# Patient Record
Sex: Male | Born: 1970 | Race: Black or African American | Hispanic: No | Marital: Single | Smoking: Never smoker
Health system: Southern US, Community
[De-identification: ages and names within clinical notes are randomized; demographics above are authoritative.]

## PROBLEM LIST (undated history)

## (undated) HISTORY — PX: JOINT REPLACEMENT: SHX530

---

## 2008-08-09 ENCOUNTER — Emergency Department: Payer: Self-pay | Admitting: Emergency Medicine

## 2008-08-13 ENCOUNTER — Emergency Department: Payer: Self-pay | Admitting: Emergency Medicine

## 2008-09-07 ENCOUNTER — Emergency Department: Payer: Self-pay | Admitting: Emergency Medicine

## 2015-12-03 ENCOUNTER — Emergency Department: Payer: No Typology Code available for payment source

## 2015-12-03 ENCOUNTER — Emergency Department
Admission: EM | Admit: 2015-12-03 | Discharge: 2015-12-03 | Disposition: A | Discharge status: Home / Self Care | Payer: No Typology Code available for payment source | Attending: Emergency Medicine | Admitting: Emergency Medicine

## 2015-12-03 DIAGNOSIS — S8002XA Contusion of left knee, initial encounter: Secondary | ICD-10-CM | POA: Diagnosis not present

## 2015-12-03 DIAGNOSIS — Y9289 Other specified places as the place of occurrence of the external cause: Secondary | ICD-10-CM | POA: Insufficient documentation

## 2015-12-03 DIAGNOSIS — Y9389 Activity, other specified: Secondary | ICD-10-CM | POA: Diagnosis not present

## 2015-12-03 DIAGNOSIS — S39012A Strain of muscle, fascia and tendon of lower back, initial encounter: Secondary | ICD-10-CM | POA: Diagnosis not present

## 2015-12-03 DIAGNOSIS — Y999 Unspecified external cause status: Secondary | ICD-10-CM | POA: Insufficient documentation

## 2015-12-03 DIAGNOSIS — M25562 Pain in left knee: Secondary | ICD-10-CM | POA: Diagnosis present

## 2015-12-03 MED ORDER — DIAZEPAM 2 MG PO TABS: 2.0000 mg | ORAL_TABLET | Freq: Three times a day (TID) | ORAL | Status: AC | PRN

## 2015-12-03 MED ORDER — ACETAMINOPHEN 500 MG PO TABS
1000.0000 mg | ORAL_TABLET | Freq: Once | ORAL | Status: AC
Start: 2015-12-03 — End: 2015-12-03
  Administered 2015-12-03: 1000 mg via ORAL
  Filled 2015-12-03: qty 2

## 2015-12-03 MED ORDER — IBUPROFEN 800 MG PO TABS: 800.0000 mg | ORAL_TABLET | Freq: Three times a day (TID) | ORAL | Status: DC
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
## 2015-12-03 NOTE — Discharge Instructions (Signed)
Ice or moist heat to muscle areas as needed for pain and discomfort. Begin taking ibuprofen for pain and inflammation. Diazepam 2 mg is for muscle spasms every 8 hours for 3 days. This medication could cause drowsiness and is not to be taken while driving. Follow-up with [facility] clinic if any continued problems.

## 2016-08-25 ENCOUNTER — Emergency Department
Admission: EM | Admit: 2016-08-25 | Discharge: 2016-08-25 | Disposition: A | Discharge status: Home / Self Care | Payer: No Typology Code available for payment source | Attending: Emergency Medicine | Admitting: Emergency Medicine

## 2016-08-25 ENCOUNTER — Emergency Department: Payer: No Typology Code available for payment source

## 2016-08-25 ENCOUNTER — Encounter: Payer: Self-pay | Admitting: Emergency Medicine

## 2016-08-25 DIAGNOSIS — Y9389 Activity, other specified: Secondary | ICD-10-CM | POA: Insufficient documentation

## 2016-08-25 DIAGNOSIS — Y92410 Unspecified street and highway as the place of occurrence of the external cause: Secondary | ICD-10-CM | POA: Insufficient documentation

## 2016-08-25 DIAGNOSIS — M545 Low back pain: Secondary | ICD-10-CM | POA: Insufficient documentation

## 2016-08-25 DIAGNOSIS — S3992XA Unspecified injury of lower back, initial encounter: Secondary | ICD-10-CM | POA: Diagnosis present

## 2016-08-25 DIAGNOSIS — Y999 Unspecified external cause status: Secondary | ICD-10-CM | POA: Diagnosis not present

## 2016-08-25 DIAGNOSIS — M25511 Pain in right shoulder: Secondary | ICD-10-CM | POA: Diagnosis not present

## 2016-08-25 MED ORDER — CYCLOBENZAPRINE HCL 5 MG PO TABS: 5.0000 mg | ORAL_TABLET | Freq: Three times a day (TID) | ORAL | 0 refills | Status: AC | PRN

## 2016-08-25 MED ORDER — MELOXICAM 7.5 MG PO TABS: 7.5000 mg | ORAL_TABLET | Freq: Every day | ORAL | 1 refills | Status: AC
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
## 2016-08-25 NOTE — ED Notes (Signed)
Patient transported to X-ray 

## 2017-03-08 ENCOUNTER — Emergency Department: Payer: No Typology Code available for payment source

## 2017-03-08 ENCOUNTER — Emergency Department
Admission: EM | Admit: 2017-03-08 | Discharge: 2017-03-09 | Disposition: A | Discharge status: Home / Self Care | Payer: No Typology Code available for payment source | Attending: Emergency Medicine | Admitting: Emergency Medicine

## 2017-03-08 ENCOUNTER — Encounter: Payer: Self-pay | Admitting: Emergency Medicine

## 2017-03-08 DIAGNOSIS — M79631 Pain in right forearm: Secondary | ICD-10-CM | POA: Diagnosis not present

## 2017-03-08 DIAGNOSIS — T148XXA Other injury of unspecified body region, initial encounter: Secondary | ICD-10-CM | POA: Diagnosis not present

## 2017-03-08 DIAGNOSIS — D171 Benign lipomatous neoplasm of skin and subcutaneous tissue of trunk: Secondary | ICD-10-CM | POA: Diagnosis not present

## 2017-03-08 DIAGNOSIS — S161XXA Strain of muscle, fascia and tendon at neck level, initial encounter: Secondary | ICD-10-CM | POA: Insufficient documentation

## 2017-03-08 DIAGNOSIS — M25561 Pain in right knee: Secondary | ICD-10-CM | POA: Diagnosis not present

## 2017-03-08 DIAGNOSIS — M25572 Pain in left ankle and joints of left foot: Secondary | ICD-10-CM | POA: Diagnosis not present

## 2017-03-08 DIAGNOSIS — Y939 Activity, unspecified: Secondary | ICD-10-CM | POA: Diagnosis not present

## 2017-03-08 DIAGNOSIS — Y92410 Unspecified street and highway as the place of occurrence of the external cause: Secondary | ICD-10-CM | POA: Diagnosis not present

## 2017-03-08 DIAGNOSIS — Y999 Unspecified external cause status: Secondary | ICD-10-CM | POA: Diagnosis not present

## 2017-03-08 DIAGNOSIS — T1490XA Injury, unspecified, initial encounter: Secondary | ICD-10-CM

## 2017-03-08 DIAGNOSIS — S0990XA Unspecified injury of head, initial encounter: Secondary | ICD-10-CM | POA: Diagnosis present

## 2017-03-08 DIAGNOSIS — T07XXXA Unspecified multiple injuries, initial encounter: Secondary | ICD-10-CM

## 2017-03-08 LAB — BASIC METABOLIC PANEL
Anion gap: 8 (ref 5–15)
BUN: 17 mg/dL (ref 6–20)
CHLORIDE: 104 mmol/L (ref 101–111)
CO2: 27 mmol/L (ref 22–32)
CREATININE: 0.91 mg/dL (ref 0.61–1.24)
Calcium: 8.9 mg/dL (ref 8.9–10.3)
GFR calc non Af Amer: >60 mL/min (ref >60)
Glucose, Bld: 113 mg/dL — ABNORMAL HIGH (ref 65–99)
POTASSIUM: 3.7 mmol/L (ref 3.5–5.1)
SODIUM: 139 mmol/L (ref 135–145)

## 2017-03-08 LAB — CBC WITH DIFFERENTIAL/PLATELET
BASOS PCT: 1 %
Basophils Absolute: 0.1 10*3/uL (ref 0–0.1)
EOS ABS: 0.3 10*3/uL (ref 0–0.7)
Eosinophils Relative: 3 %
HCT: 38.5 % — ABNORMAL LOW (ref 40.0–52.0)
HEMOGLOBIN: 13.6 g/dL (ref 13.0–18.0)
Lymphocytes Relative: 29 %
Lymphs Abs: 3.2 10*3/uL (ref 1.0–3.6)
MCH: 31.1 pg (ref 26.0–34.0)
MCHC: 35.4 g/dL (ref 32.0–36.0)
MCV: 88 fL (ref 80.0–100.0)
MONOS PCT: 9 %
Monocytes Absolute: 0.9 10*3/uL (ref 0.2–1.0)
NEUTROS ABS: 6.3 10*3/uL (ref 1.4–6.5)
NEUTROS PCT: 58 %
Platelets: 254 10*3/uL (ref 150–440)
RBC: 4.37 MIL/uL — ABNORMAL LOW (ref 4.40–5.90)
RDW: 13.3 % (ref 11.5–14.5)
WBC: 10.8 10*3/uL — ABNORMAL HIGH (ref 3.8–10.6)

## 2017-03-08 MED ORDER — ONDANSETRON HCL 4 MG/2ML IJ SOLN
4.0000 mg | Freq: Once | INTRAMUSCULAR | Status: AC
  Administered 2017-03-08: 4 mg via INTRAVENOUS
  Filled 2017-03-08: qty 2

## 2017-03-08 MED ORDER — MORPHINE SULFATE (PF) 4 MG/ML IV SOLN
4.0000 mg | Freq: Once | INTRAVENOUS | Status: AC
  Administered 2017-03-08: 4 mg via INTRAVENOUS
  Filled 2017-03-08: qty 1

## 2017-03-08 MED ORDER — SODIUM CHLORIDE 0.9 % IV BOLUS (SEPSIS)
1000.0000 mL | Freq: Once | INTRAVENOUS | Status: AC
Start: 2017-03-08 — End: 2017-03-09
  Administered 2017-03-08: 1000 mL via INTRAVENOUS
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
## 2017-03-09 ENCOUNTER — Encounter: Payer: Self-pay | Admitting: Radiology

## 2017-03-09 MED ORDER — OXYCODONE-ACETAMINOPHEN 5-325 MG PO TABS: 1.0000 | ORAL_TABLET | ORAL | 0 refills | Status: AC | PRN

## 2017-03-09 MED ORDER — IBUPROFEN 800 MG PO TABS: 800.0000 mg | ORAL_TABLET | Freq: Three times a day (TID) | ORAL | 0 refills | Status: AC | PRN

## 2017-03-09 MED ORDER — IOPAMIDOL (ISOVUE-300) INJECTION 61%
100.0000 mL | Freq: Once | INTRAVENOUS | Status: AC | PRN
  Administered 2017-03-09: 100 mL via INTRAVENOUS
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
## 2017-03-09 NOTE — Discharge Instructions (Signed)
1. Take medicines as needed for pain (Motrin/Percocet #15). 2. Apply ice to affected area several times daily. 3. Return to the ER for worsening symptoms, persistent vomiting, difficulty breathing or other concerns.
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
## 2021-01-05 ENCOUNTER — Ambulatory Visit
Admission: EM | Admit: 2021-01-05 | Discharge: 2021-01-05 | Disposition: A | Discharge status: Home / Self Care | Payer: Self-pay

## 2021-01-05 ENCOUNTER — Other Ambulatory Visit: Payer: Self-pay

## 2021-01-05 DIAGNOSIS — L03115 Cellulitis of right lower limb: Secondary | ICD-10-CM

## 2021-01-05 DIAGNOSIS — I739 Peripheral vascular disease, unspecified: Secondary | ICD-10-CM

## 2021-01-05 DIAGNOSIS — L97909 Non-pressure chronic ulcer of unspecified part of unspecified lower leg with unspecified severity: Secondary | ICD-10-CM

## 2021-01-05 MED ORDER — CEPHALEXIN 500 MG PO CAPS: 500.0000 mg | ORAL_CAPSULE | Freq: Four times a day (QID) | ORAL | 0 refills | Status: AC

## 2021-01-05 NOTE — ED Triage Notes (Signed)
Pt sts he have a rash on R ankle. sts the area started out dark around ankle. He was prescribed an abx and cream from [facility] for the area . sts since he has been using the cream the area has began to look worse. There is and open sore on the ankle.

## 2021-01-05 NOTE — Discharge Instructions (Signed)
Take the antibiotic as directed.  Keep your wound clean and dry.  Wash it gently twice a day with soap and water.  Apply an antibiotic cream twice a day.    Schedule an appointment at the wound care center listed below as soon as possible.    Establish a primary care provider as soon as possible.  Assistance from [place] has been requested.

## 2021-01-05 NOTE — ED Provider Notes (Signed)
[facility]    CSN: [id number] Arrival date & time: 01/05/21  1120      History   Chief Complaint Chief Complaint  Patient presents with  . Rash    HPI [name] is a 50 y.o. male.   Patient presents with eye pain wounds on his right lower leg x 2 weeks.  He states the area has had dark skin for several months.  Patient had an appointment with e-visit with [facility] on 12/30/2020 and again today; treated with cephalexin and triamcinolone cream.  He denies fever, chills, numbness, weakness, paresthesias, or other symptoms.  He denies pertinent medical history.  The history is provided by the patient.    History reviewed. No pertinent past medical history.  There are no problems to display for this patient.   Past Surgical History:  Procedure Laterality Date  . JOINT REPLACEMENT         Home Medications    Prior to Admission medications   Medication Sig Start Date End Date Taking? Authorizing Provider  cephALEXin (KEFLEX) 500 MG capsule Take 1 capsule (500 mg total) by mouth 4 (four) times daily for 5 days. 01/05/21 01/10/21 Yes Sharion Balloon, NP  diazepam (VALIUM) 2 MG tablet Take 1 tablet (2 mg total) by mouth every 8 (eight) hours as needed for muscle spasms. 12/03/15   Johnn Hai, PA-C  ibuprofen (ADVIL,MOTRIN) 800 MG tablet Take 1 tablet (800 mg total) by mouth every 8 (eight) hours as needed for moderate pain. 03/09/17   Paulette Blanch, MD  oxyCODONE-acetaminophen (ROXICET) 5-325 MG tablet Take 1 tablet by mouth every 4 (four) hours as needed for severe pain. 03/09/17   Paulette Blanch, MD    Family History No family history on file.  Social History Social History   Tobacco Use  . Smoking status: Never Smoker  . Smokeless tobacco: Never Used  Substance Use Topics  . Alcohol use: No  . Drug use: No     Allergies   Patient has no known allergies.   Review of Systems Review of Systems  Constitutional: Negative for chills and fever.   Respiratory: Negative for cough and shortness of breath.   Cardiovascular: Negative for chest pain and palpitations.  Gastrointestinal: Negative for abdominal pain and vomiting.  Musculoskeletal: Negative for arthralgias and gait problem.  Skin: Positive for color change and wound.  Neurological: Negative for weakness and numbness.  All other systems reviewed and are negative.    Physical Exam Triage Vital Signs ED Triage Vitals  Enc Vitals Group     BP 01/05/21 1129 139/78     Pulse Rate 01/05/21 1129 70     Resp 01/05/21 1129 18     Temp 01/05/21 1129 97.9 F (36.6 C)     Temp Source 01/05/21 1129 Oral     SpO2 01/05/21 1129 98 %     Weight 01/05/21 1130 238 lb (108 kg)     Height 01/05/21 1130 6\' 6"  (1.981 m)     Head Circumference --      Peak Flow --      Pain Score 01/05/21 1130 4     Pain Loc --      Pain Edu? --      Excl. in [place]? --    No data found.  Updated Vital Signs BP 139/78   Pulse 70   Temp 97.9 F (36.6 C) (Oral)   Resp 18   Ht 6\' 6"  (1.981  m)   Wt 238 lb (108 kg)   SpO2 98%   BMI 27.50 kg/m   Visual Acuity Right Eye Distance:   Left Eye Distance:   Bilateral Distance:    Right Eye Near:   Left Eye Near:    Bilateral Near:     Physical Exam Vitals and nursing note reviewed.  Constitutional:      General: He is not in acute distress.    Appearance: He is well-developed.  HENT:     Head: Normocephalic and atraumatic.     Mouth/Throat:     Mouth: Mucous membranes are moist.  Eyes:     Conjunctiva/sclera: Conjunctivae normal.  Cardiovascular:     Rate and Rhythm: Normal rate and regular rhythm.     Heart sounds: Normal heart sounds.  Pulmonary:     Effort: Pulmonary effort is normal. No respiratory distress.     Breath sounds: Normal breath sounds.  Abdominal:     Palpations: Abdomen is soft.     Tenderness: There is no abdominal tenderness.  Musculoskeletal:        General: Normal range of motion.     Cervical back: Neck  supple.  Skin:    General: Skin is warm and dry.     Capillary Refill: Capillary refill takes less than 2 seconds.     Findings: Lesion present.  Neurological:     General: No focal deficit present.     Mental Status: He is alert and oriented to person, place, and time.     Sensory: No sensory deficit.     Motor: No weakness.     Gait: Gait normal.  Psychiatric:        Mood and Affect: Mood normal.        Behavior: Behavior normal.        UC Treatments / Results  Labs (all labs ordered are listed, but only abnormal results are displayed) Labs Reviewed - No data to display  EKG   Radiology No results found.  Procedures Procedures (including critical care time)  Medications Ordered in UC Medications - No data to display  Initial Impression / Assessment and Plan / UC Course  I have reviewed the triage vital signs and the nursing notes.  Pertinent labs & imaging results that were available during my care of the patient were reviewed by me and considered in my medical decision making (see chart for details).   Cellulitis of right lower leg, peripheral vascular disease with ulceration.  Ambulatory referral to wound care center made.  Extending cephalexin for an additional 5 days.  Wound care instructions discussed.  Assistance from [facility] [place] with establishing a PCP requested.  Education provided on cellulitis and PVD.  Patient agrees to plan of care.   Final Clinical Impressions(s) / UC Diagnoses   Final diagnoses:  Cellulitis of leg, right  Peripheral vascular disease of lower extremity with ulceration [facility])     Discharge Instructions     Take the antibiotic as directed.  Keep your wound clean and dry.  Wash it gently twice a day with soap and water.  Apply an antibiotic cream twice a day.    Schedule an appointment at the wound care center listed below as soon as possible.    Establish a primary care provider as soon as possible.  Assistance from [place]  has been requested.        ED Prescriptions    Medication Sig Dispense Auth. Provider   cephALEXin [employer])  500 MG capsule Take 1 capsule (500 mg total) by mouth 4 (four) times daily for 5 days. 20 capsule Sharion Balloon, NP     PDMP not reviewed this encounter.   Sharion Balloon, NP 01/05/21 [id number]

## 2021-01-12 ENCOUNTER — Encounter (HOSPITAL_COMMUNITY): Payer: Self-pay

## 2021-01-12 ENCOUNTER — Other Ambulatory Visit: Payer: Self-pay

## 2021-01-12 ENCOUNTER — Encounter: Payer: Self-pay | Attending: Physician Assistant | Admitting: Physician Assistant

## 2021-01-12 DIAGNOSIS — I872 Venous insufficiency (chronic) (peripheral): Secondary | ICD-10-CM | POA: Insufficient documentation

## 2021-01-12 DIAGNOSIS — L97812 Non-pressure chronic ulcer of other part of right lower leg with fat layer exposed: Secondary | ICD-10-CM | POA: Insufficient documentation

## 2021-01-12 DIAGNOSIS — I10 Essential (primary) hypertension: Secondary | ICD-10-CM | POA: Insufficient documentation

## 2021-01-12 NOTE — Progress Notes (Signed)
[name], [name] ([id number]) Visit Report for 01/12/2021 Abuse/Suicide Risk Screen Details Patient Name: [name], [name]. Date of Service: 01/12/2021 8:30 AM Medical Record Number: [id number] Patient Account Number: [id number] Date of Birth/Sex: March 13, 1971 (50 y.o. M) Treating RN: Dolan Amen Primary Care Janaki Exley: PATIENT, NO Other Clinician: Referring Shada Nienaber: Barkley Boards Treating [place] Whedbee/Extender: Skipper Cliche in Treatment: 0 Abuse/Suicide Risk Screen Items Answer ABUSE RISK SCREEN: Has anyone close to you tried to hurt or harm you recentlyo No Do you feel uncomfortable with anyone in your familyo No Has anyone forced you do things that you didnot want to doo No Electronic Signature(s) Signed: 01/12/2021 5:00:05 PM By: Georges Mouse, Minus Breeding RN Entered By: Georges Mouse, Kenia on 01/12/2021 08:53:21 [place], [place] ([id number]) -------------------------------------------------------------------------------- Activities of Daily Living Details Patient Name: [name], [name] Date of Service: 01/12/2021 8:30 AM Medical Record Number: [id number] Patient Account Number: [id number] Date of Birth/Sex: 1971-06-07 (50 y.o. M) Treating RN: Dolan Amen Primary Care Seth Higginbotham: PATIENT, NO Other Clinician: Referring Sharee Sturdy: Barkley Boards Treating Crosley Stejskal/Extender: Skipper Cliche in Treatment: 0 Activities of Daily Living Items Answer Activities of Daily Living (Please select one for each item) Drive Automobile Completely Able Take Medications Completely Able Use Telephone Completely Able Care for Appearance Completely Able Use Toilet Completely Able Bath / Shower Completely Able Dress Self Completely Able Feed Self Completely Able Walk Completely Able Get In / Out Bed Completely Able Housework Completely Able Prepare Meals Completely Able Handle Money Completely Able Shop for Self Completely Able Electronic Signature(s) Signed: 01/12/2021 5:00:05 PM By: Georges Mouse, Minus Breeding RN Entered By: Georges Mouse, Minus Breeding on 01/12/2021 08:53:37 [place], [place] Carlean Jews ([id number]) -------------------------------------------------------------------------------- Education Screening Details Patient Name: Nicolas Cousin L. Date of Service: 01/12/2021 8:30 AM Medical Record Number: [id number] Patient Account Number: [id number] Date of Birth/Sex: 12-12-1970 (50 y.o. M) Treating RN: Dolan Amen Primary Care Torri Michalski: PATIENT, NO Other Clinician: Referring Dejon Lukas: Barkley Boards Treating Ragnar Waas/Extender: Skipper Cliche in Treatment: 0 Primary Learner Assessed: Patient Learning Preferences/Education Level/Primary Language Learning Preference: Explanation, Demonstration Highest Education Level: High School Preferred Language: English Cognitive Barrier Language Barrier: No Translator Needed: No Memory Deficit: No Emotional Barrier: No Cultural/Religious Beliefs Affecting Medical Care: No Physical Barrier Impaired Vision: No Impaired Hearing: No Decreased Hand dexterity: No Knowledge/Comprehension Knowledge Level: Medium Comprehension Level: Medium Ability to understand written instructions: Medium Ability to understand verbal instructions: Medium Motivation Anxiety Level: Calm Cooperation: Cooperative Education Importance: Acknowledges Need Interest in Health Problems: Asks Questions Perception: Coherent Willingness to Engage in Self-Management High Activities: Readiness to Engage in Self-Management High Activities: Electronic Signature(s) Signed: 01/12/2021 5:00:05 PM By: Georges Mouse, Minus Breeding RN Entered By: Georges Mouse, Minus Breeding on 01/12/2021 08:54:13 [name] ([id number]) -------------------------------------------------------------------------------- Fall Risk Assessment Details Patient Name: Nicolas Cousin L. Date of Service: 01/12/2021 8:30 AM Medical Record Number: [id number] Patient Account Number: [id number] Date of Birth/Sex:  July 26, 1971 (50 y.o. M) Treating RN: Dolan Amen Primary Care Rande Roylance: PATIENT, NO Other Clinician: Referring Nijel Flink: Barkley Boards Treating Keatyn Luck/Extender: Skipper Cliche in Treatment: 0 Fall Risk Assessment Items Have you had 2 or more falls in the last 12 monthso 0 No Have you had any fall that resulted in injury in the last 12 monthso 0 No FALLS RISK SCREEN History of falling - immediate or within 3 months 0 No Secondary diagnosis (Do you have 2 or more medical diagnoseso) 0 No Ambulatory aid None/bed rest/wheelchair/nurse 0 Yes Crutches/cane/walker 0 No Furniture 0 No Intravenous therapy Access/Saline/Heparin Lock 0 No Gait/Transferring Normal/ bed rest/ wheelchair  0 Yes Weak (short steps with or without shuffle, stooped but able to lift head while walking, may 0 No seek support from furniture) Impaired (short steps with shuffle, may have difficulty arising from chair, head down, impaired 0 No balance) Mental Status Oriented to own ability 0 Yes Electronic Signature(s) Signed: 01/12/2021 5:00:05 PM By: Georges Mouse, Minus Breeding RN Entered By: Georges Mouse, Minus Breeding on 01/12/2021 08:54:24 [name], [name] ([id number]) -------------------------------------------------------------------------------- Foot Assessment Details Patient Name: Nicolas Cousin L. Date of Service: 01/12/2021 8:30 AM Medical Record Number: [id number] Patient Account Number: [id number] Date of Birth/Sex: 02-02-1971 (50 y.o. M) Treating RN: Dolan Amen Primary Care [name]: PATIENT, NO Other Clinician: Referring [name]: Barkley Boards Treating Laniah Grimm/Extender: Skipper Cliche in Treatment: 0 Foot Assessment Items Site Locations + = Sensation present, - = Sensation absent, C = Callus, U = Ulcer R = Redness, W = Warmth, M = Maceration, PU = Pre-ulcerative lesion F = Fissure, S = Swelling, D = Dryness Assessment Right: Left: Other Deformity: No No Prior Foot Ulcer: No No Prior Amputation:  No No Charcot Joint: No No Ambulatory Status: Ambulatory Without Help Gait: Steady Electronic Signature(s) Signed: 01/12/2021 5:00:05 PM By: Georges Mouse, Minus Breeding RN Entered By: Georges Mouse, Minus Breeding on 01/12/2021 08:54:44 Thayer, [place] ([id number]) -------------------------------------------------------------------------------- Nutrition Risk Screening Details Patient Name: Nicolas Cousin L. Date of Service: 01/12/2021 8:30 AM Medical Record Number: [id number] Patient Account Number: [id number] Date of Birth/Sex: [birth year] (50 y.o. M) Treating RN: Dolan Amen Primary Care Abrielle Finck: PATIENT, NO Other Clinician: Referring Quade Ramirez: Barkley Boards Treating Minoru Chap/Extender: Skipper Cliche in Treatment: 0 Height (in): 78 Weight (lbs): 249 Body Mass Index (BMI): 28.8 Nutrition Risk Screening Items Score Screening NUTRITION RISK SCREEN: I have an illness or condition that made me change the kind and/or amount of food I eat 0 No I eat fewer than two meals per day 0 No I eat few fruits and vegetables, or milk products 0 No I have three or more drinks of beer, liquor or wine almost every day 0 No I have tooth or mouth problems that make it hard for me to eat 0 No I don't always have enough money to buy the food I need 0 No I eat alone most of the time 0 No I take three or more different prescribed or over-the-counter drugs a day 0 No Without wanting to, I have lost or gained 10 pounds in the last six months 0 No I am not always physically able to shop, cook and/or feed myself 0 No Nutrition Protocols Good Risk Protocol 0 No interventions needed Moderate Risk Protocol High Risk Proctocol Risk Level: Good Risk Score: 0 Electronic Signature(s) Signed: 01/12/2021 5:00:05 PM By: Georges Mouse, Minus Breeding RN Entered By: Georges Mouse, Minus Breeding on 01/12/2021 08:54:33

## 2021-01-15 NOTE — Progress Notes (Signed)
[name], Rc L. ([id number]) Visit Report for 01/12/2021 Chief Complaint Document Details Patient Name: [name], [name] Date of Service: 01/12/2021 8:30 AM Medical Record Number: [id number] Patient Account Number: [id number] Date of Birth/Sex: [birth year] (50 y.o. M) Treating RN: Yevonne PaxEpps, Carrie Primary Care Provider: PATIENT, NO Other Clinician: Referring Provider: Wendee Beaversate, Kelly Treating Provider/Extender: Rowan BlaseStone, Chrishon Martino Weeks in Treatment: 0 Information Obtained from: Patient Chief Complaint Right LE Ulcer Electronic Signature(s) Signed: 01/12/2021 9:28:38 AM By: Lenda KelpStone III, Rhylee Pucillo PA-C Entered By: Lenda KelpStone III, Kennieth Plotts on 01/12/2021 08:65:[id number]:28:38 [name], Edker L. ([id number]) -------------------------------------------------------------------------------- HPI Details Patient Name: [name], Hitesh L. Date of Service: 01/12/2021 8:30 AM Medical Record Number: [id number] Patient Account Number: [id number] Date of Birth/Sex: [birth year] (50 y.o. M) Treating RN: Yevonne PaxEpps, Carrie Primary Care Provider: PATIENT, NO Other Clinician: Referring Provider: Wendee Beaversate, Kelly Treating Provider/Extender: Rowan BlaseStone, Danta Baumgardner Weeks in Treatment: 0 History of Present Illness HPI Description: 01/12/2021 upon evaluation today patient appears to be doing somewhat poorly in regard to a wound on his right medial lower leg. Currently he tells me that this has been going on since around the beginning of April. He has done a telehealth visit with a physician who initially gave him a triamcinolone cream along with Keflex for 5 days. Subsequently he ended up being seen at walk-in urgent care where they also given Keflex for 7 days that seem to be doing better for him. Overall he tells me that things have improved from the standpoint of redness. Fortunately there does not appear to be any signs of systemic infection to be honest right now even locally I do not see any evidence of infection right now. He does have 1 main wound with some scattering areas that  look like they may have been small ulcerations but again for the most part these have filled in. This does look almost to be more of a vasculitis type scenario based on what I am seeing. Patient does have chronic venous insufficiency but is not wearing any compression even though it sounds like he has been told before he should. Electronic Signature(s) Signed: 01/12/2021 5:13:30 PM By: Lenda KelpStone III, Khamari Yousuf PA-C Entered By: Lenda KelpStone III, Yuchen Fedor on 01/12/2021 17:13:30 [name], Masayuki L. ([id number]) -------------------------------------------------------------------------------- Physical Exam Details Patient Name: [name], [name] Date of Service: 01/12/2021 8:30 AM Medical Record Number: [id number] Patient Account Number: [id number] Date of Birth/Sex: [birth year] (50 y.o. M) Treating RN: Yevonne PaxEpps, Carrie Primary Care Provider: PATIENT, NO Other Clinician: Referring Provider: Wendee Beaversate, Kelly Treating Provider/Extender: Rowan BlaseStone, Geral Tuch Weeks in Treatment: 0 Constitutional sitting or standing blood pressure is within target range for patient.. pulse regular and within target range for patient.Marland Kitchen. respirations regular, non- labored and within target range for patient.Marland Kitchen. temperature within target range for patient.. Well-nourished and well-hydrated in no acute distress. Eyes conjunctiva clear no eyelid edema noted. pupils equal round and reactive to light and accommodation. Ears, Nose, Mouth, and Throat no gross abnormality of ear auricles or external auditory canals. normal hearing noted during conversation. mucus membranes moist. Respiratory normal breathing without difficulty. Cardiovascular 2+ dorsalis pedis/posterior tibialis pulses. 1+ pitting edema of the bilateral lower extremities. Musculoskeletal normal gait and posture. no significant deformity or arthritic changes, no loss or range of motion, no clubbing. Psychiatric this patient is able to make decisions and demonstrates good insight into disease process.  Alert and Oriented x 3. pleasant and cooperative. Notes Upon inspection patient's wound actually showed some signs of being potentially a vasculitis type scenario. Fortunately there does not appear to be any evidence of infection right now  I think the Keflex has done its job as far as that is concerned. Fortunately I think that he is making good progress in general but I believe he will do better with appropriate compression. In fact I think compression therapy would likely be the ideal thing to get this movement in the right direction. Electronic Signature(s) Signed: 01/12/2021 5:14:08 PM By: Worthy Keeler PA-C Entered By: Worthy Keeler on 01/12/2021 17:14:08 [name] ([id number]) -------------------------------------------------------------------------------- Physician Orders Details Patient Name: Nicolas, [name] Date of Service: 01/12/2021 8:30 AM Medical Record Number: [id number] Patient Account Number: [id number] Date of Birth/Sex: [birth year] (50 y.o. M) Treating RN: Carlene Coria Primary Care Provider: PATIENT, NO Other Clinician: Referring Provider: Barkley Boards Treating Provider/Extender: Skipper Cliche in Treatment: 0 Verbal / Phone Orders: No Diagnosis Coding ICD-10 Coding Code Description I87.2 Venous insufficiency (chronic) (peripheral) L97.812 Non-pressure chronic ulcer of other part of right lower leg with fat layer exposed Follow-up Appointments o Return Appointment in 1 week. Bathing/ Shower/ Hygiene o May shower with wound dressing protected with water repellent cover or cast protector. Edema Control - Lymphedema / Segmental Compressive Device / Other o Optional: One layer of unna paste to top of compression wrap (to act as an anchor). o Elevate, Exercise Daily and Avoid Standing for Long Periods of Time. o Elevate legs to the level of the heart and pump ankles as often as possible o Elevate leg(s) parallel to the floor when sitting. Wound  Treatment Wound #1 - Lower Leg Wound Laterality: Right, Medial Cleanser: Soap and Water 1 x Per Week/30 Days Discharge Instructions: Gently cleanse wound with antibacterial soap, rinse and pat dry prior to dressing wounds Topical: Triamcinolone Acetonide Cream, 0.1%, 15 (g) tube 1 x Per Week/30 Days Discharge Instructions: Apply as directed by provider. apply to wound bed Primary Dressing: Hydrofera Blue Ready Transfer Foam, 2.5x2.5 (in/in) 1 x Per Week/30 Days Discharge Instructions: Apply Hydrofera Blue Ready to wound bed as directed Secondary Dressing: ABD Pad 5x9 (in/in) 1 x Per Week/30 Days Discharge Instructions: Cover with ABD pad Compression Wrap: Profore Lite LF 3 Multilayer Compression Bandaging System 1 x Per Week/30 Days Discharge Instructions: Apply 3 multi-layer wrap as prescribed. Electronic Signature(s) Signed: 01/12/2021 4:32:16 PM By: Worthy Keeler PA-C Signed: 01/15/2021 2:18:56 PM By: Carlene Coria RN Entered By: Carlene Coria on 01/12/2021 09:42:57 Gorsline, Osmar LMarland Kitchen ([id number]) -------------------------------------------------------------------------------- Problem List Details Patient Name: OZAN, MACLAY L. Date of Service: 01/12/2021 8:30 AM Medical Record Number: [id number] Patient Account Number: [id number] Date of Birth/Sex: 1970/12/03 (50 y.o. M) Treating RN: Carlene Coria Primary Care Provider: PATIENT, NO Other Clinician: Referring Provider: Barkley Boards Treating Provider/Extender: Skipper Cliche in Treatment: 0 Active Problems ICD-10 Encounter Code Description Active Date MDM Diagnosis I87.2 Venous insufficiency (chronic) (peripheral) 01/12/2021 No Yes L97.812 Non-pressure chronic ulcer of other part of right lower leg with fat layer 01/12/2021 No Yes exposed Inactive Problems Resolved Problems Electronic Signature(s) Signed: 01/12/2021 9:28:17 AM By: Worthy Keeler PA-C Entered By: Worthy Keeler on 01/12/2021 09:28:17 Kassis, Coltan L.  ([id number]) -------------------------------------------------------------------------------- Progress Note Details Patient Name: [name] Date of Service: 01/12/2021 8:30 AM Medical Record Number: [id number] Patient Account Number: [id number] Date of Birth/Sex: Mar 24, 1971 (50 y.o. M) Treating RN: Carlene Coria Primary Care Provider: PATIENT, NO Other Clinician: Referring Provider: Barkley Boards Treating Provider/Extender: Skipper Cliche in Treatment: 0 Subjective Chief Complaint Information obtained from Patient Right LE Ulcer History of Present Illness (HPI) 01/12/2021 upon evaluation today patient appears to be  doing somewhat poorly in regard to a wound on his right medial lower leg. Currently he tells me that this has been going on since around the beginning of April. He has done a telehealth visit with a physician who initially gave him a triamcinolone cream along with Keflex for 5 days. Subsequently he ended up being seen at walk-in urgent care where they also given Keflex for 7 days that seem to be doing better for him. Overall he tells me that things have improved from the standpoint of redness. Fortunately there does not appear to be any signs of systemic infection to be honest right now even locally I do not see any evidence of infection right now. He does have 1 main wound with some scattering areas that look like they may have been small ulcerations but again for the most part these have filled in. This does look almost to be more of a vasculitis type scenario based on what I am seeing. Patient does have chronic venous insufficiency but is not wearing any compression even though it sounds like he has been told before he should. Patient History Allergies No Known Drug Allergies Social History Never smoker, Marital Status - Married, Alcohol Use - Never, Drug Use - No History, Caffeine Use - Never. Medical History Eyes Denies history of Cataracts, Glaucoma, Optic  Neuritis Ear/Nose/Mouth/Throat Denies history of Chronic sinus problems/congestion, Middle ear problems Hematologic/Lymphatic Denies history of Anemia, Hemophilia, Human Immunodeficiency Virus, Lymphedema, Sickle Cell Disease Respiratory Denies history of Aspiration, Asthma, Chronic Obstructive Pulmonary Disease (COPD), Pneumothorax, Sleep Apnea, Tuberculosis Cardiovascular Denies history of Angina, Arrhythmia, Congestive Heart Failure, Coronary Artery Disease, Deep Vein Thrombosis, Hypertension, Hypotension, Myocardial Infarction, Peripheral Arterial Disease, Peripheral Venous Disease, Phlebitis, Vasculitis Gastrointestinal Denies history of Cirrhosis , Colitis, Crohn s, Hepatitis A, Hepatitis B, Hepatitis C Endocrine Denies history of Type I Diabetes, Type II Diabetes Genitourinary Denies history of End Stage Renal Disease Immunological Denies history of Lupus Erythematosus, Raynaud s, Scleroderma Integumentary (Skin) Denies history of History of Burn, History of pressure wounds Musculoskeletal Denies history of Gout, Rheumatoid Arthritis, Osteoarthritis, Osteomyelitis Neurologic Denies history of Dementia, Neuropathy, Quadriplegia, Paraplegia, Seizure Disorder Oncologic Denies history of Received Chemotherapy, Received Radiation Psychiatric Denies history of Anorexia/bulimia, Confinement Anxiety Review of Systems (ROS) Eyes Denies complaints or symptoms of Dry Eyes, Vision Changes, Glasses / Contacts. Ear/Nose/Mouth/Throat Denies complaints or symptoms of Difficult clearing ears, Sinusitis. Hematologic/Lymphatic Denies complaints or symptoms of Bleeding / Clotting Disorders, Human Immunodeficiency Virus. Respiratory Denies complaints or symptoms of Chronic or frequent coughs, Shortness of Breath. Cardiovascular Complains or has symptoms of LE edema. LIVINGSTONE, Conlin L. ([id number]) Denies complaints or symptoms of Chest pain. Gastrointestinal Denies complaints or symptoms of  Frequent diarrhea, Nausea, Vomiting. Endocrine Denies complaints or symptoms of Hepatitis, Thyroid disease, Polydypsia (Excessive Thirst). Genitourinary Denies complaints or symptoms of Kidney failure/ Dialysis, Incontinence/dribbling. Immunological Denies complaints or symptoms of Hives, Itching. Integumentary (Skin) Complains or has symptoms of Wounds, Swelling. Denies complaints or symptoms of Bleeding or bruising tendency, Breakdown. Musculoskeletal Denies complaints or symptoms of Muscle Pain, Muscle Weakness. Neurologic Denies complaints or symptoms of Numbness/parasthesias, Focal/Weakness. Psychiatric Denies complaints or symptoms of Anxiety, Claustrophobia. Objective Constitutional sitting or standing blood pressure is within target range for patient.. pulse regular and within target range for patient.Marland Kitchen respirations regular, non- labored and within target range for patient.Marland Kitchen temperature within target range for patient.. Well-nourished and well-hydrated in no acute distress. Vitals Time Taken: 8:48 AM, Height: 78 in, Source: Stated, Weight: 249 lbs, Source: Measured,  BMI: 28.8, Temperature: 98.3 F, Pulse: 66 bpm, Respiratory Rate: 18 breaths/min, Blood Pressure: 130/77 mmHg. Eyes conjunctiva clear no eyelid edema noted. pupils equal round and reactive to light and accommodation. Ears, Nose, Mouth, and Throat no gross abnormality of ear auricles or external auditory canals. normal hearing noted during conversation. mucus membranes moist. Respiratory normal breathing without difficulty. Cardiovascular 2+ dorsalis pedis/posterior tibialis pulses. 1+ pitting edema of the bilateral lower extremities. Musculoskeletal normal gait and posture. no significant deformity or arthritic changes, no loss or range of motion, no clubbing. Psychiatric this patient is able to make decisions and demonstrates good insight into disease process. Alert and Oriented x 3. pleasant and  cooperative. General Notes: Upon inspection patient's wound actually showed some signs of being potentially a vasculitis type scenario. Fortunately there does not appear to be any evidence of infection right now I think the Keflex has done its job as far as that is concerned. Fortunately I think that he is making good progress in general but I believe he will do better with appropriate compression. In fact I think compression therapy would likely be the ideal thing to get this movement in the right direction. Integumentary (Hair, Skin) Wound #1 status is Open. Original cause of wound was Gradually Appeared. The date acquired was: 11/24/2020. The wound is located on the Right,Medial Lower Leg. The wound measures 0.9cm length x 1.2cm width x 0.2cm depth; 0.848cm^2 area and 0.17cm^3 volume. There is Fat Layer (Subcutaneous Tissue) exposed. There is no tunneling or undermining noted. There is a medium amount of serosanguineous drainage noted. The wound margin is flat and intact. There is no granulation within the wound bed. There is a large (67-100%) amount of necrotic tissue within the wound bed including Adherent Slough. Assessment Active Problems ICD-10 Venous insufficiency (chronic) (peripheral) Schmutz, Brookes L. ([id number]) Non-pressure chronic ulcer of other part of right lower leg with fat layer exposed Procedures Wound #1 Pre-procedure diagnosis of Wound #1 is a Venous Leg Ulcer located on the Right,Medial Lower Leg . There was a Three Layer Compression Therapy Procedure by Carlene Coria, RN. Post procedure Diagnosis Wound #1: Same as Pre-Procedure Plan Follow-up Appointments: Return Appointment in 1 week. Bathing/ Shower/ Hygiene: May shower with wound dressing protected with water repellent cover or cast protector. Edema Control - Lymphedema / Segmental Compressive Device / Other: Optional: One layer of unna paste to top of compression wrap (to act as an anchor). Elevate, Exercise  Daily and Avoid Standing for Long Periods of Time. Elevate legs to the level of the heart and pump ankles as often as possible Elevate leg(s) parallel to the floor when sitting. WOUND #1: - Lower Leg Wound Laterality: Right, Medial Cleanser: Soap and Water 1 x Per Week/30 Days Discharge Instructions: Gently cleanse wound with antibacterial soap, rinse and pat dry prior to dressing wounds Topical: Triamcinolone Acetonide Cream, 0.1%, 15 (g) tube 1 x Per Week/30 Days Discharge Instructions: Apply as directed by provider. apply to wound bed Primary Dressing: Hydrofera Blue Ready Transfer Foam, 2.5x2.5 (in/in) 1 x Per Week/30 Days Discharge Instructions: Apply Hydrofera Blue Ready to wound bed as directed Secondary Dressing: ABD Pad 5x9 (in/in) 1 x Per Week/30 Days Discharge Instructions: Cover with ABD pad Compression Wrap: Profore Lite LF 3 Multilayer Compression Bandaging System 1 x Per Week/30 Days Discharge Instructions: Apply 3 multi-layer wrap as prescribed. 1. Would recommend at this time that we actually go ahead and initiate treatment with topical triamcinolone to the wound bed. I think this will  help with some of the inflammatory component that I am seeing here on physical exam. 2. We will use Hydrofera Blue to cover. 3. We will use an ABD pad over top of this. 4. I would recommend a 3 layer compression wrap to help with edema control I think this should be sufficient to keep him under control as far as the swelling is concerned to allow this to most effective healing if possible. 5. I do not see any evidence of infection right now would not think he requires any additional antibiotics although we will keep an eye on things in that regard. We will see patient back for reevaluation in 1 week here in the clinic. If anything worsens or changes patient will contact our office for additional recommendations. Electronic Signature(s) Signed: 01/12/2021 5:15:20 PM By: Worthy Keeler  PA-C Entered By: Worthy Keeler on 01/12/2021 17:15:19 Cletus Gash, Johnpatrick Carlean Jews ([id number]) -------------------------------------------------------------------------------- ROS/PFSH Details Patient Name: LEJUAN, SHELBURN L. Date of Service: 01/12/2021 8:30 AM Medical Record Number: [id number] Patient Account Number: [id number] Date of Birth/Sex: 05/17/1971 (51 y.o. M) Treating RN: Dolan Amen Primary Care Provider: PATIENT, NO Other Clinician: Referring Provider: Barkley Boards Treating Provider/Extender: Skipper Cliche in Treatment: 0 Eyes Complaints and Symptoms: Negative for: Dry Eyes; Vision Changes; Glasses / Contacts Medical History: Negative for: Cataracts; Glaucoma; Optic Neuritis Ear/Nose/Mouth/Throat Complaints and Symptoms: Negative for: Difficult clearing ears; Sinusitis Medical History: Negative for: Chronic sinus problems/congestion; Middle ear problems Hematologic/Lymphatic Complaints and Symptoms: Negative for: Bleeding / Clotting Disorders; Human Immunodeficiency Virus Medical History: Negative for: Anemia; Hemophilia; Human Immunodeficiency Virus; Lymphedema; Sickle Cell Disease Respiratory Complaints and Symptoms: Negative for: Chronic or frequent coughs; Shortness of Breath Medical History: Negative for: Aspiration; Asthma; Chronic Obstructive Pulmonary Disease (COPD); Pneumothorax; Sleep Apnea; Tuberculosis Cardiovascular Complaints and Symptoms: Positive for: LE edema Negative for: Chest pain Medical History: Negative for: Angina; Arrhythmia; Congestive Heart Failure; Coronary Artery Disease; Deep Vein Thrombosis; Hypertension; Hypotension; Myocardial Infarction; Peripheral Arterial Disease; Peripheral Venous Disease; Phlebitis; Vasculitis Gastrointestinal Complaints and Symptoms: Negative for: Frequent diarrhea; Nausea; Vomiting Medical History: Negative for: Cirrhosis ; Colitis; Crohnos; Hepatitis A; Hepatitis B; Hepatitis C Endocrine Complaints and  Symptoms: Negative for: Hepatitis; Thyroid disease; Polydypsia (Excessive Thirst) Medical History: Negative for: Type I Diabetes; Type II Diabetes Genitourinary Complaints and Symptoms: Negative for: Kidney failure/ Dialysis; Incontinence/dribbling [place], [place] ([id number]) Medical History: Negative for: End Stage Renal Disease Immunological Complaints and Symptoms: Negative for: Hives; Itching Medical History: Negative for: Lupus Erythematosus; Raynaudos; Scleroderma Integumentary (Skin) Complaints and Symptoms: Positive for: Wounds; Swelling Negative for: Bleeding or bruising tendency; Breakdown Medical History: Negative for: History of Burn; History of pressure wounds Musculoskeletal Complaints and Symptoms: Negative for: Muscle Pain; Muscle Weakness Medical History: Negative for: Gout; Rheumatoid Arthritis; Osteoarthritis; Osteomyelitis Neurologic Complaints and Symptoms: Negative for: Numbness/parasthesias; Focal/Weakness Medical History: Negative for: Dementia; Neuropathy; Quadriplegia; Paraplegia; Seizure Disorder Psychiatric Complaints and Symptoms: Negative for: Anxiety; Claustrophobia Medical History: Negative for: Anorexia/bulimia; Confinement Anxiety Oncologic Medical History: Negative for: Received Chemotherapy; Received Radiation Immunizations Pneumococcal Vaccine: Received Pneumococcal Vaccination: No Implantable Devices No devices added Family and Social History Never smoker; Marital Status - Married; Alcohol Use: Never; Drug Use: No History; Caffeine Use: Never Electronic Signature(s) Signed: 01/12/2021 4:32:16 PM By: Worthy Keeler PA-C Signed: 01/12/2021 5:00:05 PM By: Georges Mouse, Minus Breeding RN Entered By: Georges Mouse, Minus Breeding on 01/12/2021 08:53:17 [place], Amirr Carlean Jews ([id number]) -------------------------------------------------------------------------------- SuperBill Details Patient Name: [name] Date of Service: 01/12/2021 Medical  Record Number: [id number] Patient Account Number: [id number]  Date of Birth/Sex: 1970/09/12 (50 y.o. M) Treating RN: Carlene Coria Primary Care Provider: PATIENT, NO Other Clinician: Referring Provider: Barkley Boards Treating Provider/Extender: Skipper Cliche in Treatment: 0 Diagnosis Coding ICD-10 Codes Code Description I87.2 Venous insufficiency (chronic) (peripheral) L97.812 Non-pressure chronic ulcer of other part of right lower leg with fat layer exposed Facility Procedures CPT4 Code: [id number] Description: [place] VISIT-LEV 3 EST PT Modifier: Quantity: 1 Physician Procedures CPT4 Code: [id number] Description: [facility] PHYS LEVEL 3 o NEW PT Modifier: Quantity: 1 CPT4 Code: Description: ICD-10 Diagnosis Description I87.2 Venous insufficiency (chronic) (peripheral) L97.812 Non-pressure chronic ulcer of other part of right lower leg with fat Modifier: layer exposed Quantity: Electronic Signature(s) Signed: 01/12/2021 5:15:32 PM By: Worthy Keeler PA-C Previous Signature: 01/12/2021 4:32:16 PM Version By: Worthy Keeler PA-C Entered By: Worthy Keeler on 01/12/2021 17:15:32

## 2021-01-15 NOTE — Progress Notes (Signed)
ANMOL, FLECK ([id number]) Visit Report for 01/12/2021 Allergy List Details Patient Name: [name], [name]. Date of Service: 01/12/2021 8:30 AM Medical Record Number: [id number] Patient Account Number: [id number] Date of Birth/Sex: 05-09-1971 (50 y.o. M) Treating RN: Dolan Amen Primary Care Trisha Morandi: PATIENT, NO Other Clinician: Referring Reveca Desmarais: Barkley Boards Treating Sheilla Maris/Extender: Skipper Cliche in Treatment: 0 Allergies Active Allergies No Known Drug Allergies Allergy Notes Electronic Signature(s) Signed: 01/12/2021 5:00:05 PM By: Georges Mouse, Minus Breeding RN Entered By: Georges Mouse, Minus Breeding on 01/12/2021 08:51:47 [name] ([id number]) -------------------------------------------------------------------------------- Arrival Information Details Patient Name: [name], [name] Date of Service: 01/12/2021 8:30 AM Medical Record Number: [id number] Patient Account Number: [id number] Date of Birth/Sex: Jul 24, 1971 (50 y.o. M) Treating RN: Dolan Amen Primary Care Staton Markey: PATIENT, NO Other Clinician: Referring Cristen Murcia: Barkley Boards Treating Stefanie Hodgens/Extender: Skipper Cliche in Treatment: 0 Visit Information Patient Arrived: Ambulatory Arrival Time: 08:41 Accompanied By: self Transfer Assistance: None Patient Identification Verified: Yes Secondary Verification Process Completed: Yes Patient Has Alerts: Yes Patient Alerts: NOT DIABETIC Electronic Signature(s) Signed: 01/12/2021 5:00:05 PM By: Georges Mouse, Minus Breeding RN Entered By: Georges Mouse, Minus Breeding on 01/12/2021 10:37:47 [name] ([id number]) -------------------------------------------------------------------------------- Clinic Level of Care Assessment Details Patient Name: [name], [name] Date of Service: 01/12/2021 8:30 AM Medical Record Number: [id number] Patient Account Number: [id number] Date of Birth/Sex: March 31, 1971 (50 y.o. M) Treating RN: Carlene Coria Primary Care Nayelie Gionfriddo: PATIENT, NO  Other Clinician: Referring Bettyjo Lundblad: Barkley Boards Treating Xaviera Flaten/Extender: Skipper Cliche in Treatment: 0 Clinic Level of Care Assessment Items TOOL 2 Quantity Score X - Use when only an EandM is performed on the INITIAL visit 1 0 ASSESSMENTS - Nursing Assessment / Reassessment X - General Physical Exam (combine w/ comprehensive assessment (listed just below) when performed on new 1 20 pt. evals) X- 1 25 Comprehensive Assessment (HX, ROS, Risk Assessments, Wounds Hx, etc.) ASSESSMENTS - Wound and Skin Assessment / Reassessment X - Simple Wound Assessment / Reassessment - one wound 1 5 []  - 0 Complex Wound Assessment / Reassessment - multiple wounds []  - 0 Dermatologic / Skin Assessment (not related to wound area) ASSESSMENTS - Ostomy and/or Continence Assessment and Care []  - Incontinence Assessment and Management 0 []  - 0 Ostomy Care Assessment and Management (repouching, etc.) PROCESS - Coordination of Care X - Simple Patient / Family Education for ongoing care 1 15 []  - 0 Complex (extensive) Patient / Family Education for ongoing care []  - 0 Staff obtains Programmer, systems, Records, Test Results / Process Orders []  - 0 Staff telephones HHA, Nursing Homes / Clarify orders / etc []  - 0 Routine Transfer to another Facility (non-emergent condition) []  - 0 Routine Hospital Admission (non-emergent condition) []  - 0 New Admissions / Biomedical engineer / Ordering NPWT, Apligraf, etc. []  - 0 Emergency Hospital Admission (emergent condition) X- 1 10 Simple Discharge Coordination []  - 0 Complex (extensive) Discharge Coordination PROCESS - Special Needs []  - Pediatric / Minor Patient Management 0 []  - 0 Isolation Patient Management []  - 0 Hearing / Language / Visual special needs []  - 0 Assessment of Community assistance (transportation, D/C planning, etc.) []  - 0 Additional assistance / Altered mentation []  - 0 Support Surface(s) Assessment (bed, cushion, seat,  etc.) INTERVENTIONS - Wound Cleansing / Measurement X - Wound Imaging (photographs - any number of wounds) 1 5 []  - 0 Wound Tracing (instead of photographs) X- 1 5 Simple Wound Measurement - one wound []  - 0 Complex Wound Measurement - multiple wounds [name], [name] ([id number]) X-  1 5 Simple Wound Cleansing - one wound []  - 0 Complex Wound Cleansing - multiple wounds INTERVENTIONS - Wound Dressings X - Small Wound Dressing one or multiple wounds 1 10 []  - 0 Medium Wound Dressing one or multiple wounds []  - 0 Large Wound Dressing one or multiple wounds []  - 0 Application of Medications - injection INTERVENTIONS - Miscellaneous []  - External ear exam 0 []  - 0 Specimen Collection (cultures, biopsies, blood, body fluids, etc.) []  - 0 Specimen(s) / Culture(s) sent or taken to Lab for analysis []  - 0 Patient Transfer (multiple staff / Civil Service fast streamer / Similar devices) []  - 0 Simple Staple / Suture removal (25 or less) []  - 0 Complex Staple / Suture removal (26 or more) []  - 0 Hypo / Hyperglycemic Management (close monitor of Blood Glucose) X- 1 15 Ankle / Brachial Index (ABI) - do not check if billed separately Has the patient been seen at the hospital within the last three years: Yes Total Score: 115 Level Of Care: New/Established - Level 3 Electronic Signature(s) Signed: 01/15/2021 2:18:56 PM By: Carlene Coria RN Entered By: Carlene Coria on 01/12/2021 09:45:19 [name], [name] ([id number]) -------------------------------------------------------------------------------- Compression Therapy Details Patient Name: Nicolas Cousin L. Date of Service: 01/12/2021 8:30 AM Medical Record Number: [id number] Patient Account Number: [id number] Date of Birth/Sex: [birth year] (50 y.o. M) Treating RN: Carlene Coria Primary Care Adiana Smelcer: PATIENT, NO Other Clinician: Referring Roxanna Mcever: Barkley Boards Treating Meika Earll/Extender: Skipper Cliche in Treatment: 0 Compression Therapy Performed  for Wound Assessment: Wound #1 Right,Medial Lower Leg Performed By: Clinician Carlene Coria, RN Compression Type: Three Layer Post Procedure Diagnosis Same as Pre-procedure Electronic Signature(s) Signed: 01/15/2021 2:18:56 PM By: Carlene Coria RN Entered By: Carlene Coria on 01/12/2021 09:44:07 [name], [name] ([id number]) -------------------------------------------------------------------------------- Encounter Discharge Information Details Patient Name: Nicolas Cousin L. Date of Service: 01/12/2021 8:30 AM Medical Record Number: [id number] Patient Account Number: [id number] Date of Birth/Sex: Nov 12, 1970 (50 y.o. M) Treating RN: Donnamarie Poag Primary Care Konstance Happel: PATIENT, NO Other Clinician: Referring Josslyn Ciolek: Barkley Boards Treating Alfio Loescher/Extender: Skipper Cliche in Treatment: 0 Encounter Discharge Information Items Discharge Condition: Stable Ambulatory Status: Ambulatory Discharge Destination: Home Transportation: Other Accompanied By: self Schedule Follow-up Appointment: Yes Clinical Summary of Care: Electronic Signature(s) Signed: 01/12/2021 3:58:37 PM By: Donnamarie Poag Entered By: Donnamarie Poag on 01/12/2021 10:03:53 [name], [name] ([id number]) -------------------------------------------------------------------------------- Lower Extremity Assessment Details Patient Name: Nicolas Cousin L. Date of Service: 01/12/2021 8:30 AM Medical Record Number: [id number] Patient Account Number: [id number] Date of Birth/Sex: Jun 07, 1971 (50 y.o. M) Treating RN: Dolan Amen Primary Care Dianely Krehbiel: PATIENT, NO Other Clinician: Referring Ronnell Makarewicz: Barkley Boards Treating Zyion Leidner/Extender: Skipper Cliche in Treatment: 0 Edema Assessment Assessed: [Left: Yes] [Right: Yes] Edema: [Left: No] [Right: Yes] Calf Left: Right: Point of Measurement: 41 cm From Medial Instep 39 cm 37 cm Ankle Left: Right: Point of Measurement: 10 cm From Medial Instep 25 cm 27 cm Knee To Floor Left:  Right: From Medial Instep 57 cm 57 cm Vascular Assessment Pulses: Dorsalis Pedis Palpable: [Left:Yes] [Right:Yes] Doppler Audible: [Left:Yes] [Right:Yes] Posterior Tibial Palpable: [Left:Yes] [Right:Yes] Doppler Audible: [Left:Yes] [Right:Yes] Blood Pressure: Brachial: [Right:102] Dorsalis Pedis: 140 Ankle: Posterior Tibial: 162 Ankle Brachial Index: [Right:1.59] Electronic Signature(s) Signed: 01/12/2021 5:00:05 PM By: Georges Mouse, Minus Breeding RN Entered By: Georges Mouse, Minus Breeding on 01/12/2021 09:19:34 [name], [name] ([id number]) -------------------------------------------------------------------------------- Multi Wound Chart Details Patient Name: Nicolas Cousin L. Date of Service: 01/12/2021 8:30 AM Medical Record Number: [id number] Patient Account Number: [id number] Date of Birth/Sex: [birth year] (49 y.o.  M) Treating RN: Carlene Coria Primary Care Shaleta Ruacho: PATIENT, NO Other Clinician: Referring Katena Petitjean: Barkley Boards Treating Saide Lanuza/Extender: Skipper Cliche in Treatment: 0 Vital Signs Height(in): 78 Pulse(bpm): 66 Weight(lbs): 249 Blood Pressure(mmHg): 130/77 Body Mass Index(BMI): 29 Temperature(F): 98.3 Respiratory Rate(breaths/min): 18 Photos: [N/A:N/A] Wound Location: Right, Medial Lower Leg N/A N/A Wounding Event: Gradually Appeared N/A N/A Primary Etiology: Venous Leg Ulcer N/A N/A Date Acquired: 11/24/2020 N/A N/A Weeks of Treatment: 0 N/A N/A Wound Status: Open N/A N/A Measurements L x W x D (cm) 0.9x1.2x0.2 N/A N/A Area (cm) : 0.848 N/A N/A Volume (cm) : 0.17 N/A N/A Classification: Full Thickness Without Exposed N/A N/A Support Structures Exudate Amount: Medium N/A N/A Exudate Type: Serosanguineous N/A N/A Exudate Color: red, brown N/A N/A Wound Margin: Flat and Intact N/A N/A Granulation Amount: None Present (0%) N/A N/A Necrotic Amount: Large (67-100%) N/A N/A Exposed Structures: Fat Layer (Subcutaneous Tissue): N/A N/A Yes Fascia:  No Tendon: No Muscle: No Joint: No Bone: No Epithelialization: None N/A N/A Treatment Notes Electronic Signature(s) Signed: 01/15/2021 2:18:56 PM By: Carlene Coria RN Entered By: Carlene Coria on 01/12/2021 09:39:50 [name], [name] ([id number]) -------------------------------------------------------------------------------- [place] Details Patient Name: Nicolas Cousin L. Date of Service: 01/12/2021 8:30 AM Medical Record Number: [id number] Patient Account Number: [id number] Date of Birth/Sex: Feb 24, 1971 (50 y.o. M) Treating RN: Carlene Coria Primary Care Brit Wernette: PATIENT, NO Other Clinician: Referring Drea Jurewicz: Barkley Boards Treating Berlie Hatchel/Extender: Skipper Cliche in Treatment: 0 Active Inactive Wound/Skin Impairment Nursing Diagnoses: Knowledge deficit related to ulceration/compromised skin integrity Goals: Patient/caregiver will verbalize understanding of skin care regimen Date Initiated: 01/12/2021 Target Resolution Date: 02/12/2021 Goal Status: Active Ulcer/skin breakdown will have a volume reduction of 30% by week 4 Date Initiated: 01/12/2021 Target Resolution Date: 02/12/2021 Goal Status: Active Ulcer/skin breakdown will have a volume reduction of 50% by week 8 Date Initiated: 01/12/2021 Target Resolution Date: 03/14/2021 Goal Status: Active Ulcer/skin breakdown will have a volume reduction of 80% by week 12 Date Initiated: 01/12/2021 Target Resolution Date: 04/14/2021 Goal Status: Active Ulcer/skin breakdown will heal within 14 weeks Date Initiated: 01/12/2021 Target Resolution Date: 05/15/2021 Goal Status: Active Interventions: Assess patient/caregiver ability to obtain necessary supplies Assess patient/caregiver ability to perform ulcer/skin care regimen upon admission and as needed Assess ulceration(s) every visit Notes: Electronic Signature(s) Signed: 01/15/2021 2:18:56 PM By: Carlene Coria RN Entered By: Carlene Coria on 01/12/2021  09:39:35 [name], [name] ([id number]) -------------------------------------------------------------------------------- Pain Assessment Details Patient Name: Nicolas Cousin L. Date of Service: 01/12/2021 8:30 AM Medical Record Number: [id number] Patient Account Number: [id number] Date of Birth/Sex: 1971-07-23 (50 y.o. M) Treating RN: Dolan Amen Primary Care Elvy Mclarty: PATIENT, NO Other Clinician: Referring Brion Sossamon: Barkley Boards Treating Venson Ferencz/Extender: Skipper Cliche in Treatment: 0 Active Problems Location of Pain Severity and Description of Pain Patient Has Paino No Site Locations Rate the pain. Current Pain Level: 0 Pain Management and Medication Current Pain Management: Electronic Signature(s) Signed: 01/12/2021 5:00:05 PM By: Georges Mouse, Minus Breeding RN Entered By: Georges Mouse, Kenia on 01/12/2021 08:48:17 [name] ([id number]) -------------------------------------------------------------------------------- Patient/Caregiver Education Details Patient Name: Nicolas Cousin L. Date of Service: 01/12/2021 8:30 AM Medical Record Number: [id number] Patient Account Number: [id number] Date of Birth/Gender: Sep 29, 1970 (50 y.o. M) Treating RN: Carlene Coria Primary Care Physician: PATIENT, NO Other Clinician: Referring Physician: Barkley Boards Treating Physician/Extender: Skipper Cliche in Treatment: 0 Education Assessment Education Provided To: Patient Education Topics Provided Wound/Skin Impairment: Methods: Explain/Verbal Responses: State content correctly Electronic Signature(s) Signed: 01/15/2021 2:18:56 PM By: Carlene Coria RN  Entered By: Carlene Coria on 01/12/2021 09:45:35 [name], [name] Kitchen ([id number]) -------------------------------------------------------------------------------- Wound Assessment Details Patient Name: [name], [name] Date of Service: 01/12/2021 8:30 AM Medical Record Number: [id number] Patient Account Number: [id number] Date of Birth/Sex:  09-21-1970 (50 y.o. M) Treating RN: Dolan Amen Primary Care Nasario Czerniak: PATIENT, NO Other Clinician: Referring Jahnya Trindade: Barkley Boards Treating Hridaan Bouse/Extender: Skipper Cliche in Treatment: 0 Wound Status Wound Number: 1 Primary Etiology: Venous Leg Ulcer Wound Location: Right, Medial Lower Leg Wound Status: Open Wounding Event: Gradually Appeared Date Acquired: 11/24/2020 Weeks Of Treatment: 0 Clustered Wound: No Photos Wound Measurements Length: (cm) 0.9 Width: (cm) 1.2 Depth: (cm) 0.2 Area: (cm) 0.848 Volume: (cm) 0.17 % Reduction in Area: % Reduction in Volume: Epithelialization: None Tunneling: No Undermining: No Wound Description Classification: Full Thickness Without Exposed Support Structu Wound Margin: Flat and Intact Exudate Amount: Medium Exudate Type: Serosanguineous Exudate Color: red, brown res Foul Odor After Cleansing: No Slough/Fibrino Yes Wound Bed Granulation Amount: None Present (0%) Exposed Structure Necrotic Amount: Large (67-100%) Fascia Exposed: No Necrotic Quality: Adherent Slough Fat Layer (Subcutaneous Tissue) Exposed: Yes Tendon Exposed: No Muscle Exposed: No Joint Exposed: No Bone Exposed: No Treatment Notes Wound #1 (Lower Leg) Wound Laterality: Right, Medial Cleanser Soap and Water Discharge Instruction: Gently cleanse wound with antibacterial soap, rinse and pat dry prior to dressing wounds Peri-Wound Care [place], Duffy L. ([id number]) Topical Triamcinolone Acetonide Cream, 0.1%, 15 (g) tube Discharge Instruction: Apply as directed by Skylie Hiott. apply to wound bed Primary Dressing Hydrofera Blue Ready Transfer Foam, 2.5x2.5 (in/in) Discharge Instruction: Apply Hydrofera Blue Ready to wound bed as directed Secondary Dressing ABD Pad 5x9 (in/in) Discharge Instruction: Cover with ABD pad Secured With Compression Wrap Profore Lite LF 3 Multilayer Compression [place] Discharge Instruction: Apply 3 multi-layer wrap  as prescribed. Compression Stockings Add-Ons Electronic Signature(s) Signed: 01/12/2021 5:00:05 PM By: Georges Mouse, Minus Breeding RN Entered By: Georges Mouse, Minus Breeding on 01/12/2021 09:00:21 [name] ([id number]) -------------------------------------------------------------------------------- Vitals Details Patient Name: Nicolas Cousin L. Date of Service: 01/12/2021 8:30 AM Medical Record Number: [id number] Patient Account Number: [id number] Date of Birth/Sex: 02/24/1971 (50 y.o. M) Treating RN: Dolan Amen Primary Care Tabatha Razzano: PATIENT, NO Other Clinician: Referring Sutton Plake: Barkley Boards Treating Yunique Dearcos/Extender: Skipper Cliche in Treatment: 0 Vital Signs Time Taken: 08:48 Temperature (F): 98.3 Height (in): 78 Pulse (bpm): 66 Source: Stated Respiratory Rate (breaths/min): 18 Weight (lbs): 249 Blood Pressure (mmHg): 130/77 Source: Measured Reference Range: 80 - 120 mg / dl Body Mass Index (BMI): 28.8 Electronic Signature(s) Signed: 01/12/2021 5:00:05 PM By: Georges Mouse, Minus Breeding RN Entered By: Georges Mouse, Minus Breeding on 01/12/2021 08:49:27

## 2021-01-19 ENCOUNTER — Encounter: Payer: Self-pay | Admitting: Internal Medicine

## 2021-01-19 ENCOUNTER — Other Ambulatory Visit: Payer: Self-pay

## 2021-01-23 NOTE — Progress Notes (Signed)
NOLLIE, TERLIZZI ([id number]) Visit Report for 01/19/2021 Debridement Details Patient Name: [name], [name]. Date of Service: 01/19/2021 8:30 AM Medical Record Number: [id number] Patient Account Number: [id number] Date of Birth/Sex: 11-Nov-1970 (50 y.o. M) Treating RN: Carlene Coria Primary Care Provider: PATIENT, NO Other Clinician: Referring Provider: Barkley Boards Treating Provider/Extender: Tito Dine in Treatment: 1 Debridement Performed for Wound #1 Right,Medial Lower Leg Assessment: Performed By: Physician Ricard [name], MD Debridement Type: Debridement Severity of Tissue Pre Debridement: Fat layer exposed Level of Consciousness (Pre- Awake and Alert procedure): Pre-procedure Verification/Time Out Yes - 09:06 Taken: Start Time: 09:06 Pain Control: Lidocaine 4% Topical Solution Total Area Debrided (L x W): 0.8 (cm) x 1 (cm) = 0.8 (cm) Tissue and other material Viable, Non-Viable, Slough, Subcutaneous, Skin: Dermis , Skin: Epidermis, Slough debrided: Level: Skin/Subcutaneous Tissue Debridement Description: Excisional Instrument: Curette Bleeding: Minimum Hemostasis Achieved: Pressure End Time: 09:12 Procedural Pain: 0 Post Procedural Pain: 0 Response to Treatment: Procedure was tolerated well Level of Consciousness (Post- Awake and Alert procedure): Post Debridement Measurements of Total Wound Length: (cm) 0.8 Width: (cm) 1 Depth: (cm) 0.2 Volume: (cm) 0.126 Character of Wound/Ulcer Post Debridement: Improved Severity of Tissue Post Debridement: Fat layer exposed Post Procedure Diagnosis Same as Pre-procedure Electronic Signature(s) Signed: 01/19/2021 4:35:25 PM By: Linton Ham MD Signed: 01/23/2021 3:50:09 PM By: Carlene Coria RN Entered By: Linton Ham on 01/19/2021 09:14:26 P[place]leau, [name] ([id number]) -------------------------------------------------------------------------------- HPI Details Patient Name: Nicolas Cousin L. Date of  Service: 01/19/2021 8:30 AM Medical Record Number: [id number] Patient Account Number: [id number] Date of Birth/Sex: Jun 15, 1971 (50 y.o. M) Treating RN: Carlene Coria Primary Care Provider: PATIENT, NO Other Clinician: Referring Provider: Barkley Boards Treating Provider/Extender: Tito Dine in Treatment: 1 History of Present Illness HPI Description: 01/12/2021 upon evaluation today patient appears to be doing somewhat poorly in regard to a wound on his right medial lower leg. Currently he tells me that this has been going on since around the beginning of April. He has done a telehealth visit with a physician who initially gave him a triamcinolone cream along with Keflex for 5 days. Subsequently he ended up being seen at walk-in urgent care where they also given Keflex for 7 days that seem to be doing better for him. Overall he tells me that things have improved from the standpoint of redness. Fortunately there does not appear to be any signs of systemic infection to be honest right now even locally I do not see any evidence of infection right now. He does have 1 main wound with some scattering areas that look like they may have been small ulcerations but again for the most part these have filled in. This does look almost to be more of a vasculitis type scenario based on what I am seeing. Patient does have chronic venous insufficiency but is not wearing any compression even though it sounds like he has been told before he should. 5/27; patient with small wounds on his right medial ankle. He has chronic venous insufficiency and stasis dermatitis a large open wound and several smaller areas all of which looks better than on presentation last week according to her nursing staff Electronic Signature(s) Signed: 01/19/2021 4:35:25 PM By: Linton Ham MD Entered By: Linton Ham on 01/19/2021 09:15:58 [name]  ([id number]) -------------------------------------------------------------------------------- Physical Exam Details Patient Name: [name], [name] Date of Service: 01/19/2021 8:30 AM Medical Record Number: [id number] Patient Account Number: [id number] Date of Birth/Sex: 31-Aug-1970 (50 y.o. M) Treating RN: Epps,  [facility] Primary Care Provider: PATIENT, NO Other Clinician: Referring Provider: Barkley Boards Treating Provider/Extender: Tito Dine in Treatment: 1 Constitutional Patient is hypertensive.. Pulse regular and within target range for patient.Marland Kitchen Respirations regular, non-labored and within target range.. Temperature is normal and within the target range for the patient.Marland Kitchen appears in no distress. Cardiovascular Pedal pulses are palpable. Notes Wound exam; the patient's wounds are on the right medial lower leg. There is one that is still subcentimeter but has some depth and some size. Completely nonviable surface. There were several small scattered areas that I think are on their way to closing. Surrounding these the skin is darker than normal I suspect this is stasis dermatitis. Electronic Signature(s) Signed: 01/19/2021 4:35:25 PM By: Linton Ham MD Entered By: Linton Ham on 01/19/2021 09:35:00 [name] ([id number]) -------------------------------------------------------------------------------- Physician Orders Details Patient Name: [name], [name] Date of Service: 01/19/2021 8:30 AM Medical Record Number: [id number] Patient Account Number: [id number] Date of Birth/Sex: 10/15/1970 (50 y.o. M) Treating RN: Carlene Coria Primary Care Provider: PATIENT, NO Other Clinician: Referring Provider: Barkley Boards Treating Provider/Extender: Tito Dine in Treatment: 1 Verbal / Phone Orders: No Diagnosis Coding Follow-up Appointments o Return Appointment in 1 week. Bathing/ Shower/ Hygiene o May shower with wound dressing protected with water repellent  cover or cast protector. Edema Control - Lymphedema / Segmental Compressive Device / Other o Optional: One layer of unna paste to top of compression wrap (to act as an anchor). o Elevate, Exercise Daily and Avoid Standing for Long Periods of Time. o Elevate legs to the level of the heart and pump ankles as often as possible o Elevate leg(s) parallel to the floor when sitting. Wound Treatment Wound #1 - Lower Leg Wound Laterality: Right, Medial Cleanser: Soap and Water 1 x Per Week/30 Days Discharge Instructions: Gently cleanse wound with antibacterial soap, rinse and pat dry prior to dressing wounds Topical: Triamcinolone Acetonide Cream, 0.1%, 15 (g) tube 1 x Per Week/30 Days Discharge Instructions: Apply as directed by provider. apply to wound bed Primary Dressing: Hydrofera Blue Ready Transfer Foam, 2.5x2.5 (in/in) 1 x Per Week/30 Days Discharge Instructions: Apply Hydrofera Blue Ready to wound bed as directed Secondary Dressing: ABD Pad 5x9 (in/in) 1 x Per Week/30 Days Discharge Instructions: Cover with ABD pad Compression Wrap: Profore Lite LF 3 Multilayer Compression Bandaging System 1 x Per Week/30 Days Discharge Instructions: Apply 3 multi-layer wrap as prescribed. Electronic Signature(s) Signed: 01/19/2021 4:35:25 PM By: Linton Ham MD Signed: 01/23/2021 3:50:09 PM By: Carlene Coria RN Entered By: Carlene Coria on 01/19/2021 09:13:45 [name], [name] ([id number]) -------------------------------------------------------------------------------- Problem List Details Patient Name: [name], [name] Date of Service: 01/19/2021 8:30 AM Medical Record Number: [id number] Patient Account Number: [id number] Date of Birth/Sex: [birth year] (50 y.o. M) Treating RN: Carlene Coria Primary Care Provider: PATIENT, NO Other Clinician: Referring Provider: Barkley Boards Treating Provider/Extender: Tito Dine in Treatment: 1 Active Problems ICD-10 Encounter Code Description  Active Date MDM Diagnosis I87.2 Venous insufficiency (chronic) (peripheral) 01/12/2021 No Yes L97.812 Non-pressure chronic ulcer of other part of right lower leg with fat layer 01/12/2021 No Yes exposed Inactive Problems Resolved Problems Electronic Signature(s) Signed: 01/19/2021 4:35:25 PM By: Linton Ham MD Entered By: Linton Ham on 01/19/2021 09:14:05 [name], [name] ([id number]) -------------------------------------------------------------------------------- Progress Note Details Patient Name: Nicolas Cousin L. Date of Service: 01/19/2021 8:30 AM Medical Record Number: [id number] Patient Account Number: [id number] Date of Birth/Sex: 1971/06/15 (50 y.o. M) Treating RN: Carlene Coria Primary Care Provider: PATIENT, NO  Other Clinician: Referring Provider: Barkley Boards Treating Provider/Extender: Tito Dine in Treatment: 1 Subjective History of Present Illness (HPI) 01/12/2021 upon evaluation today patient appears to be doing somewhat poorly in regard to a wound on his right medial lower leg. Currently he tells me that this has been going on since around the beginning of April. He has done a telehealth visit with a physician who initially gave him a triamcinolone cream along with Keflex for 5 days. Subsequently he ended up being seen at walk-in urgent care where they also given Keflex for 7 days that seem to be doing better for him. Overall he tells me that things have improved from the standpoint of redness. Fortunately there does not appear to be any signs of systemic infection to be honest right now even locally I do not see any evidence of infection right now. He does have 1 main wound with some scattering areas that look like they may have been small ulcerations but again for the most part these have filled in. This does look almost to be more of a vasculitis type scenario based on what I am seeing. Patient does have chronic venous insufficiency but is not wearing any  compression even though it sounds like he has been told before he should. 5/27; patient with small wounds on his right medial ankle. He has chronic venous insufficiency and stasis dermatitis a large open wound and several smaller areas all of which looks better than on presentation last week according to her nursing staff Objective Constitutional Patient is hypertensive.. Pulse regular and within target range for patient.Marland Kitchen Respirations regular, non-labored and within target range.. Temperature is normal and within the target range for the patient.Marland Kitchen appears in no distress. Vitals Time Taken: 8:46 AM, Height: 78 in, Weight: 249 lbs, BMI: 28.8, Temperature: 98.3 F, Pulse: 64 bpm, Respiratory Rate: 18 breaths/min, Blood Pressure: 154/75 mmHg. Cardiovascular Pedal pulses are palpable. General Notes: Wound exam; the patient's wounds are on the right medial lower leg. There is one that is still subcentimeter but has some depth and some size. Completely nonviable surface. There were several small scattered areas that I think are on their way to closing. Surrounding these the skin is darker than normal I suspect this is stasis dermatitis. Integumentary (Hair, Skin) Wound #1 status is Open. Original cause of wound was Gradually Appeared. The date acquired was: 11/24/2020. The wound has been in treatment 1 weeks. The wound is located on the Right,Medial Lower Leg. The wound measures 0.8cm length x 1cm width x 0.2cm depth; 0.628cm^2 area and 0.126cm^3 volume. There is Fat Layer (Subcutaneous Tissue) exposed. There is no tunneling or undermining noted. There is a medium amount of serosanguineous drainage noted. The wound margin is flat and intact. There is no granulation within the wound bed. There is a large (67-100%) amount of necrotic tissue within the wound bed including Adherent Slough. Assessment Active Problems ICD-10 Venous insufficiency (chronic) (peripheral) Non-pressure chronic ulcer of other  part of right lower leg with fat layer exposed [name], Alexander. ([id number]) Procedures Wound #1 Pre-procedure diagnosis of Wound #1 is a Venous Leg Ulcer located on the Right,Medial Lower Leg .Severity of Tissue Pre Debridement is: Fat layer exposed. There was a Excisional Skin/Subcutaneous Tissue Debridement with a total area of 0.8 sq cm performed by Ricard [name], MD. With the following instrument(s): Curette to remove Viable and Non-Viable tissue/material. Material removed includes Subcutaneous Tissue, Slough, Skin: Dermis, and Skin: Epidermis after achieving pain control using Lidocaine  4% Topical Solution. No specimens were taken. A time out was conducted at 09:06, prior to the start of the procedure. A Minimum amount of bleeding was controlled with Pressure. The procedure was tolerated well with a pain level of 0 throughout and a pain level of 0 following the procedure. Post Debridement Measurements: 0.8cm length x 1cm width x 0.2cm depth; 0.126cm^3 volume. Character of Wound/Ulcer Post Debridement is improved. Severity of Tissue Post Debridement is: Fat layer exposed. Post procedure Diagnosis Wound #1: Same as Pre-Procedure Plan Follow-up Appointments: Return Appointment in 1 week. Bathing/ Shower/ Hygiene: May shower with wound dressing protected with water repellent cover or cast protector. Edema Control - Lymphedema / Segmental Compressive Device / Other: Optional: One layer of unna paste to top of compression wrap (to act as an anchor). Elevate, Exercise Daily and Avoid Standing for Long Periods of Time. Elevate legs to the level of the heart and pump ankles as often as possible Elevate leg(s) parallel to the floor when sitting. WOUND #1: - Lower Leg Wound Laterality: Right, Medial Cleanser: Soap and Water 1 x Per Week/30 Days Discharge Instructions: Gently cleanse wound with antibacterial soap, rinse and pat dry prior to dressing wounds Topical: Triamcinolone Acetonide  Cream, 0.1%, 15 (g) tube 1 x Per Week/30 Days Discharge Instructions: Apply as directed by provider. apply to wound bed Primary Dressing: Hydrofera Blue Ready Transfer Foam, 2.5x2.5 (in/in) 1 x Per Week/30 Days Discharge Instructions: Apply Hydrofera Blue Ready to wound bed as directed Secondary Dressing: ABD Pad 5x9 (in/in) 1 x Per Week/30 Days Discharge Instructions: Cover with ABD pad Compression Wrap: Profore Lite LF 3 Multilayer Compression Bandaging System 1 x Per Week/30 Days Discharge Instructions: Apply 3 multi-layer wrap as prescribed. 1. We did not change the primary dressing which is Hydrofera Blue ABD under 3 layer compression 2. Liberal TCA to the surrounding skin/stasis dermatitis 3. The patient does not have a history of wounds in his legs. Therefore I am thinking that we will graduate him into stockings that we have already talked about [elastic therapy]. If he has recurrent wounds in this area and then venous reflux studies might be in order although the patient is uninsured. Electronic Signature(s) Signed: 01/19/2021 4:35:25 PM By: Linton Ham MD Entered By: Linton Ham on 01/19/2021 [name], [name] ([id number]) -------------------------------------------------------------------------------- [place] Details Patient Name: [name], [name] Date of Service: 01/19/2021 Medical Record Number: [id number] Patient Account Number: [id number] Date of Birth/Sex: Nov 12, 1970 (50 y.o. M) Treating RN: Carlene Coria Primary Care Provider: PATIENT, NO Other Clinician: Referring Provider: Barkley Boards Treating Provider/Extender: Tito Dine in Treatment: 1 Diagnosis Coding ICD-10 Codes Code Description I87.2 Venous insufficiency (chronic) (peripheral) L97.812 Non-pressure chronic ulcer of other part of right lower leg with fat layer exposed Facility Procedures CPT4 Code: [id number] Description: [place] TISSUE 20 SQ CM/< Modifier: Quantity: 1 CPT4  Code: Description: ICD-10 Diagnosis Description H08.657 Non-pressure chronic ulcer of other part of right lower leg with fat lay I87.2 Venous insufficiency (chronic) (peripheral) Modifier: er exposed Quantity: Physician Procedures CPT4 Code: [id number] Description: 11042 - WC PHYS SUBQ TISS 20 SQ CM Modifier: Quantity: 1 CPT4 Code: Description: ICD-10 Diagnosis Description B28.413 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

## 2021-01-23 NOTE — Progress Notes (Signed)
LADON, VANDENBERGHE ([id number]) Visit Report for 01/19/2021 Arrival Information Details Patient Name: [name], [name]. Date of Service: 01/19/2021 8:30 AM Medical Record Number: [id number] Patient Account Number: [id number] Date of Birth/Sex: April 29, 1971 (50 y.o. M) Treating RN: Carlene Coria Primary Care Kiah Keay: PATIENT, NO Other Clinician: Referring Anastazja Isaac: Barkley Boards Treating Gracynn Rajewski/Extender: Tito Dine in Treatment: 1 Visit Information History Since Last Visit Added or deleted any medications: No Patient Arrived: Ambulatory Had a fall or experienced change in No Arrival Time: 08:46 activities of daily living that may affect Accompanied By: self risk of falls: Transfer Assistance: None Hospitalized since last visit: No Patient Identification Verified: Yes Pain Present Now: No Secondary Verification Process Completed: Yes Patient Has Alerts: Yes Patient Alerts: NOT DIABETIC Electronic Signature(s) Signed: 01/19/2021 4:08:41 PM By: Jeanine Luz Entered By: Jeanine Luz on 01/19/2021 08:47:10 Califano, Abdalrahman Carlean Jews ([id number]) -------------------------------------------------------------------------------- Clinic Level of Care Assessment Details Patient Name: Nicolas Cousin L. Date of Service: 01/19/2021 8:30 AM Medical Record Number: [id number] Patient Account Number: [id number] Date of Birth/Sex: 1971/03/03 (50 y.o. M) Treating RN: Carlene Coria Primary Care Marilyn Nihiser: PATIENT, NO Other Clinician: Referring Issac Moure: Barkley Boards Treating Maren Wiesen/Extender: Tito Dine in Treatment: 1 Clinic Level of Care Assessment Items TOOL 1 Quantity Score []  - Use when EandM and Procedure is performed on INITIAL visit 0 ASSESSMENTS - Nursing Assessment / Reassessment []  - General Physical Exam (combine w/ comprehensive assessment (listed just below) when performed on new 0 pt. evals) []  - 0 Comprehensive Assessment (HX, ROS, Risk Assessments, Wounds Hx,  etc.) ASSESSMENTS - Wound and Skin Assessment / Reassessment []  - Dermatologic / Skin Assessment (not related to wound area) 0 ASSESSMENTS - Ostomy and/or Continence Assessment and Care []  - Incontinence Assessment and Management 0 []  - 0 Ostomy Care Assessment and Management (repouching, etc.) PROCESS - Coordination of Care []  - Simple Patient / Family Education for ongoing care 0 []  - 0 Complex (extensive) Patient / Family Education for ongoing care []  - 0 Staff obtains Programmer, systems, Records, Test Results / Process Orders []  - 0 Staff telephones HHA, Nursing Homes / Clarify orders / etc []  - 0 Routine Transfer to another Facility (non-emergent condition) []  - 0 Routine Hospital Admission (non-emergent condition) []  - 0 New Admissions / Biomedical engineer / Ordering NPWT, Apligraf, etc. []  - 0 Emergency Hospital Admission (emergent condition) PROCESS - Special Needs []  - Pediatric / Minor Patient Management 0 []  - 0 Isolation Patient Management []  - 0 Hearing / Language / Visual special needs []  - 0 Assessment of Community assistance (transportation, D/C planning, etc.) []  - 0 Additional assistance / Altered mentation []  - 0 Support Surface(s) Assessment (bed, cushion, seat, etc.) INTERVENTIONS - Miscellaneous []  - External ear exam 0 []  - 0 Patient Transfer (multiple staff / Civil Service fast streamer / Similar devices) []  - 0 Simple Staple / Suture removal (25 or less) []  - 0 Complex Staple / Suture removal (26 or more) []  - 0 Hypo/Hyperglycemic Management (do not check if billed separately) []  - 0 Ankle / Brachial Index (ABI) - do not check if billed separately Has the patient been seen at the hospital within the last three years: Yes Total Score: 0 Level Of Care: ____ [name] ([id number]) Electronic Signature(s) Signed: 01/23/2021 3:50:09 PM By: Carlene Coria RN Entered By: Carlene Coria on 01/19/2021 09:11:53 Fatica, Daelon L.  ([id number]) -------------------------------------------------------------------------------- Encounter Discharge Information Details Patient Name: Nicolas Cousin L. Date of Service: 01/19/2021 8:30 AM Medical Record Number: [id number] Patient Account  Number: [id number] Date of Birth/Sex: 07/13/1971 (49 y.o. M) Treating RN: Dolan Amen Primary Care Revis Whalin: PATIENT, NO Other Clinician: Referring Emiliya Chretien: Barkley Boards Treating Mallery Harshman/Extender: Tito Dine in Treatment: 1 Encounter Discharge Information Items Post Procedure Vitals Discharge Condition: Stable Temperature (F): 98.3 Ambulatory Status: Ambulatory Pulse (bpm): 64 Discharge Destination: Home Respiratory Rate (breaths/min): 18 Transportation: Private Auto Blood Pressure (mmHg): 154/75 Accompanied By: self Schedule Follow-up Appointment: Yes Clinical Summary of Care: Electronic Signature(s) Signed: 01/19/2021 4:05:26 PM By: Georges Mouse, Minus Breeding RN Entered By: Georges Mouse, Minus Breeding on 01/19/2021 09:31:53 Waldren, Decarlos L. ([id number]) -------------------------------------------------------------------------------- Lower Extremity Assessment Details Patient Name: Nicolas Cousin L. Date of Service: 01/19/2021 8:30 AM Medical Record Number: [id number] Patient Account Number: [id number] Date of Birth/Sex: 11/18/1970 (50 y.o. M) Treating RN: Carlene Coria Primary Care Sarah-Jane Nazario: PATIENT, NO Other Clinician: Referring Mike Berntsen: Barkley Boards Treating Makynzi Eastland/Extender: Tito Dine in Treatment: 1 Edema Assessment Assessed: [Left: No] [Right: Yes] Edema: [Left: Ye] [Right: s] Calf Left: Right: Point of Measurement: 41 cm From Medial Instep 35.7 cm Ankle Left: Right: Point of Measurement: 10 cm From Medial Instep 24.5 cm Vascular Assessment Pulses: Dorsalis Pedis Palpable: [Right:Yes] Electronic Signature(s) Signed: 01/19/2021 4:08:41 PM By: Jeanine Luz Signed: 01/23/2021 3:50:09 PM By: Carlene Coria RN Entered By: Jeanine Luz on 01/19/2021 09:00:15 Calvin, [place] ([id number]) -------------------------------------------------------------------------------- Multi Wound Chart Details Patient Name: Nicolas Cousin L. Date of Service: 01/19/2021 8:30 AM Medical Record Number: [id number] Patient Account Number: [id number] Date of Birth/Sex: Feb 07, 1971 (50 y.o. M) Treating RN: Carlene Coria Primary Care Marcea Rojek: PATIENT, NO Other Clinician: Referring Synda Bagent: Barkley Boards Treating Toree Edling/Extender: Tito Dine in Treatment: 1 Vital Signs Height(in): 78 Pulse(bpm): 64 Weight(lbs): 249 Blood Pressure(mmHg): 154/75 Body Mass Index(BMI): 29 Temperature(F): 98.3 Respiratory Rate(breaths/min): 18 Photos: [N/A:N/A] Wound Location: Right, Medial Lower Leg N/A N/A Wounding Event: Gradually Appeared N/A N/A Primary Etiology: Venous Leg Ulcer N/A N/A Date Acquired: 11/24/2020 N/A N/A Weeks of Treatment: 1 N/A N/A Wound Status: Open N/A N/A Measurements L x W x D (cm) 0.8x1x0.2 N/A N/A Area (cm) : 0.628 N/A N/A Volume (cm) : 0.126 N/A N/A % Reduction in Area: 25.90% N/A N/A % Reduction in Volume: 25.90% N/A N/A Classification: Full Thickness Without Exposed N/A N/A Support Structures Exudate Amount: Medium N/A N/A Exudate Type: Serosanguineous N/A N/A Exudate Color: red, brown N/A N/A Wound Margin: Flat and Intact N/A N/A Granulation Amount: None Present (0%) N/A N/A Necrotic Amount: Large (67-100%) N/A N/A Exposed Structures: Fat Layer (Subcutaneous Tissue): N/A N/A Yes Fascia: No Tendon: No Muscle: No Joint: No Bone: No Epithelialization: None N/A N/A Debridement: Debridement - Excisional N/A N/A Pre-procedure Verification/Time 09:06 N/A N/A Out Taken: Pain Control: Lidocaine 4% Topical Solution N/A N/A Tissue Debrided: Subcutaneous, Slough N/A N/A Level: Skin/Subcutaneous Tissue N/A N/A Debridement Area (sq cm): 0.8 N/A N/A Instrument: Curette N/A  N/A Bleeding: Minimum N/A N/A Hemostasis Achieved: Pressure N/A N/A Procedural Pain: 0 N/A N/A Post Procedural Pain: 0 N/A N/A Debridement Treatment Procedure was tolerated well N/A N/A Response: 0.8x1x0.2 N/A N/A Swenson, Drexel L. ([id number]) Post Debridement Measurements L x W x D (cm) Post Debridement Volume: 0.126 N/A N/A (cm) Procedures Performed: Debridement N/A N/A Treatment Notes Electronic Signature(s) Signed: 01/19/2021 4:35:25 PM By: Linton Ham MD Entered By: Linton Ham on 01/19/2021 09:14:15 [name] ([id number]) -------------------------------------------------------------------------------- [place] Plan Details Patient Name: KAIL, FRALEY L. Date of Service: 01/19/2021 8:30 AM Medical Record Number: [id number] Patient Account Number: [id number] Date of Birth/Sex: August 22, 1971 (49  y.o. M) Treating RN: Carlene Coria Primary Care Lida Berkery: PATIENT, NO Other Clinician: Referring Adalind Weitz: Barkley Boards Treating Derika Eckles/Extender: Tito Dine in Treatment: 1 Active Inactive Wound/Skin Impairment Nursing Diagnoses: Knowledge deficit related to ulceration/compromised skin integrity Goals: Patient/caregiver will verbalize understanding of skin care regimen Date Initiated: 01/12/2021 Target Resolution Date: 02/12/2021 Goal Status: Active Ulcer/skin breakdown will have a volume reduction of 30% by week 4 Date Initiated: 01/12/2021 Target Resolution Date: 02/12/2021 Goal Status: Active Ulcer/skin breakdown will have a volume reduction of 50% by week 8 Date Initiated: 01/12/2021 Target Resolution Date: 03/14/2021 Goal Status: Active Ulcer/skin breakdown will have a volume reduction of 80% by week 12 Date Initiated: 01/12/2021 Target Resolution Date: 04/14/2021 Goal Status: Active Ulcer/skin breakdown will heal within 14 weeks Date Initiated: 01/12/2021 Target Resolution Date: 05/15/2021 Goal Status: Active Interventions: Assess  patient/caregiver ability to obtain necessary supplies Assess patient/caregiver ability to perform ulcer/skin care regimen upon admission and as needed Assess ulceration(s) every visit Notes: Electronic Signature(s) Signed: 01/23/2021 3:50:09 PM By: Carlene Coria RN Entered By: Carlene Coria on 01/19/2021 09:06:20 Nygren, [place] ([id number]) -------------------------------------------------------------------------------- Pain Assessment Details Patient Name: Nicolas Cousin L. Date of Service: 01/19/2021 8:30 AM Medical Record Number: [id number] Patient Account Number: [id number] Date of Birth/Sex: 05-17-1971 (50 y.o. M) Treating RN: Carlene Coria Primary Care Stanisha Lorenz: PATIENT, NO Other Clinician: Referring Levent Kornegay: Barkley Boards Treating Sakari Alkhatib/Extender: Tito Dine in Treatment: 1 Active Problems Location of Pain Severity and Description of Pain Patient Has Paino No Site Locations Rate the pain. Current Pain Level: 0 Pain Management and Medication Current Pain Management: Electronic Signature(s) Signed: 01/19/2021 4:08:41 PM By: Jeanine Luz Signed: 01/23/2021 3:50:09 PM By: Carlene Coria RN Entered By: Jeanine Luz on 01/19/2021 [place], [place] ([id number]) -------------------------------------------------------------------------------- Patient/Caregiver Education Details Patient Name: Nicolas Cousin L. Date of Service: 01/19/2021 8:30 AM Medical Record Number: [id number] Patient Account Number: [id number] Date of Birth/Gender: [birth year] (50 y.o. M) Treating RN: Carlene Coria Primary Care Physician: PATIENT, NO Other Clinician: Referring Physician: Barkley Boards Treating Physician/Extender: Tito Dine in Treatment: 1 Education Assessment Education Provided To: Patient Education Topics Provided Wound/Skin Impairment: Methods: Explain/Verbal Responses: State content correctly Electronic Signature(s) Signed: 01/23/2021 3:50:09 PM By: Carlene Coria RN Entered By: Carlene Coria on 01/19/2021 09:12:20 Buehrer, Beulah ([id number]) -------------------------------------------------------------------------------- Wound Assessment Details Patient Name: Nicolas Cousin L. Date of Service: 01/19/2021 8:30 AM Medical Record Number: [id number] Patient Account Number: [id number] Date of Birth/Sex: [birth year] (50 y.o. M) Treating RN: Carlene Coria Primary Care Kesean Serviss: PATIENT, NO Other Clinician: Referring Hend Mccarrell: Barkley Boards Treating Taeshawn Helfman/Extender: Tito Dine in Treatment: 1 Wound Status Wound Number: 1 Primary Etiology: Venous Leg Ulcer Wound Location: Right, Medial Lower Leg Wound Status: Open Wounding Event: Gradually Appeared Date Acquired: 11/24/2020 Weeks Of Treatment: 1 Clustered Wound: No Photos Wound Measurements Length: (cm) 0.8 Width: (cm) 1 Depth: (cm) 0.2 Area: (cm) 0.628 Volume: (cm) 0.126 % Reduction in Area: 25.9% % Reduction in Volume: 25.9% Epithelialization: None Tunneling: No Undermining: No Wound Description Classification: Full Thickness Without Exposed Support Structures Wound Margin: Flat and Intact Exudate Amount: Medium Exudate Type: Serosanguineous Exudate Color: red, brown Foul Odor After Cleansing: No Slough/Fibrino Yes Wound Bed Granulation Amount: None Present (0%) Exposed Structure Necrotic Amount: Large (67-100%) Fascia Exposed: No Necrotic Quality: Adherent Slough Fat Layer (Subcutaneous Tissue) Exposed: Yes Tendon Exposed: No Muscle Exposed: No Joint Exposed: No Bone Exposed: No Treatment Notes Wound #1 (Lower Leg) Wound Laterality: Right, Medial Cleanser Soap and Water Discharge Instruction:  Gently cleanse wound with antibacterial soap, rinse and pat dry prior to dressing wounds Peri-Wound Care ANTHONEY, SHEPPARD L. ([id number]) Topical Triamcinolone Acetonide Cream, 0.1%, 15 (g) tube Discharge Instruction: Apply as directed by Mega Kinkade. apply to wound  bed Primary Dressing Hydrofera Blue Ready Transfer Foam, 2.5x2.5 (in/in) Discharge Instruction: Apply Hydrofera Blue Ready to wound bed as directed Secondary Dressing ABD Pad 5x9 (in/in) Discharge Instruction: Cover with ABD pad Secured With Compression Wrap Profore Lite LF 3 Multilayer Compression [place] Discharge Instruction: Apply 3 multi-layer wrap as prescribed. Compression Stockings Add-Ons Electronic Signature(s) Signed: 01/19/2021 4:08:41 PM By: Jeanine Luz Signed: 01/23/2021 3:50:09 PM By: Carlene Coria RN Entered By: Jeanine Luz on 01/19/2021 08:58:11 Brummitt, Tyray Carlean Jews ([id number]) -------------------------------------------------------------------------------- Vitals Details Patient Name: Nicolas Cousin L. Date of Service: 01/19/2021 8:30 AM Medical Record Number: [id number] Patient Account Number: [id number] Date of Birth/Sex: 07/17/1971 (50 y.o. M) Treating RN: Carlene Coria Primary Care Para Cossey: PATIENT, NO Other Clinician: Referring Trelon Plush: Barkley Boards Treating Lovette Merta/Extender: Tito Dine in Treatment: 1 Vital Signs Time Taken: 08:46 Temperature (F): 98.3 Height (in): 78 Pulse (bpm): 64 Weight (lbs): 249 Respiratory Rate (breaths/min): 18 Body Mass Index (BMI): 28.8 Blood Pressure (mmHg): 154/75 Reference Range: 80 - 120 mg / dl Electronic Signature(s) Signed: 01/19/2021 4:08:41 PM By: Jeanine Luz Entered By: Jeanine Luz on 01/19/2021 08:49:22

## 2021-01-26 ENCOUNTER — Other Ambulatory Visit: Payer: Self-pay

## 2021-01-26 ENCOUNTER — Encounter: Payer: Self-pay | Attending: Physician Assistant | Admitting: Physician Assistant

## 2021-01-26 DIAGNOSIS — I872 Venous insufficiency (chronic) (peripheral): Secondary | ICD-10-CM | POA: Insufficient documentation

## 2021-01-26 DIAGNOSIS — L97812 Non-pressure chronic ulcer of other part of right lower leg with fat layer exposed: Secondary | ICD-10-CM | POA: Insufficient documentation

## 2021-01-26 NOTE — Progress Notes (Addendum)
AIRON, SAHNI ([id number]) Visit Report for 01/26/2021 Chief Complaint Document Details Patient Name: [name], [name]. Date of Service: 01/26/2021 9:00 AM Medical Record Number: [id number] Patient Account Number: [id number] Date of Birth/Sex: 05-12-1971 (50 y.o. M) Treating RN: Carlene Coria Primary Care Provider: PATIENT, NO Other Clinician: Referring Provider: Barkley Boards Treating Provider/Extender: Skipper Cliche in Treatment: 2 Information Obtained from: Patient Chief Complaint Right LE Ulcer Electronic Signature(s) Signed: 01/26/2021 9:27:33 AM By: Worthy Keeler PA-C Entered By: Worthy Keeler on 01/26/2021 09:27:32 [place], [place] ([id number]) -------------------------------------------------------------------------------- Debridement Details Patient Name: Nicolas Cousin L. Date of Service: 01/26/2021 9:00 AM Medical Record Number: [id number] Patient Account Number: [id number] Date of Birth/Sex: 03/16/1971 (50 y.o. M) Treating RN: Carlene Coria Primary Care Provider: PATIENT, NO Other Clinician: Referring Provider: Barkley Boards Treating Provider/Extender: Skipper Cliche in Treatment: 2 Debridement Performed for Wound #1 Right,Medial Lower Leg Assessment: Performed By: Physician Tommie Sams., PA-C Debridement Type: Debridement Severity of Tissue Pre Debridement: Fat layer exposed Level of Consciousness (Pre- Awake and Alert procedure): Pre-procedure Verification/Time Out Yes - 09:28 Taken: Start Time: 09:28 Pain Control: Lidocaine 4% Topical Solution Total Area Debrided (L x W): 0.8 (cm) x 0.8 (cm) = 0.64 (cm) Tissue and other material Viable, Non-Viable, Slough, Subcutaneous, Skin: Dermis , Skin: Epidermis, Slough debrided: Level: Skin/Subcutaneous Tissue Debridement Description: Excisional Instrument: Curette Bleeding: Moderate Hemostasis Achieved: Pressure End Time: 09:31 Procedural Pain: 3 Post Procedural Pain: 1 Response to Treatment: Procedure was not  tolerated well Level of Consciousness (Post- Awake and Alert procedure): Post Debridement Measurements of Total Wound Length: (cm) 0.8 Width: (cm) 0.8 Depth: (cm) 0.1 Volume: (cm) 0.05 Character of Wound/Ulcer Post Debridement: Improved Severity of Tissue Post Debridement: Fat layer exposed Post Procedure Diagnosis Same as Pre-procedure Electronic Signature(s) Signed: 01/26/2021 5:30:36 PM By: Worthy Keeler PA-C Signed: 01/30/2021 8:01:20 AM By: Carlene Coria RN Entered By: Carlene Coria on 01/26/2021 09:31:47 [name], [name] ([id number]) -------------------------------------------------------------------------------- HPI Details Patient Name: Nicolas Cousin L. Date of Service: 01/26/2021 9:00 AM Medical Record Number: [id number] Patient Account Number: [id number] Date of Birth/Sex: 1970-10-17 (50 y.o. M) Treating RN: Carlene Coria Primary Care Provider: PATIENT, NO Other Clinician: Referring Provider: Barkley Boards Treating Provider/Extender: Skipper Cliche in Treatment: 2 History of Present Illness HPI Description: 01/12/2021 upon evaluation today patient appears to be doing somewhat poorly in regard to a wound on his right medial lower leg. Currently he tells me that this has been going on since around the beginning of April. He has done a telehealth visit with a physician who initially gave him a triamcinolone cream along with Keflex for 5 days. Subsequently he ended up being seen at walk-in urgent care where they also given Keflex for 7 days that seem to be doing better for him. Overall he tells me that things have improved from the standpoint of redness. Fortunately there does not appear to be any signs of systemic infection to be honest right now even locally I do not see any evidence of infection right now. He does have 1 main wound with some scattering areas that look like they may have been small ulcerations but again for the most part these have filled in. This does look almost  to be more of a vasculitis type scenario based on what I am seeing. Patient does have chronic venous insufficiency but is not wearing any compression even though it sounds like he has been told before he should. 5/27; patient with small wounds on his right medial  ankle. He has chronic venous insufficiency and stasis dermatitis a large open wound and several smaller areas all of which looks better than on presentation last week according to her nursing staff 01/26/2021 upon evaluation today patient appears to be doing well with regard to his wound. He is making good progress. With that being said there is some need currently for sharp debridement in regard to the wound there is some necrotic debris it is almost completely cleaned off but not completely. He is in agreement with that plan today. Fortunately there does not appear to be any evidence of active infection which is great and I am very pleased in that regard. He still wishes he did not have to wear the compression wrap. He did get his compression socks from [facility] therapy though they are in the 15 to 20 mmHg range they did not have any his length as he is a tall guy in the 20 to 30 mmHg range. Electronic Signature(s) Signed: 01/26/2021 10:18:16 AM By: Worthy Keeler PA-C Entered By: Worthy Keeler on 01/26/2021 10:18:16 [name] ([id number]) -------------------------------------------------------------------------------- Physical Exam Details Patient Name: [name], [name] Date of Service: 01/26/2021 9:00 AM Medical Record Number: [id number] Patient Account Number: [id number] Date of Birth/Sex: August 16, 1971 (50 y.o. M) Treating RN: Carlene Coria Primary Care Provider: PATIENT, NO Other Clinician: Referring Provider: Barkley Boards Treating Provider/Extender: Skipper Cliche in Treatment: 2 Constitutional Well-nourished and well-hydrated in no acute distress. Respiratory normal breathing without difficulty. Psychiatric this patient  is able to make decisions and demonstrates good insight into disease process. Alert and Oriented x 3. pleasant and cooperative. Notes Upon inspection patient's wound bed did require sharp debridement clear with some of the necrotic debris he tolerated that today without complication postdebridement wound bed does appear to be doing better which is good news. Electronic Signature(s) Signed: 01/26/2021 10:18:54 AM By: Worthy Keeler PA-C Entered By: Worthy Keeler on 01/26/2021 10:18:54 [name] ([id number]) -------------------------------------------------------------------------------- Physician Orders Details Patient Name: [name], [name] Date of Service: 01/26/2021 9:00 AM Medical Record Number: [id number] Patient Account Number: [id number] Date of Birth/Sex: 02-10-1971 (50 y.o. M) Treating RN: Carlene Coria Primary Care Provider: PATIENT, NO Other Clinician: Referring Provider: Barkley Boards Treating Provider/Extender: Skipper Cliche in Treatment: 2 Verbal / Phone Orders: No Diagnosis Coding ICD-10 Coding Code Description I87.2 Venous insufficiency (chronic) (peripheral) L97.812 Non-pressure chronic ulcer of other part of right lower leg with fat layer exposed Follow-up Appointments o Return Appointment in 1 week. Bathing/ Shower/ Hygiene o May shower with wound dressing protected with water repellent cover or cast protector. Edema Control - Lymphedema / Segmental Compressive Device / Other o Optional: One layer of unna paste to top of compression wrap (to act as an anchor). o Elevate, Exercise Daily and Avoid Standing for Long Periods of Time. o Elevate legs to the level of the heart and pump ankles as often as possible o Elevate leg(s) parallel to the floor when sitting. Wound Treatment Wound #1 - Lower Leg Wound Laterality: Right, Medial Cleanser: Soap and Water 1 x Per Week/30 Days Discharge Instructions: Gently cleanse wound with antibacterial soap, rinse  and pat dry prior to dressing wounds Topical: Triamcinolone Acetonide Cream, 0.1%, 15 (g) tube 1 x Per Week/30 Days Discharge Instructions: Apply as directed by provider. apply to wound bed Primary Dressing: Prisma 4.34 (in) 1 x Per Week/30 Days Discharge Instructions: Moisten w/normal saline or sterile water; Cover wound as directed. Do not remove from wound bed. Secondary  Dressing: ABD Pad 5x9 (in/in) 1 x Per Week/30 Days Discharge Instructions: Cover with ABD pad Compression Wrap: Profore Lite LF 3 Multilayer Compression Bandaging System 1 x Per Week/30 Days Discharge Instructions: Apply 3 multi-layer wrap as prescribed. Electronic Signature(s) Signed: 01/26/2021 5:30:36 PM By: Worthy Keeler PA-C Signed: 01/30/2021 8:01:20 AM By: Carlene Coria RN Entered By: Carlene Coria on 01/26/2021 09:33:35 [name], [name] ([id number]) -------------------------------------------------------------------------------- Problem List Details Patient Name: CHINO, SARDO L. Date of Service: 01/26/2021 9:00 AM Medical Record Number: [id number] Patient Account Number: [id number] Date of Birth/Sex: Nov 11, 1970 (49 y.o. 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 ([id number]) -------------------------------------------------------------------------------- Progress Note Details Patient Name: Nicolas Cousin L. Date of Service: 01/26/2021 9:00 AM Medical  Record Number: [id number] Patient Account Number: [id number] Date of Birth/Sex: 07-09-1971 (50 y.o. M) Treating RN: Carlene Coria Primary Care Provider: PATIENT, NO Other Clinician: Referring Provider: Barkley Boards Treating Provider/Extender: Skipper Cliche in Treatment: 2 Subjective Chief Complaint Information obtained from Patient Right LE Ulcer History of Present Illness (HPI) 01/12/2021 upon evaluation today patient appears to be doing somewhat poorly in regard to a wound on his right medial lower leg. Currently he tells me that this has been going on since around the beginning of April. He has done a telehealth visit with a physician who initially gave him a triamcinolone cream along with Keflex for 5 days. Subsequently he ended up being seen at walk-in urgent care where they also given Keflex for 7 days that seem to be doing better for him. Overall he tells me that things have improved from the standpoint of redness. Fortunately there does not appear to be any signs of systemic infection to be honest right now even locally I do not see any evidence of infection right now. He does have 1 main wound with some scattering areas that look like they may have been small ulcerations but again for the most part these have filled in. This does look almost to be more of a vasculitis type scenario based on what I am seeing. Patient does have chronic venous insufficiency but is not wearing any compression even though it sounds like he has been told before he should. 5/27; patient with small wounds on his right medial ankle. He has chronic venous insufficiency and stasis dermatitis a large open wound and several smaller areas all of which looks better than on presentation last week according to her nursing staff 01/26/2021 upon evaluation today patient appears to be doing well with regard to his wound. He is making good progress. With that being said there is some need currently for sharp debridement in  regard to the wound there is some necrotic debris it is almost completely cleaned off but not completely. He is in agreement with that plan today. Fortunately there does not appear to be any evidence of active infection which is great and I am very pleased in that regard. He still wishes he did not have to wear the compression wrap. He did get his compression socks from elastic therapy though they  are in the 15 to 20 mmHg range they did not have any his length as he is a tall guy in the 20 to 30 mmHg range. Objective Constitutional Well-nourished and well-hydrated in no acute distress. Vitals Time Taken: 9:09 AM, Height: 78 in, Weight: 249 lbs, BMI: 28.8, Temperature: 98.2 F, Pulse: 72 bpm, Respiratory Rate: 18 breaths/min, Blood Pressure: 144/76 mmHg. Respiratory normal breathing without difficulty. Psychiatric this patient is able to make decisions and demonstrates good insight into disease process. Alert and Oriented x 3. pleasant and cooperative. General Notes: Upon inspection patient's wound bed did require sharp debridement clear with some of the necrotic debris he tolerated that today without complication postdebridement wound bed does appear to be doing better which is good news. Integumentary (Hair, Skin) Wound #1 status is Open. Original cause of wound was Gradually Appeared. The date acquired was: 11/24/2020. The wound has been in treatment 2 weeks. The wound is located on the Right,Medial Lower Leg. The wound measures 0.8cm length x 0.8cm width x 0.1cm depth; 0.503cm^2 area and 0.05cm^3 volume. There is Fat Layer (Subcutaneous Tissue) exposed. There is no tunneling or undermining noted. There is a medium amount of serous drainage noted. The wound margin is flat and intact. There is small (1-33%) pink granulation within the wound bed. There is a large (67-100%) amount of necrotic tissue within the wound bed including Adherent Slough. Assessment [name], [name] ([id number]) Active  Problems ICD-10 Venous insufficiency (chronic) (peripheral) Non-pressure chronic ulcer of other part of right lower leg with fat layer exposed Procedures Wound #1 Pre-procedure diagnosis of Wound #1 is a Venous Leg Ulcer located on the Right,Medial Lower Leg .Severity of Tissue Pre Debridement is: Fat layer exposed. There was a Excisional Skin/Subcutaneous Tissue Debridement with a total area of 0.64 sq cm performed by Tommie Sams., PA-C. With the following instrument(s): Curette to remove Viable and Non-Viable tissue/material. Material removed includes Subcutaneous Tissue, Slough, Skin: Dermis, and Skin: Epidermis after achieving pain control using Lidocaine 4% Topical Solution. No specimens were taken. A time out was conducted at 09:28, prior to the start of the procedure. A Moderate amount of bleeding was controlled with Pressure. The procedure was not tolerated well with a pain level of 3 throughout and a pain level of 1 following the procedure. Post Debridement Measurements: 0.8cm length x 0.8cm width x 0.1cm depth; 0.05cm^3 volume. Character of Wound/Ulcer Post Debridement is improved. Severity of Tissue Post Debridement is: Fat layer exposed. Post procedure Diagnosis Wound #1: Same as Pre-Procedure Plan Follow-up Appointments: Return Appointment in 1 week. Bathing/ Shower/ Hygiene: May shower with wound dressing protected with water repellent cover or cast protector. Edema Control - Lymphedema / Segmental Compressive Device / Other: Optional: One layer of unna paste to top of compression wrap (to act as an anchor). Elevate, Exercise Daily and Avoid Standing for Long Periods of Time. Elevate legs to the level of the heart and pump ankles as often as possible Elevate leg(s) parallel to the floor when sitting. WOUND #1: - Lower Leg Wound Laterality: Right, Medial Cleanser: Soap and Water 1 x Per Week/30 Days Discharge Instructions: Gently cleanse wound with antibacterial soap, rinse  and pat dry prior to dressing wounds Topical: Triamcinolone Acetonide Cream, 0.1%, 15 (g) tube 1 x Per Week/30 Days Discharge Instructions: Apply as directed by provider. apply to wound bed Primary Dressing: Prisma 4.34 (in) 1 x Per Week/30 Days Discharge Instructions: Moisten w/normal saline or sterile water; Cover wound as directed. Do not remove from  wound bed. Secondary Dressing: ABD Pad 5x9 (in/in) 1 x Per Week/30 Days Discharge Instructions: Cover with ABD pad Compression Wrap: Profore Lite LF 3 Multilayer Compression Bandaging System 1 x Per Week/30 Days Discharge Instructions: Apply 3 multi-layer wrap as prescribed. 1. Would recommend currently that we continue with the current orders which include silver collagen followed by an ABD pad. I think this is still a good option for him. 2. I am also can recommend that we continue with a 3 layer compression wrap to follow. 3. I am also can recommend that we have the patient continue to elevate his leg is much as possible when he is tells me he is doing all these things and it sounds like he is doing a great job. With that being said I do believe that he obviously has to work and he works quite a bit. I do not see any evidence of damage from him working I think that is okay he should just move around often during the day. We will see patient back for reevaluation in 1 week here in the clinic. If anything worsens or changes patient will contact our office for additional recommendations. Electronic Signature(s) Signed: 01/26/2021 10:19:56 AM By: Worthy Keeler PA-C Entered By: Worthy Keeler on 01/26/2021 10:19:56 [name], [name] ([id number]) -------------------------------------------------------------------------------- SuperBill Details Patient Name: Nicolas Cousin L. Date of Service: 01/26/2021 Medical Record Number: [id number] Patient Account Number: [id number] Date of Birth/Sex: 05/17/1971 (50 y.o. M) Treating RN: Carlene Coria Primary  Care Provider: PATIENT, NO Other Clinician: Referring Provider: Barkley Boards Treating Provider/Extender: Skipper Cliche in Treatment: 2 Diagnosis Coding ICD-10 Codes Code Description I87.2 Venous insufficiency (chronic) (peripheral) L97.812 Non-pressure chronic ulcer of other part of right lower leg with fat layer exposed Facility Procedures CPT4 Code: [id number] Description: 83729 - DEB SUBQ TISSUE 20 SQ CM/< Modifier: Quantity: 1 CPT4 Code: Description: ICD-10 Diagnosis Description M21.115 Non-pressure chronic ulcer of other part of right lower leg with fat lay Modifier: er exposed Quantity: Physician Procedures CPT4 Code: [id number] Description: [place] - WC PHYS SUBQ TISS 20 SQ CM Modifier: Quantity: 1 CPT4 Code: Description: ICD-10 Diagnosis Description V36.122 Non-pressure chronic ulcer of other part of right lower leg with fat lay Modifier: er exposed Quantity: Electronic Signature(s) Signed: 01/26/2021 10:20:07 AM By: Worthy Keeler PA-C Entered By: Worthy Keeler on 01/26/2021 10:20:06

## 2021-01-30 NOTE — Progress Notes (Signed)
[name] ([id number]) Visit Report for 01/26/2021 Arrival Information Details Patient Name: [name], [name]. Date of Service: 01/26/2021 9:00 AM Medical Record Number: [id number] Patient Account Number: [id number] Date of Birth/Sex: 1970-09-16 (50 y.o. M) Treating RN: Carlene Coria Primary Care Kiara Keep: PATIENT, NO Other Clinician: Referring Boe Deans: Barkley Boards Treating Dinesha Twiggs/Extender: Skipper Cliche in Treatment: 2 Visit Information History Since Last Visit Added or deleted any medications: No Patient Arrived: Ambulatory Had a fall or experienced change in No Arrival Time: 09:06 activities of daily living that may affect Accompanied By: self risk of falls: Transfer Assistance: None Hospitalized since last visit: No Patient Identification Verified: Yes Pain Present Now: No Secondary Verification Process Completed: Yes Patient Has Alerts: Yes Patient Alerts: NOT DIABETIC Electronic Signature(s) Signed: 01/29/2021 3:36:07 PM By: Jeanine Luz Entered By: Jeanine Luz on 01/26/2021 09:09:49 [place], [place]. ([id number]) -------------------------------------------------------------------------------- Clinic Level of Care Assessment Details Patient Name: Nicolas Cousin L. Date of Service: 01/26/2021 9:00 AM Medical Record Number: [id number] Patient Account Number: [id number] Date of Birth/Sex: Jul 24, 1971 (50 y.o. M) Treating RN: Carlene Coria Primary Care Thorne Wirz: PATIENT, NO Other Clinician: Referring Eulalio Reamy: Barkley Boards Treating Tomie Elko/Extender: Skipper Cliche in Treatment: 2 Clinic Level of Care Assessment Items TOOL 1 Quantity Score []  - Use when EandM and Procedure is performed on INITIAL visit 0 ASSESSMENTS - Nursing Assessment / Reassessment []  - General Physical Exam (combine w/ comprehensive assessment (listed just below) when performed on new 0 pt. evals) []  - 0 Comprehensive Assessment (HX, ROS, Risk Assessments, Wounds Hx, etc.) ASSESSMENTS -  Wound and Skin Assessment / Reassessment []  - Dermatologic / Skin Assessment (not related to wound area) 0 ASSESSMENTS - Ostomy and/or Continence Assessment and Care []  - Incontinence Assessment and Management 0 []  - 0 Ostomy Care Assessment and Management (repouching, etc.) PROCESS - Coordination of Care []  - Simple Patient / Family Education for ongoing care 0 []  - 0 Complex (extensive) Patient / Family Education for ongoing care []  - 0 Staff obtains Programmer, systems, Records, Test Results / Process Orders []  - 0 Staff telephones HHA, Nursing Homes / Clarify orders / etc []  - 0 Routine Transfer to another Facility (non-emergent condition) []  - 0 Routine Hospital Admission (non-emergent condition) []  - 0 New Admissions / Biomedical engineer / Ordering NPWT, Apligraf, etc. []  - 0 Emergency Hospital Admission (emergent condition) PROCESS - Special Needs []  - Pediatric / Minor Patient Management 0 []  - 0 Isolation Patient Management []  - 0 Hearing / Language / Visual special needs []  - 0 Assessment of Community assistance (transportation, D/C planning, etc.) []  - 0 Additional assistance / Altered mentation []  - 0 Support Surface(s) Assessment (bed, cushion, seat, etc.) INTERVENTIONS - Miscellaneous []  - External ear exam 0 []  - 0 Patient Transfer (multiple staff / Civil Service fast streamer / Similar devices) []  - 0 Simple Staple / Suture removal (25 or less) []  - 0 Complex Staple / Suture removal (26 or more) []  - 0 Hypo/Hyperglycemic Management (do not check if billed separately) []  - 0 Ankle / Brachial Index (ABI) - do not check if billed separately Has the patient been seen at the hospital within the last three years: Yes Total Score: 0 Level Of Care: ____ [name] ([id number]) Electronic Signature(s) Signed: 01/30/2021 8:01:20 AM By: Carlene Coria RN Entered By: Carlene Coria on 01/26/2021 09:33:46 Borboa, Crayton L.  ([id number]) -------------------------------------------------------------------------------- Encounter Discharge Information Details Patient Name: Nicolas Cousin L. Date of Service: 01/26/2021 9:00 AM Medical Record Number: [id number] Patient Account Number: [id number]  Date of Birth/Sex: 02/27/1971 (50 y.o. M) Treating RN: Donnamarie Poag Primary Care Aicia Babinski: PATIENT, NO Other Clinician: Referring Laurens Matheny: Barkley Boards Treating Aleaha Fickling/Extender: Skipper Cliche in Treatment: 2 Encounter Discharge Information Items Post Procedure Vitals Discharge Condition: Stable Temperature (F): 97.8 Ambulatory Status: Ambulatory Pulse (bpm): 74 Discharge Destination: Home Respiratory Rate (breaths/min): 16 Transportation: Private Auto Blood Pressure (mmHg): 144/76 Accompanied By: self Schedule Follow-up Appointment: Yes Clinical Summary of Care: Electronic Signature(s) Signed: 01/26/2021 12:04:49 PM By: Donnamarie Poag Entered By: Donnamarie Poag on 01/26/2021 09:58:12 [name], Nicolas ([id number]) -------------------------------------------------------------------------------- Lower Extremity Assessment Details Patient Name: Nicolas Cousin L. Date of Service: 01/26/2021 9:00 AM Medical Record Number: [id number] Patient Account Number: [id number] Date of Birth/Sex: [birth year] (50 y.o. M) Treating RN: Carlene Coria Primary Care Dominion Kathan: PATIENT, NO Other Clinician: Referring Yarelin Reichardt: Barkley Boards Treating Adriaan Maltese/Extender: Skipper Cliche in Treatment: 2 Edema Assessment Assessed: [Left: No] [Right: Yes] Edema: [Left: Ye] [Right: s] Calf Left: Right: Point of Measurement: 41 cm From Medial Instep 36 cm Ankle Left: Right: Point of Measurement: 10 cm From Medial Instep 24.7 cm Vascular Assessment Pulses: Dorsalis Pedis Palpable: [Right:Yes] Electronic Signature(s) Signed: 01/29/2021 3:36:07 PM By: Jeanine Luz Signed: 01/30/2021 8:01:20 AM By: Carlene Coria RN Entered By: Jeanine Luz on  01/26/2021 09:21:06 Alvis, Johnathyn L. ([id number]) -------------------------------------------------------------------------------- Multi Wound Chart Details Patient Name: Nicolas Cousin L. Date of Service: 01/26/2021 9:00 AM Medical Record Number: [id number] Patient Account Number: [id number] Date of Birth/Sex: 1970/12/20 (50 y.o. M) Treating RN: Carlene Coria Primary Care Landi Biscardi: PATIENT, NO Other Clinician: Referring Cierah Crader: Barkley Boards Treating Mosiah Bastin/Extender: Skipper Cliche in Treatment: 2 Vital Signs Height(in): 78 Pulse(bpm): 72 Weight(lbs): 249 Blood Pressure(mmHg): 144/76 Body Mass Index(BMI): 29 Temperature(F): 98.2 Respiratory Rate(breaths/min): 18 Photos: [N/A:N/A] Wound Location: Right, Medial Lower Leg N/A N/A Wounding Event: Gradually Appeared N/A N/A Primary Etiology: Venous Leg Ulcer N/A N/A Date Acquired: 11/24/2020 N/A N/A Weeks of Treatment: 2 N/A N/A Wound Status: Open N/A N/A Measurements L x W x D (cm) 0.8x0.8x0.1 N/A N/A Area (cm) : 0.503 N/A N/A Volume (cm) : 0.05 N/A N/A % Reduction in Area: 40.70% N/A N/A % Reduction in Volume: 70.60% N/A N/A Classification: Full Thickness Without Exposed N/A N/A Support Structures Exudate Amount: Medium N/A N/A Exudate Type: Serous N/A N/A Exudate Color: amber N/A N/A Wound Margin: Flat and Intact N/A N/A Granulation Amount: Small (1-33%) N/A N/A Granulation Quality: Pink N/A N/A Necrotic Amount: Large (67-100%) N/A N/A Exposed Structures: Fat Layer (Subcutaneous Tissue): N/A N/A Yes Fascia: No Tendon: No Muscle: No Joint: No Bone: No Epithelialization: None N/A N/A Treatment Notes Electronic Signature(s) Signed: 01/30/2021 8:01:20 AM By: Carlene Coria RN Entered By: Carlene Coria on 01/26/2021 [place], [place] ([id number]) -------------------------------------------------------------------------------- [place] Details Patient Name: Nicolas Cousin L. Date of Service:  01/26/2021 9:00 AM Medical Record Number: [id number] Patient Account Number: [id number] Date of Birth/Sex: 1970-12-02 (50 y.o. M) Treating RN: Carlene Coria Primary Care Charvis Lightner: PATIENT, NO Other Clinician: Referring Lilo Wallington: Barkley Boards Treating Gwin Eagon/Extender: Skipper Cliche in Treatment: 2 Active Inactive Wound/Skin Impairment Nursing Diagnoses: Knowledge deficit related to ulceration/compromised skin integrity Goals: Patient/caregiver will verbalize understanding of skin care regimen Date Initiated: 01/12/2021 Target Resolution Date: 02/12/2021 Goal Status: Active Ulcer/skin breakdown will have a volume reduction of 30% by week 4 Date Initiated: 01/12/2021 Target Resolution Date: 02/12/2021 Goal Status: Active Ulcer/skin breakdown will have a volume reduction of 50% by week 8 Date Initiated: 01/12/2021 Target Resolution Date: 03/14/2021 Goal Status: Active Ulcer/skin breakdown will have  a volume reduction of 80% by week 12 Date Initiated: 01/12/2021 Target Resolution Date: 04/14/2021 Goal Status: Active Ulcer/skin breakdown will heal within 14 weeks Date Initiated: 01/12/2021 Target Resolution Date: 05/15/2021 Goal Status: Active Interventions: Assess patient/caregiver ability to obtain necessary supplies Assess patient/caregiver ability to perform ulcer/skin care regimen upon admission and as needed Assess ulceration(s) every visit Notes: Electronic Signature(s) Signed: 01/30/2021 8:01:20 AM By: Carlene Coria RN Entered By: Carlene Coria on 01/26/2021 09:29:06 [place], [place] ([id number]) -------------------------------------------------------------------------------- Pain Assessment Details Patient Name: Nicolas Cousin L. Date of Service: 01/26/2021 9:00 AM Medical Record Number: [id number] Patient Account Number: [id number] Date of Birth/Sex: 28-Oct-1970 (50 y.o. M) Treating RN: Carlene Coria Primary Care Marillyn Goren: PATIENT, NO Other Clinician: Referring Augusto Deckman: Barkley Boards Treating Aubreyana Saltz/Extender: Skipper Cliche in Treatment: 2 Active Problems Location of Pain Severity and Description of Pain Patient Has Paino No Site Locations Rate the pain. Current Pain Level: 0 Pain Management and Medication Current Pain Management: Electronic Signature(s) Signed: 01/29/2021 3:36:07 PM By: Jeanine Luz Signed: 01/30/2021 8:01:20 AM By: Carlene Coria RN Entered By: Jeanine Luz on 01/26/2021 09:11:48 [name] ([id number]) -------------------------------------------------------------------------------- Patient/Caregiver Education Details Patient Name: JADE, BURRIGHT L. Date of Service: 01/26/2021 9:00 AM Medical Record Number: [id number] Patient Account Number: [id number] Date of Birth/Gender: 1971-07-20 (50 y.o. M) Treating RN: Carlene Coria Primary Care Physician: PATIENT, NO Other Clinician: Referring Physician: Barkley Boards Treating Physician/Extender: Skipper Cliche in Treatment: 2 Education Assessment Education Provided To: Patient Education Topics Provided Wound/Skin Impairment: Methods: Explain/Verbal Responses: State content correctly Electronic Signature(s) Signed: 01/30/2021 8:01:20 AM By: Carlene Coria RN Entered By: Carlene Coria on 01/26/2021 09:34:04 Winch, [place] ([id number]) -------------------------------------------------------------------------------- Wound Assessment Details Patient Name: Nicolas Cousin L. Date of Service: 01/26/2021 9:00 AM Medical Record Number: [id number] Patient Account Number: [id number] Date of Birth/Sex: 10/27/1970 (50 y.o. M) Treating RN: Carlene Coria Primary Care Jorgia Manthei: PATIENT, NO Other Clinician: Referring Belal Scallon: Barkley Boards Treating Marton Malizia/Extender: Skipper Cliche in Treatment: 2 Wound Status Wound Number: 1 Primary Etiology: Venous Leg Ulcer Wound Location: Right, Medial Lower Leg Wound Status: Open Wounding Event: Gradually Appeared Date Acquired: 11/24/2020 Weeks Of  Treatment: 2 Clustered Wound: No Photos Wound Measurements Length: (cm) 0.8 Width: (cm) 0.8 Depth: (cm) 0.1 Area: (cm) 0.503 Volume: (cm) 0.05 % Reduction in Area: 40.7% % Reduction in Volume: 70.6% Epithelialization: None Tunneling: No Undermining: No Wound Description Classification: Full Thickness Without Exposed Support Structures Wound Margin: Flat and Intact Exudate Amount: Medium Exudate Type: Serous Exudate Color: amber Foul Odor After Cleansing: No Slough/Fibrino Yes Wound Bed Granulation Amount: Small (1-33%) Exposed Structure Granulation Quality: Pink Fascia Exposed: No Necrotic Amount: Large (67-100%) Fat Layer (Subcutaneous Tissue) Exposed: Yes Necrotic Quality: Adherent Slough Tendon Exposed: No Muscle Exposed: No Joint Exposed: No Bone Exposed: No Treatment Notes Wound #1 (Lower Leg) Wound Laterality: Right, Medial Cleanser Soap and Water Discharge Instruction: Gently cleanse wound with antibacterial soap, rinse and pat dry prior to dressing wounds Peri-Wound Care Norris, Milo L. ([id number]) Topical Triamcinolone Acetonide Cream, 0.1%, 15 (g) tube Discharge Instruction: Apply as directed by Ashad Fawbush. apply to wound bed Primary Dressing Prisma 4.34 (in) Discharge Instruction: Moisten w/normal saline or sterile water; Cover wound as directed. Do not remove from wound bed. Secondary Dressing ABD Pad 5x9 (in/in) Discharge Instruction: Cover with ABD pad Secured With Compression Wrap Profore Lite LF 3 Multilayer Compression Bandaging System Discharge Instruction: Apply 3 multi-layer wrap as prescribed. Compression Stockings Add-Ons Electronic Signature(s) Signed: 01/29/2021 3:36:07 PM By: Jeanine Luz Signed: 01/30/2021  8:01:20 AM By: Carlene Coria RN Entered By: Jeanine Luz on 01/26/2021 09:19:21 [name] ([id number]) -------------------------------------------------------------------------------- Vitals Details Patient Name:  Nicolas Cousin L. Date of Service: 01/26/2021 9:00 AM Medical Record Number: [id number] Patient Account Number: [id number] Date of Birth/Sex: 1971-02-06 (50 y.o. M) Treating RN: Carlene Coria Primary Care Annikah Lovins: PATIENT, NO Other Clinician: Referring Aemon Koeller: Barkley Boards Treating Eryn Krejci/Extender: Skipper Cliche in Treatment: 2 Vital Signs Time Taken: 09:09 Temperature (F): 98.2 Height (in): 78 Pulse (bpm): 72 Weight (lbs): 249 Respiratory Rate (breaths/min): 18 Body Mass Index (BMI): 28.8 Blood Pressure (mmHg): 144/76 Reference Range: 80 - 120 mg / dl Electronic Signature(s) Signed: 01/29/2021 3:36:07 PM By: Jeanine Luz Entered By: Jeanine Luz on 01/26/2021 09:11:40

## 2021-02-02 ENCOUNTER — Encounter: Payer: Self-pay | Admitting: Physician Assistant

## 2021-02-02 ENCOUNTER — Other Ambulatory Visit: Payer: Self-pay

## 2021-02-02 NOTE — Progress Notes (Signed)
[name], [name] ([id number]) Visit Report for 02/02/2021 Arrival Information Details Patient Name: [name], [name]. Date of Service: 02/02/2021 9:00 AM Medical Record Number: [id number] Patient Account Number: [id number] Date of Birth/Sex: November 02, 1970 (50 y.o. M) Treating RN: Dolan Amen Primary Care Ihor Meinzer: PATIENT, NO Other Clinician: Referring Selvin Yun: Barkley Boards Treating Cy Bresee/Extender: Skipper Cliche in Treatment: 3 Visit Information History Since Last Visit Added or deleted any medications: No Patient Arrived: Ambulatory Hospitalized since last visit: No Arrival Time: 09:18 Has Dressing in Place as Prescribed: Yes Accompanied By: self Pain Present Now: No Transfer Assistance: None Patient Identification Verified: Yes Secondary Verification Process Completed: Yes Patient Has Alerts: Yes Patient Alerts: NOT DIABETIC Electronic Signature(s) Signed: 02/02/2021 4:59:55 PM By: Georges Mouse, Minus Breeding RN Entered By: Georges Mouse, Minus Breeding on 02/02/2021 09:18:29 [name] ([id number]) -------------------------------------------------------------------------------- Clinic Level of Care Assessment Details Patient Name: Nicolas Cousin L. Date of Service: 02/02/2021 9:00 AM Medical Record Number: [id number] Patient Account Number: [id number] Date of Birth/Sex: November 29, 1970 (50 y.o. M) Treating RN: Carlene Coria Primary Care Emmamae Mcnamara: PATIENT, NO Other Clinician: Referring Melesa Lecy: Barkley Boards Treating Dasie Chancellor/Extender: Skipper Cliche in Treatment: 3 Clinic Level of Care Assessment Items TOOL 4 Quantity Score X - Use when only an EandM is performed on FOLLOW-UP visit 1 0 ASSESSMENTS - Nursing Assessment / Reassessment []  - Reassessment of Co-morbidities (includes updates in patient status) 0 []  - 0 Reassessment of Adherence to Treatment Plan ASSESSMENTS - Wound and Skin Assessment / Reassessment X - Simple Wound Assessment / Reassessment - one wound 1 5 []  -  0 Complex Wound Assessment / Reassessment - multiple wounds []  - 0 Dermatologic / Skin Assessment (not related to wound area) ASSESSMENTS - Focused Assessment []  - Circumferential Edema Measurements - multi extremities 0 []  - 0 Nutritional Assessment / Counseling / Intervention []  - 0 Lower Extremity Assessment (monofilament, tuning fork, pulses) []  - 0 Peripheral Arterial Disease Assessment (using hand held doppler) ASSESSMENTS - Ostomy and/or Continence Assessment and Care []  - Incontinence Assessment and Management 0 []  - 0 Ostomy Care Assessment and Management (repouching, etc.) PROCESS - Coordination of Care X - Simple Patient / Family Education for ongoing care 1 15 []  - 0 Complex (extensive) Patient / Family Education for ongoing care []  - 0 Staff obtains Programmer, systems, Records, Test Results / Process Orders []  - 0 Staff telephones HHA, Nursing Homes / Clarify orders / etc []  - 0 Routine Transfer to another Facility (non-emergent condition) []  - 0 Routine Hospital Admission (non-emergent condition) []  - 0 New Admissions / Biomedical engineer / Ordering NPWT, Apligraf, etc. []  - 0 Emergency Hospital Admission (emergent condition) X- 1 10 Simple Discharge Coordination []  - 0 Complex (extensive) Discharge Coordination PROCESS - Special Needs []  - Pediatric / Minor Patient Management 0 []  - 0 Isolation Patient Management []  - 0 Hearing / Language / Visual special needs []  - 0 Assessment of Community assistance (transportation, D/C planning, etc.) []  - 0 Additional assistance / Altered mentation []  - 0 Support Surface(s) Assessment (bed, cushion, seat, etc.) INTERVENTIONS - Wound Cleansing / Measurement [name], [name] ([id number]) X- 1 5 Simple Wound Cleansing - one wound []  - 0 Complex Wound Cleansing - multiple wounds X- 1 5 Wound Imaging (photographs - any number of wounds) []  - 0 Wound Tracing (instead of photographs) X- 1 5 Simple Wound Measurement -  one wound []  - 0 Complex Wound Measurement - multiple wounds INTERVENTIONS - Wound Dressings X - Small Wound Dressing one or multiple wounds  1 10 []  - 0 Medium Wound Dressing one or multiple wounds []  - 0 Large Wound Dressing one or multiple wounds X- 1 5 Application of Medications - topical []  - 0 Application of Medications - injection INTERVENTIONS - Miscellaneous []  - External ear exam 0 []  - 0 Specimen Collection (cultures, biopsies, blood, body fluids, etc.) []  - 0 Specimen(s) / Culture(s) sent or taken to Lab for analysis []  - 0 Patient Transfer (multiple staff / Civil Service fast streamer / Similar devices) []  - 0 Simple Staple / Suture removal (25 or less) []  - 0 Complex Staple / Suture removal (26 or more) []  - 0 Hypo / Hyperglycemic Management (close monitor of Blood Glucose) []  - 0 Ankle / Brachial Index (ABI) - do not check if billed separately X- 1 5 Vital Signs Has the patient been seen at the hospital within the last three years: Yes Total Score: 65 Level Of Care: New/Established - Level 2 Electronic Signature(s) Signed: 02/02/2021 4:40:55 PM By: Carlene Coria RN Entered By: Carlene Coria on 02/02/2021 09:39:51 [name], [name] ([id number]) -------------------------------------------------------------------------------- Encounter Discharge Information Details Patient Name: Nicolas Cousin L. Date of Service: 02/02/2021 9:00 AM Medical Record Number: [id number] Patient Account Number: [id number] Date of Birth/Sex: April 25, 1971 (50 y.o. M) Treating RN: Donnamarie Poag Primary Care Brieanne Mignone: PATIENT, NO Other Clinician: Referring Drayke Grabel: Barkley Boards Treating Leontae Bostock/Extender: Skipper Cliche in Treatment: 3 Encounter Discharge Information Items Discharge Condition: Stable Ambulatory Status: Ambulatory Discharge Destination: Home Transportation: Private Auto Accompanied By: self Schedule Follow-up Appointment: Yes Clinical Summary of Care: Electronic Signature(s) Signed:  02/02/2021 1:59:38 PM By: Donnamarie Poag Entered By: Donnamarie Poag on 02/02/2021 10:01:22 [place], Della Goo ([id number]) -------------------------------------------------------------------------------- Lower Extremity Assessment Details Patient Name: [name], [name] Date of Service: 02/02/2021 9:00 AM Medical Record Number: [id number] Patient Account Number: [id number] Date of Birth/Sex: 11/11/1970 (50 y.o. M) Treating RN: Dolan Amen Primary Care Shreyas Piatkowski: PATIENT, NO Other Clinician: Referring Posey Petrik: Barkley Boards Treating Corrin Sieling/Extender: Skipper Cliche in Treatment: 3 Edema Assessment Assessed: [Left: No] [Right: Yes] Edema: [Left: N] [Right: o] Calf Left: Right: Point of Measurement: 41 cm From Medial Instep 35.5 cm Ankle Left: Right: Point of Measurement: 10 cm From Medial Instep 25.5 cm Vascular Assessment Pulses: Dorsalis Pedis Palpable: [Right:Yes] Electronic Signature(s) Signed: 02/02/2021 4:59:55 PM By: Georges Mouse, Minus Breeding RN Entered By: Georges Mouse, Minus Breeding on 02/02/2021 09:28:41 [place], Ido L. ([id number]) -------------------------------------------------------------------------------- Multi Wound Chart Details Patient Name: Nicolas Cousin L. Date of Service: 02/02/2021 9:00 AM Medical Record Number: [id number] Patient Account Number: [id number] Date of Birth/Sex: October 17, 1970 (50 y.o. M) Treating RN: Carlene Coria Primary Care Krosby Ritchie: PATIENT, NO Other Clinician: Referring Tameria Patti: Barkley Boards Treating Lucine Bilski/Extender: Skipper Cliche in Treatment: 3 Vital Signs Height(in): 78 Pulse(bpm): 27 Weight(lbs): 249 Blood Pressure(mmHg): 146/73 Body Mass Index(BMI): 29 Temperature(F): 98.3 Respiratory Rate(breaths/min): 18 Photos: [N/A:N/A] Wound Location: Right, Medial Lower Leg N/A N/A Wounding Event: Gradually Appeared N/A N/A Primary Etiology: Venous Leg Ulcer N/A N/A Date Acquired: 11/24/2020 N/A N/A Weeks of Treatment: 3 N/A N/A Wound Status:  Open N/A N/A Measurements L x W x D (cm) 1x0.8x0.1 N/A N/A Area (cm) : 0.628 N/A N/A Volume (cm) : 0.063 N/A N/A % Reduction in Area: 25.90% N/A N/A % Reduction in Volume: 62.90% N/A N/A Classification: Full Thickness Without Exposed N/A N/A Support Structures Exudate Amount: Medium N/A N/A Exudate Type: Serous N/A N/A Exudate Color: amber N/A N/A Wound Margin: Flat and Intact N/A N/A Granulation Amount: Small (1-33%) N/A N/A Granulation Quality: Pink N/A N/A Necrotic  Amount: Large (67-100%) N/A N/A Exposed Structures: Fat Layer (Subcutaneous Tissue): N/A N/A Yes Fascia: No Tendon: No Muscle: No Joint: No Bone: No Epithelialization: Small (1-33%) N/A N/A Treatment Notes Electronic Signature(s) Signed: 02/02/2021 4:40:55 PM By: Carlene Coria RN Entered By: Carlene Coria on 02/02/2021 09:35:26 [name], [name] ([id number]) -------------------------------------------------------------------------------- [place] Details Patient Name: Nicolas Cousin L. Date of Service: 02/02/2021 9:00 AM Medical Record Number: [id number] Patient Account Number: [id number] Date of Birth/Sex: [birth year] (50 y.o. M) Treating RN: Carlene Coria Primary Care Perian Tedder: PATIENT, NO Other Clinician: Referring Iva Posten: Barkley Boards Treating Briselda Naval/Extender: Skipper Cliche in Treatment: 3 Active Inactive Wound/Skin Impairment Nursing Diagnoses: Knowledge deficit related to ulceration/compromised skin integrity Goals: Patient/caregiver will verbalize understanding of skin care regimen Date Initiated: 01/12/2021 Target Resolution Date: 02/12/2021 Goal Status: Active Ulcer/skin breakdown will have a volume reduction of 30% by week 4 Date Initiated: 01/12/2021 Target Resolution Date: 02/12/2021 Goal Status: Active Ulcer/skin breakdown will have a volume reduction of 50% by week 8 Date Initiated: 01/12/2021 Target Resolution Date: 03/14/2021 Goal Status: Active Ulcer/skin breakdown  will have a volume reduction of 80% by week 12 Date Initiated: 01/12/2021 Target Resolution Date: 04/14/2021 Goal Status: Active Ulcer/skin breakdown will heal within 14 weeks Date Initiated: 01/12/2021 Target Resolution Date: 05/15/2021 Goal Status: Active Interventions: Assess patient/caregiver ability to obtain necessary supplies Assess patient/caregiver ability to perform ulcer/skin care regimen upon admission and as needed Assess ulceration(s) every visit Notes: Electronic Signature(s) Signed: 02/02/2021 4:40:55 PM By: Carlene Coria RN Entered By: Carlene Coria on 02/02/2021 09:35:11 [name], [name] ([id number]) -------------------------------------------------------------------------------- Pain Assessment Details Patient Name: Nicolas Cousin L. Date of Service: 02/02/2021 9:00 AM Medical Record Number: [id number] Patient Account Number: [id number] Date of Birth/Sex: June 08, 1971 (50 y.o. M) Treating RN: Dolan Amen Primary Care Anysa Tacey: PATIENT, NO Other Clinician: Referring Lemuel Boodram: Barkley Boards Treating Jaquese Irving/Extender: Skipper Cliche in Treatment: 3 Active Problems Location of Pain Severity and Description of Pain Patient Has Paino No Site Locations Rate the pain. Current Pain Level: 0 Pain Management and Medication Current Pain Management: Electronic Signature(s) Signed: 02/02/2021 4:59:55 PM By: Georges Mouse, Minus Breeding RN Entered By: Georges Mouse, [place] on 02/02/2021 09:20:35 [name] ([id number]) -------------------------------------------------------------------------------- Patient/Caregiver Education Details Patient Name: Nicolas Cousin L. Date of Service: 02/02/2021 9:00 AM Medical Record Number: [id number] Patient Account Number: [id number] Date of Birth/Gender: 11/08/1970 (50 y.o. M) Treating RN: Carlene Coria Primary Care Physician: PATIENT, NO Other Clinician: Referring Physician: Barkley Boards Treating Physician/Extender: Skipper Cliche in  Treatment: 3 Education Assessment Education Provided To: Patient Education Topics Provided Wound/Skin Impairment: Methods: Explain/Verbal Responses: State content correctly Electronic Signature(s) Signed: 02/02/2021 4:40:55 PM By: Carlene Coria RN Entered By: Carlene Coria on 02/02/2021 09:40:06 [place], [place] ([id number]) -------------------------------------------------------------------------------- Wound Assessment Details Patient Name: Nicolas Cousin L. Date of Service: 02/02/2021 9:00 AM Medical Record Number: [id number] Patient Account Number: [id number] Date of Birth/Sex: 12-Sep-1970 (50 y.o. M) Treating RN: Dolan Amen Primary Care Danaka Llera: PATIENT, NO Other Clinician: Referring Kenner Lewan: Barkley Boards Treating Koltan Portocarrero/Extender: Skipper Cliche in Treatment: 3 Wound Status Wound Number: 1 Primary Etiology: Venous Leg Ulcer Wound Location: Right, Medial Lower Leg Wound Status: Open Wounding Event: Gradually Appeared Date Acquired: 11/24/2020 Weeks Of Treatment: 3 Clustered Wound: No Photos Wound Measurements Length: (cm) 1 Width: (cm) 0.8 Depth: (cm) 0.1 Area: (cm) 0.628 Volume: (cm) 0.063 % Reduction in Area: 25.9% % Reduction in Volume: 62.9% Epithelialization: Large (67-100%) Tunneling: No Undermining: No Wound Description Classification: Full Thickness Without Exposed Support Structu Wound Margin: Flat  and Intact Exudate Amount: Medium Exudate Type: Serous Exudate Color: amber res Foul Odor After Cleansing: No Slough/Fibrino Yes Wound Bed Granulation Amount: Large (67-100%) Exposed Structure Granulation Quality: Pink Fascia Exposed: No Necrotic Amount: None Present (0%) Fat Layer (Subcutaneous Tissue) Exposed: Yes Tendon Exposed: No Muscle Exposed: No Joint Exposed: No Bone Exposed: No Treatment Notes Wound #1 (Lower Leg) Wound Laterality: Right, Medial Cleanser Soap and Water Discharge Instruction: Gently cleanse wound with antibacterial  soap, rinse and pat dry prior to dressing wounds Peri-Wound Care [name], [name] ([id number]) Topical Primary Dressing Prisma 4.34 (in) Discharge Instruction: Moisten with hydrogel or ky jelly Secondary Dressing Bordered Gauze Sterile-HBD 4x4 (in/in) Discharge Instruction: Cover wound with Bordered Guaze Sterile as directed Secured With Compression Wrap Compression Stockings Add-Ons Electronic Signature(s) Signed: 02/02/2021 9:40:20 AM By: Charlett Nose RN Previous Signature: 02/02/2021 9:40:01 AM Version By: Georges Mouse, Minus Breeding RN Entered By: Georges Mouse, Minus Breeding on 02/02/2021 09:40:19 [place], [place] ([id number]) -------------------------------------------------------------------------------- Vitals Details Patient Name: Nicolas Cousin L. Date of Service: 02/02/2021 9:00 AM Medical Record Number: [id number] Patient Account Number: [id number] Date of Birth/Sex: 07-24-1971 (50 y.o. M) Treating RN: Dolan Amen Primary Care Sophiana Milanese: PATIENT, NO Other Clinician: Referring Cathye Kreiter: Barkley Boards Treating Colston Pyle/Extender: Skipper Cliche in Treatment: 3 Vital Signs Time Taken: 09:18 Temperature (F): 98.3 Height (in): 78 Pulse (bpm): 71 Weight (lbs): 249 Respiratory Rate (breaths/min): 18 Body Mass Index (BMI): 28.8 Blood Pressure (mmHg): 146/73 Reference Range: 80 - 120 mg / dl Electronic Signature(s) Signed: 02/02/2021 4:59:55 PM By: Georges Mouse, Minus Breeding RN Entered By: Georges Mouse, Minus Breeding on 02/02/2021 09:20:23

## 2021-02-02 NOTE — Progress Notes (Addendum)
KALLIN, HENK ([id number]) Visit Report for 02/02/2021 Chief Complaint Document Details Patient Name: [name], [name]. Date of Service: 02/02/2021 9:00 AM Medical Record Number: [id number] Patient Account Number: [id number] Date of Birth/Sex: [birth year] (50 y.o. M) Treating RN: Carlene Coria Primary Care Provider: PATIENT, NO Other Clinician: Referring Provider: Barkley Boards Treating Provider/Extender: Skipper Cliche in Treatment: 3 Information Obtained from: Patient Chief Complaint Right LE Ulcer Electronic Signature(s) Signed: 02/02/2021 9:32:22 AM By: Worthy Keeler PA-C Entered By: Worthy Keeler on 02/02/2021 09:32:21 [name] ([id number]) -------------------------------------------------------------------------------- HPI Details Patient Name: Nicolas Cousin L. Date of Service: 02/02/2021 9:00 AM Medical Record Number: [id number] Patient Account Number: [id number] Date of Birth/Sex: [birth year] (50 y.o. M) Treating RN: Carlene Coria Primary Care Provider: PATIENT, NO Other Clinician: Referring Provider: Barkley Boards Treating Provider/Extender: Skipper Cliche in Treatment: 3 History of Present Illness HPI Description: 01/12/2021 upon evaluation today patient appears to be doing somewhat poorly in regard to a wound on his right medial lower leg. Currently he tells me that this has been going on since around the beginning of April. He has done a telehealth visit with a physician who initially gave him a triamcinolone cream along with Keflex for 5 days. Subsequently he ended up being seen at walk-in urgent care where they also given Keflex for 7 days that seem to be doing better for him. Overall he tells me that things have improved from the standpoint of redness. Fortunately there does not appear to be any signs of systemic infection to be honest right now even locally I do not see any evidence of infection right now. He does have 1 main wound with some scattering areas that  look like they may have been small ulcerations but again for the most part these have filled in. This does look almost to be more of a vasculitis type scenario based on what I am seeing. Patient does have chronic venous insufficiency but is not wearing any compression even though it sounds like he has been told before he should. 5/27; patient with small wounds on his right medial ankle. He has chronic venous insufficiency and stasis dermatitis a large open wound and several smaller areas all of which looks better than on presentation last week according to her nursing staff 01/26/2021 upon evaluation today patient appears to be doing well with regard to his wound. He is making good progress. With that being said there is some need currently for sharp debridement in regard to the wound there is some necrotic debris it is almost completely cleaned off but not completely. He is in agreement with that plan today. Fortunately there does not appear to be any evidence of active infection which is great and I am very pleased in that regard. He still wishes he did not have to wear the compression wrap. He did get his compression socks from elastic therapy though they are in the 15 to 20 mmHg range they did not have any his length as he is a tall guy in the 20 to 30 mmHg range. 02/02/2021 upon evaluation today patient appears to be doing excellent in regard to his leg ulcer. He has been tolerating the dressing changes without complication. Fortunately there is no evidence of active infection at this time. No fevers, chills, nausea, vomiting, or diarrhea. Electronic Signature(s) Signed: 02/02/2021 1:42:16 PM By: Worthy Keeler PA-C Entered By: Worthy Keeler on 02/02/2021 13:42:16 Cletus Gash, Kord Carlean Jews ([id number]) -------------------------------------------------------------------------------- Physical Exam Details Patient Name: [name], [name] L.  Date of Service: 02/02/2021 9:00 AM Medical Record Number:  [id number] Patient Account Number: [id number] Date of Birth/Sex: 06/19/1971 (49 y.o. M) Treating RN: Carlene Coria Primary Care Provider: PATIENT, NO Other Clinician: Referring Provider: Barkley Boards Treating Provider/Extender: Skipper Cliche in Treatment: 3 Constitutional Well-nourished and well-hydrated in no acute distress. Respiratory normal breathing without difficulty. Psychiatric this patient is able to make decisions and demonstrates good insight into disease process. Alert and Oriented x 3. pleasant and cooperative. Notes Patient's wound bed showed signs of good granulation epithelization at this point. There does not appear to be any evidence of active infection which is great and overall I am extremely pleased with where things stand currently. Electronic Signature(s) Signed: 02/02/2021 1:42:30 PM By: Worthy Keeler PA-C Entered By: Worthy Keeler on 02/02/2021 13:42:30 [name] ([id number]) -------------------------------------------------------------------------------- Physician Orders Details Patient Name: [name], [name] Date of Service: 02/02/2021 9:00 AM Medical Record Number: [id number] Patient Account Number: [id number] Date of Birth/Sex: [birth year] (50 y.o. M) Treating RN: Carlene Coria Primary Care Provider: PATIENT, NO Other Clinician: Referring Provider: Barkley Boards Treating Provider/Extender: Skipper Cliche in Treatment: 3 Verbal / Phone Orders: No Diagnosis Coding ICD-10 Coding Code Description I87.2 Venous insufficiency (chronic) (peripheral) L97.812 Non-pressure chronic ulcer of other part of right lower leg with fat layer exposed Follow-up Appointments o Return Appointment in 1 week. Bathing/ Shower/ Hygiene o May shower with wound dressing protected with water repellent cover or cast protector. Edema Control - Lymphedema / Segmental Compressive Device / Other o Patient to wear own compression stockings. Remove compression stockings  every night before going to bed and put on every morning when getting up. - right and left lower leg o Elevate, Exercise Daily and Avoid Standing for Long Periods of Time. o Elevate legs to the level of the heart and pump ankles as often as possible o Elevate leg(s) parallel to the floor when sitting. Wound Treatment Wound #1 - Lower Leg Wound Laterality: Right, Medial Cleanser: Soap and Water 3 x Per Week/30 Days Discharge Instructions: Gently cleanse wound with antibacterial soap, rinse and pat dry prior to dressing wounds Primary Dressing: Prisma 4.34 (in) (Generic) 3 x Per Week/30 Days Discharge Instructions: Moisten with hydrogel or ky jelly Secondary Dressing: Bordered Gauze Sterile-HBD 4x4 (in/in) 3 x Per Week/30 Days Discharge Instructions: Cover wound with Bordered Guaze Sterile as directed Electronic Signature(s) Signed: 02/02/2021 4:40:55 PM By: Carlene Coria RN Signed: 02/02/2021 5:52:53 PM By: Worthy Keeler PA-C Entered By: Carlene Coria on 02/02/2021 09:55:52 Keckler, Hicks L. ([id number]) -------------------------------------------------------------------------------- Problem List Details Patient Name: KAYDEN, HUTMACHER L. Date of Service: 02/02/2021 9:00 AM Medical Record Number: [id number] Patient Account Number: [id number] Date of Birth/Sex: 09-28-1970 (50 y.o. M) Treating RN: Carlene Coria Primary Care Provider: PATIENT, NO Other Clinician: Referring Provider: Barkley Boards Treating Provider/Extender: Skipper Cliche in Treatment: 3 Active Problems ICD-10 Encounter Code Description Active Date MDM Diagnosis I87.2 Venous insufficiency (chronic) (peripheral) 01/12/2021 No Yes L97.812 Non-pressure chronic ulcer of other part of right lower leg with fat layer 01/12/2021 No Yes exposed Inactive Problems Resolved Problems Electronic Signature(s) Signed: 02/02/2021 9:32:15 AM By: Worthy Keeler PA-C Entered By: Worthy Keeler on 02/02/2021 09:32:14 Oleksy, Kenai L.  ([id number]) -------------------------------------------------------------------------------- Progress Note Details Patient Name: Nicolas Cousin L. Date of Service: 02/02/2021 9:00 AM Medical Record Number: [id number] Patient Account Number: [id number] Date of Birth/Sex: 12/25/1970 (50 y.o. M) Treating RN: Carlene Coria Primary Care Provider: PATIENT, NO Other Clinician: Referring Provider: Barkley Boards Treating Provider/Extender:  Jeri Cos Weeks in Treatment: 3 Subjective Chief Complaint Information obtained from Patient Right LE Ulcer History of Present Illness (HPI) 01/12/2021 upon evaluation today patient appears to be doing somewhat poorly in regard to a wound on his right medial lower leg. Currently he tells me that this has been going on since around the beginning of April. He has done a telehealth visit with a physician who initially gave him a triamcinolone cream along with Keflex for 5 days. Subsequently he ended up being seen at walk-in urgent care where they also given Keflex for 7 days that seem to be doing better for him. Overall he tells me that things have improved from the standpoint of redness. Fortunately there does not appear to be any signs of systemic infection to be honest right now even locally I do not see any evidence of infection right now. He does have 1 main wound with some scattering areas that look like they may have been small ulcerations but again for the most part these have filled in. This does look almost to be more of a vasculitis type scenario based on what I am seeing. Patient does have chronic venous insufficiency but is not wearing any compression even though it sounds like he has been told before he should. 5/27; patient with small wounds on his right medial ankle. He has chronic venous insufficiency and stasis dermatitis a large open wound and several smaller areas all of which looks better than on presentation last week according to her nursing  staff 01/26/2021 upon evaluation today patient appears to be doing well with regard to his wound. He is making good progress. With that being said there is some need currently for sharp debridement in regard to the wound there is some necrotic debris it is almost completely cleaned off but not completely. He is in agreement with that plan today. Fortunately there does not appear to be any evidence of active infection which is great and I am very pleased in that regard. He still wishes he did not have to wear the compression wrap. He did get his compression socks from elastic therapy though they are in the 15 to 20 mmHg range they did not have any his length as he is a tall guy in the 20 to 30 mmHg range. 02/02/2021 upon evaluation today patient appears to be doing excellent in regard to his leg ulcer. He has been tolerating the dressing changes without complication. Fortunately there is no evidence of active infection at this time. No fevers, chills, nausea, vomiting, or diarrhea. Objective Constitutional Well-nourished and well-hydrated in no acute distress. Vitals Time Taken: 9:18 AM, Height: 78 in, Weight: 249 lbs, BMI: 28.8, Temperature: 98.3 F, Pulse: 71 bpm, Respiratory Rate: 18 breaths/min, Blood Pressure: 146/73 mmHg. Respiratory normal breathing without difficulty. Psychiatric this patient is able to make decisions and demonstrates good insight into disease process. Alert and Oriented x 3. pleasant and cooperative. General Notes: Patient's wound bed showed signs of good granulation epithelization at this point. There does not appear to be any evidence of active infection which is great and overall I am extremely pleased with where things stand currently. Integumentary (Hair, Skin) Wound #1 status is Open. Original cause of wound was Gradually Appeared. The date acquired was: 11/24/2020. The wound has been in treatment 3 weeks. The wound is located on the Right,Medial Lower Leg. The wound  measures 1cm length x 0.8cm width x 0.1cm depth; 0.628cm^2 area and 0.063cm^3 volume. There is Fat Layer (Subcutaneous  Tissue) exposed. There is no tunneling or undermining noted. There is a medium amount of serous drainage noted. The wound margin is flat and intact. There is large (67-100%) pink granulation within the wound bed. There is no necrotic tissue within the wound bed. KEETCH, [place] ([id number]) Assessment Active Problems ICD-10 Venous insufficiency (chronic) (peripheral) Non-pressure chronic ulcer of other part of right lower leg with fat layer exposed Plan Follow-up Appointments: Return Appointment in 1 week. Bathing/ Shower/ Hygiene: May shower with wound dressing protected with water repellent cover or cast protector. Edema Control - Lymphedema / Segmental Compressive Device / Other: Patient to wear own compression stockings. Remove compression stockings every night before going to bed and put on every morning when getting up. - right and left lower leg Elevate, Exercise Daily and Avoid Standing for Long Periods of Time. Elevate legs to the level of the heart and pump ankles as often as possible Elevate leg(s) parallel to the floor when sitting. WOUND #1: - Lower Leg Wound Laterality: Right, Medial Cleanser: Soap and Water 3 x Per Week/30 Days Discharge Instructions: Gently cleanse wound with antibacterial soap, rinse and pat dry prior to dressing wounds Primary Dressing: Prisma 4.34 (in) (Generic) 3 x Per Week/30 Days Discharge Instructions: Moisten with hydrogel or ky jelly Secondary Dressing: Bordered Gauze Sterile-HBD 4x4 (in/in) 3 x Per Week/30 Days Discharge Instructions: Cover wound with Bordered Guaze Sterile as directed 1. I am also going to suggest that we going continue with the silver collagen dominant actually switch is not rapid but let him actually change this at home to try to keep the collagen from drying out we will also can use hydrogel. 2. I am also  can recommend that he uses compression sock over top of this he can shower and change it every other day. 3. I am also can recommend he continue to elevate his legs much as possible when he is at home. We will see patient back for reevaluation in 1 week here in the clinic. If anything worsens or changes patient will contact our office for additional recommendations. Electronic Signature(s) Signed: 02/02/2021 1:43:01 PM By: Worthy Keeler PA-C Entered By: Worthy Keeler on 02/02/2021 13:43:01 Pomeroy, [place] L. ([id number]) -------------------------------------------------------------------------------- SuperBill Details Patient Name: Nicolas Cousin L. Date of Service: 02/02/2021 Medical Record Number: [id number] Patient Account Number: [id number] Date of Birth/Sex: Dec 24, 1970 (50 y.o. M) Treating RN: Carlene Coria Primary Care Provider: PATIENT, NO Other Clinician: Referring Provider: Barkley Boards Treating Provider/Extender: Skipper Cliche in Treatment: 3 Diagnosis Coding ICD-10 Codes Code Description I87.2 Venous insufficiency (chronic) (peripheral) L97.812 Non-pressure chronic ulcer of other part of right lower leg with fat layer exposed Facility Procedures CPT4 Code: [id number] Description: [phone] - WOUND CARE VISIT-LEV 2 EST PT Modifier: Quantity: 1 Physician Procedures CPT4 Code: [id number] Description: [id number] - WC PHYS LEVEL 3 - EST PT Modifier: Quantity: 1 CPT4 Code: Description: ICD-10 Diagnosis Description I87.2 Venous insufficiency (chronic) (peripheral) L97.812 Non-pressure chronic ulcer of other part of right lower leg with fat la Modifier: yer exposed Quantity: Electronic Signature(s) Signed: 02/02/2021 1:43:15 PM By: Worthy Keeler PA-C Entered By: Worthy Keeler on 02/02/2021 13:43:15

## 2021-02-09 ENCOUNTER — Other Ambulatory Visit: Payer: Self-pay

## 2021-02-09 ENCOUNTER — Encounter: Payer: Self-pay | Admitting: Physician Assistant

## 2021-02-09 NOTE — Progress Notes (Addendum)
[name] ([id number]) Visit Report for 02/09/2021 Chief Complaint Document Details Patient Name: [name], [name]. Date of Service: 02/09/2021 9:00 AM Medical Record Number: [id number] Patient Account Number: [id number] Date of Birth/Sex: 1971/04/22 (50 y.o. M) Treating RN: Carlene Coria Primary Care Provider: PATIENT, NO Other Clinician: Referring Provider: Barkley Boards Treating Provider/Extender: Skipper Cliche in Treatment: 4 Information Obtained from: Patient Chief Complaint Right LE Ulcer Electronic Signature(s) Signed: 02/09/2021 9:26:40 AM By: Worthy Keeler PA-C Entered By: Worthy Keeler on 02/09/2021 09:26:39 [name], [name] ([id number]) -------------------------------------------------------------------------------- Debridement Details Patient Name: Nicolas Cousin L. Date of Service: 02/09/2021 9:00 AM Medical Record Number: [id number] Patient Account Number: [id number] Date of Birth/Sex: 01/11/1971 (50 y.o. M) Treating RN: Carlene Coria Primary Care Provider: PATIENT, NO Other Clinician: Referring Provider: Barkley Boards Treating Provider/Extender: Skipper Cliche in Treatment: 4 Debridement Performed for Wound #1 Right,Medial Lower Leg Assessment: Performed By: Physician Tommie Sams., PA-C Debridement Type: Debridement Severity of Tissue Pre Debridement: Fat layer exposed Level of Consciousness (Pre- Awake and Alert procedure): Pre-procedure Verification/Time Out Yes - 10:08 Taken: Start Time: 10:08 Pain Control: Lidocaine 4% Topical Solution Total Area Debrided (L x W): 0.8 (cm) x 0.7 (cm) = 0.56 (cm) Tissue and other material Viable, Non-Viable, Slough, Subcutaneous, Skin: Dermis , Skin: Epidermis, Slough debrided: Level: Skin/Subcutaneous Tissue Debridement Description: Excisional Instrument: Curette Bleeding: Minimum Hemostasis Achieved: Pressure End Time: 10:12 Procedural Pain: 0 Post Procedural Pain: 0 Response to Treatment: Procedure was  tolerated well Level of Consciousness (Post- Awake and Alert procedure): Post Debridement Measurements of Total Wound Length: (cm) 0.8 Width: (cm) 0.7 Depth: (cm) 0.1 Volume: (cm) 0.044 Character of Wound/Ulcer Post Debridement: Improved Severity of Tissue Post Debridement: Fat layer exposed Post Procedure Diagnosis Same as Pre-procedure Electronic Signature(s) Signed: 02/09/2021 4:51:48 PM By: Worthy Keeler PA-C Signed: 02/14/2021 4:27:57 PM By: Carlene Coria RN Entered By: Carlene Coria on 02/09/2021 10:11:32 [name], [name] ([id number]) -------------------------------------------------------------------------------- HPI Details Patient Name: Nicolas Cousin L. Date of Service: 02/09/2021 9:00 AM Medical Record Number: [id number] Patient Account Number: [id number] Date of Birth/Sex: Nov 26, 1970 (50 y.o. M) Treating RN: Carlene Coria Primary Care Provider: PATIENT, NO Other Clinician: Referring Provider: Barkley Boards Treating Provider/Extender: Skipper Cliche in Treatment: 4 History of Present Illness HPI Description: 01/12/2021 upon evaluation today patient appears to be doing somewhat poorly in regard to a wound on his right medial lower leg. Currently he tells me that this has been going on since around the beginning of April. He has done a telehealth visit with a physician who initially gave him a triamcinolone cream along with Keflex for 5 days. Subsequently he ended up being seen at walk-in urgent care where they also given Keflex for 7 days that seem to be doing better for him. Overall he tells me that things have improved from the standpoint of redness. Fortunately there does not appear to be any signs of systemic infection to be honest right now even locally I do not see any evidence of infection right now. He does have 1 main wound with some scattering areas that look like they may have been small ulcerations but again for the most part these have filled in. This does look  almost to be more of a vasculitis type scenario based on what I am seeing. Patient does have chronic venous insufficiency but is not wearing any compression even though it sounds like he has been told before he should. 5/27; patient with small wounds on his right medial ankle.  He has chronic venous insufficiency and stasis dermatitis a large open wound and several smaller areas all of which looks better than on presentation last week according to her nursing staff 01/26/2021 upon evaluation today patient appears to be doing well with regard to his wound. He is making good progress. With that being said there is some need currently for sharp debridement in regard to the wound there is some necrotic debris it is almost completely cleaned off but not completely. He is in agreement with that plan today. Fortunately there does not appear to be any evidence of active infection which is great and I am very pleased in that regard. He still wishes he did not have to wear the compression wrap. He did get his compression socks from elastic therapy though they are in the 15 to 20 mmHg range they did not have any his length as he is a tall guy in the 20 to 30 mmHg range. 02/02/2021 upon evaluation today patient appears to be doing excellent in regard to his leg ulcer. He has been tolerating the dressing changes without complication. Fortunately there is no evidence of active infection at this time. No fevers, chills, nausea, vomiting, or diarrhea. 02/09/2021 upon evaluation today patient's wound actually showing signs of improvement. Fortunately there does not appear to be any evidence of active infection which is great news overall very pleased with where things stand today. Electronic Signature(s) Signed: 02/09/2021 10:24:51 AM By: Worthy Keeler PA-C Previous Signature: 02/09/2021 10:24:26 AM Version By: Worthy Keeler PA-C Entered By: Worthy Keeler on 02/09/2021 10:24:51 [name]  ([id number]) -------------------------------------------------------------------------------- Physical Exam Details Patient Name: [name], [name] Date of Service: 02/09/2021 9:00 AM Medical Record Number: [id number] Patient Account Number: [id number] Date of Birth/Sex: 1970-11-11 (50 y.o. M) Treating RN: Carlene Coria Primary Care Provider: PATIENT, NO Other Clinician: Referring Provider: Barkley Boards Treating Provider/Extender: Skipper Cliche in Treatment: 4 Constitutional Well-nourished and well-hydrated in no acute distress. Respiratory normal breathing without difficulty. Psychiatric this patient is Nicolas to make decisions and demonstrates good insight into disease process. Alert and Oriented x 3. pleasant and cooperative. Notes Patient's wound bed showed signs of good granulation epithelization at this point. Fortunately there does not appear to be any evidence of active infection which is great news and overall I am extremely pleased with where things stand at this point. I did have to perform some sharp debridement to clear away some of the necrotic debris. He tolerated that today without complication. Fortunately postdebridement wound bed appeared to be doing a little bit better I did have to use a little bit of silver nitrate to achieve hemostasis. Electronic Signature(s) Signed: 02/09/2021 10:25:03 AM By: Worthy Keeler PA-C Entered By: Worthy Keeler on 02/09/2021 10:25:03 [name] ([id number]) -------------------------------------------------------------------------------- Physician Orders Details Patient Name: [name], [name] Date of Service: 02/09/2021 9:00 AM Medical Record Number: [id number] Patient Account Number: [id number] Date of Birth/Sex: 05-23-1971 (50 y.o. M) Treating RN: Carlene Coria Primary Care Provider: PATIENT, NO Other Clinician: Referring Provider: Barkley Boards Treating Provider/Extender: Skipper Cliche in Treatment: 4 Verbal / Phone Orders:  No Diagnosis Coding ICD-10 Coding Code Description I87.2 Venous insufficiency (chronic) (peripheral) L97.812 Non-pressure chronic ulcer of other part of right lower leg with fat layer exposed Follow-up Appointments o Return Appointment in 1 week. Bathing/ Shower/ Hygiene o May shower with wound dressing protected with water repellent cover or cast protector. Edema Control - Lymphedema / Segmental Compressive Device / Other o  Patient to wear own compression stockings. Remove compression stockings every night before going to bed and put on every morning when getting up. - right and left lower leg o Elevate, Exercise Daily and Avoid Standing for Long Periods of Time. o Elevate legs to the level of the heart and pump ankles as often as possible o Elevate leg(s) parallel to the floor when sitting. Wound Treatment Wound #1 - Lower Leg Wound Laterality: Right, Medial Cleanser: Soap and Water 3 x Per Week/30 Days Discharge Instructions: Gently cleanse wound with antibacterial soap, rinse and pat dry prior to dressing wounds Primary Dressing: Prisma 4.34 (in) (Generic) 3 x Per Week/30 Days Discharge Instructions: Moisten with hydrogel or ky jelly Secondary Dressing: Bordered Gauze Sterile-HBD 4x4 (in/in) 3 x Per Week/30 Days Discharge Instructions: Cover wound with Bordered Guaze Sterile as directed Electronic Signature(s) Signed: 02/09/2021 4:51:48 PM By: Worthy Keeler PA-C Signed: 02/14/2021 4:27:57 PM By: Carlene Coria RN Entered By: Carlene Coria on 02/09/2021 [place], [place] ([id number]) -------------------------------------------------------------------------------- Problem List Details Patient Name: [name], [name] Date of Service: 02/09/2021 9:00 AM Medical Record Number: [id number] Patient Account Number: [id number] Date of Birth/Sex: Feb 02, 1971 (50 y.o. M) Treating RN: Carlene Coria Primary Care Provider: PATIENT, NO Other Clinician: Referring Provider: Barkley Boards Treating Provider/Extender: Skipper Cliche in Treatment: 4 Active Problems ICD-10 Encounter Code Description Active Date MDM Diagnosis I87.2 Venous insufficiency (chronic) (peripheral) 01/12/2021 No Yes L97.812 Non-pressure chronic ulcer of other part of right lower leg with fat layer 01/12/2021 No Yes exposed Inactive Problems Resolved Problems Electronic Signature(s) Signed: 02/09/2021 9:26:33 AM By: Worthy Keeler PA-C Entered By: Worthy Keeler on 02/09/2021 09:26:33 [name], [name] ([id number]) -------------------------------------------------------------------------------- Progress Note Details Patient Name: Nicolas Cousin L. Date of Service: 02/09/2021 9:00 AM Medical Record Number: [id number] Patient Account Number: [id number] Date of Birth/Sex: [birth year] (50 y.o. M) Treating RN: Carlene Coria Primary Care Provider: PATIENT, NO Other Clinician: Referring Provider: Barkley Boards Treating Provider/Extender: Skipper Cliche in Treatment: 4 Subjective Chief Complaint Information obtained from Patient Right LE Ulcer History of Present Illness (HPI) 01/12/2021 upon evaluation today patient appears to be doing somewhat poorly in regard to a wound on his right medial lower leg. Currently he tells me that this has been going on since around the beginning of April. He has done a telehealth visit with a physician who initially gave him a triamcinolone cream along with Keflex for 5 days. Subsequently he ended up being seen at walk-in urgent care where they also given Keflex for 7 days that seem to be doing better for him. Overall he tells me that things have improved from the standpoint of redness. Fortunately there does not appear to be any signs of systemic infection to be honest right now even locally I do not see any evidence of infection right now. He does have 1 main wound with some scattering areas that look like they may have been small ulcerations but again for the most  part these have filled in. This does look almost to be more of a vasculitis type scenario based on what I am seeing. Patient does have chronic venous insufficiency but is not wearing any compression even though it sounds like he has been told before he should. 5/27; patient with small wounds on his right medial ankle. He has chronic venous insufficiency and stasis dermatitis a large open wound and several smaller areas all of which looks better than on presentation last week according to her nursing staff 01/26/2021  upon evaluation today patient appears to be doing well with regard to his wound. He is making good progress. With that being said there is some need currently for sharp debridement in regard to the wound there is some necrotic debris it is almost completely cleaned off but not completely. He is in agreement with that plan today. Fortunately there does not appear to be any evidence of active infection which is great and I am very pleased in that regard. He still wishes he did not have to wear the compression wrap. He did get his compression socks from elastic therapy though they are in the 15 to 20 mmHg range they did not have any his length as he is a tall guy in the 20 to 30 mmHg range. 02/02/2021 upon evaluation today patient appears to be doing excellent in regard to his leg ulcer. He has been tolerating the dressing changes without complication. Fortunately there is no evidence of active infection at this time. No fevers, chills, nausea, vomiting, or diarrhea. 02/09/2021 upon evaluation today patient's wound actually showing signs of improvement. Fortunately there does not appear to be any evidence of active infection which is great news overall very pleased with where things stand today. Objective Constitutional Well-nourished and well-hydrated in no acute distress. Vitals Time Taken: 9:41 AM, Height: 78 in, Weight: 249 lbs, BMI: 28.8, Temperature: 98.2 F, Pulse: 69 bpm, Respiratory  Rate: 18 breaths/min, Blood Pressure: 145/75 mmHg. Respiratory normal breathing without difficulty. Psychiatric this patient is Nicolas to make decisions and demonstrates good insight into disease process. Alert and Oriented x 3. pleasant and cooperative. General Notes: Patient's wound bed showed signs of good granulation epithelization at this point. Fortunately there does not appear to be any evidence of active infection which is great news and overall I am extremely pleased with where things stand at this point. I did have to perform some sharp debridement to clear away some of the necrotic debris. He tolerated that today without complication. Fortunately postdebridement wound bed appeared to be doing a little bit better I did have to use a little bit of silver nitrate to achieve hemostasis. Integumentary (Hair, Skin) Wound #1 status is Open. Original cause of wound was Gradually Appeared. The date acquired was: 11/24/2020. The wound has been in treatment 4 weeks. The wound is located on the Right,Medial Lower Leg. The wound measures 0.8cm length x 0.7cm width x 0.1cm depth; 0.44cm^2 area and 0.044cm^3 volume. There is Fat Layer (Subcutaneous Tissue) exposed. There is no tunneling or undermining noted. There is a medium amount of serous drainage noted. The wound margin is flat and intact. There is medium (34-66%) red, pink granulation within the wound bed. [name], [name] ([id number]) There is a medium (34-66%) amount of necrotic tissue within the wound bed including Adherent Slough. Assessment Active Problems ICD-10 Venous insufficiency (chronic) (peripheral) Non-pressure chronic ulcer of other part of right lower leg with fat layer exposed Procedures Wound #1 Pre-procedure diagnosis of Wound #1 is a Venous Leg Ulcer located on the Right,Medial Lower Leg .Severity of Tissue Pre Debridement is: Fat layer exposed. There was a Excisional Skin/Subcutaneous Tissue Debridement with a total area of  0.56 sq cm performed by Tommie Sams., PA-C. With the following instrument(s): Curette to remove Viable and Non-Viable tissue/material. Material removed includes Subcutaneous Tissue, Slough, Skin: Dermis, and Skin: Epidermis after achieving pain control using Lidocaine 4% Topical Solution. No specimens were taken. A time out was conducted at 10:08, prior to the start of the  procedure. A Minimum amount of bleeding was controlled with Pressure. The procedure was tolerated well with a pain level of 0 throughout and a pain level of 0 following the procedure. Post Debridement Measurements: 0.8cm length x 0.7cm width x 0.1cm depth; 0.044cm^3 volume. Character of Wound/Ulcer Post Debridement is improved. Severity of Tissue Post Debridement is: Fat layer exposed. Post procedure Diagnosis Wound #1: Same as Pre-Procedure Plan Follow-up Appointments: Return Appointment in 1 week. Bathing/ Shower/ Hygiene: May shower with wound dressing protected with water repellent cover or cast protector. Edema Control - Lymphedema / Segmental Compressive Device / Other: Patient to wear own compression stockings. Remove compression stockings every night before going to bed and put on every morning when getting up. - right and left lower leg Elevate, Exercise Daily and Avoid Standing for Long Periods of Time. Elevate legs to the level of the heart and pump ankles as often as possible Elevate leg(s) parallel to the floor when sitting. WOUND #1: - Lower Leg Wound Laterality: Right, Medial Cleanser: Soap and Water 3 x Per Week/30 Days Discharge Instructions: Gently cleanse wound with antibacterial soap, rinse and pat dry prior to dressing wounds Primary Dressing: Prisma 4.34 (in) (Generic) 3 x Per Week/30 Days Discharge Instructions: Moisten with hydrogel or ky jelly Secondary Dressing: Bordered Gauze Sterile-HBD 4x4 (in/in) 3 x Per Week/30 Days Discharge Instructions: Cover wound with Bordered Guaze Sterile as  directed 1. Would recommend currently that we going continue with wound care measures as before and the patient is in agreement with the plan. This includes the use of silver collagen dressing which I think is still doing a good job for him. 2. I am also going to recommend the border gauze dressing to cover or a large Band-Aid I think that is appropriate. 3. I am also can recommend he continue to monitor for anything worsening such as increased pain that would indicate infection. Nonetheless overall I think he is doing quite well. We will see patient back for reevaluation in 1 week here in the clinic. If anything worsens or changes patient will contact our office for additional recommendations. Electronic Signature(s) Signed: 02/09/2021 10:25:40 AM By: Worthy Keeler PA-C Entered By: Worthy Keeler on 02/09/2021 10:25:40 [name], [name] ([id number]) [name], [name] ([id number]) -------------------------------------------------------------------------------- SuperBill Details Patient Name: JAMOL, GINYARD L. Date of Service: 02/09/2021 Medical Record Number: [id number] Patient Account Number: [id number] Date of Birth/Sex: 01-Sep-1970 (50 y.o. M) Treating RN: Carlene Coria Primary Care Provider: PATIENT, NO Other Clinician: Referring Provider: Barkley Boards Treating Provider/Extender: Skipper Cliche in Treatment: 4 Diagnosis Coding ICD-10 Codes Code Description I87.2 Venous insufficiency (chronic) (peripheral) L97.812 Non-pressure chronic ulcer of other part of right lower leg with fat layer exposed Facility Procedures CPT4 Code: [id number] Description: 41740 - DEB SUBQ TISSUE 20 SQ CM/< Modifier: Quantity: 1 CPT4 Code: Description: ICD-10 Diagnosis Description C14.481 Non-pressure chronic ulcer of other part of right lower leg with fat lay Modifier: er exposed Quantity: Physician Procedures CPT4 Code: [id number] Description: [place] - WC PHYS SUBQ TISS 20 SQ CM Modifier: Quantity:  1 CPT4 Code: Description: ICD-10 Diagnosis Description F02.637 Non-pressure chronic ulcer of other part of right lower leg with fat lay Modifier: er exposed Quantity: Electronic Signature(s) Signed: 02/09/2021 10:25:55 AM By: Worthy Keeler PA-C Entered By: Worthy Keeler on 02/09/2021 10:25:55

## 2021-02-12 NOTE — Progress Notes (Addendum)
[name], [name] ([id number]) Visit Report for 02/09/2021 Arrival Information Details Patient Name: [name], [name]. Date of Service: 02/09/2021 9:00 AM Medical Record Number: [id number] Patient Account Number: [id number] Date of Birth/Sex: April 08, 1971 (50 y.o. M) Treating RN: Carlene Coria Primary Care [name]: PATIENT, NO Other Clinician: Referring Wilmer Berryhill: [name] Treating Lakhia Gengler/Extender: [name] in Treatment: 4 Visit Information History Since Last Visit Added or deleted any medications: No Patient Arrived: Ambulatory Had a fall or experienced change in No Arrival Time: 09:40 activities of daily living that may affect Accompanied By: self risk of falls: Transfer Assistance: None Hospitalized since last visit: No Patient Identification Verified: Yes Pain Present Now: No Secondary Verification Process Completed: Yes Patient Has Alerts: Yes Patient Alerts: NOT DIABETIC Electronic Signature(s) Signed: 02/13/2021 4:23:36 PM By: Jeanine Luz Entered By: Jeanine Luz on 02/09/2021 09:40:39 Speedy, [place]. ([id number]) -------------------------------------------------------------------------------- Clinic Level of Care Assessment Details Patient Name: Nicolas Cousin L. Date of Service: 02/09/2021 9:00 AM Medical Record Number: [id number] Patient Account Number: [id number] Date of Birth/Sex: 1970/10/21 (50 y.o. M) Treating RN: Carlene Coria Primary Care Derril Franek: PATIENT, NO Other Clinician: Referring Chelsey Kimberley: [name] Treating Elienai Gailey/Extender: [name] in Treatment: 4 Clinic Level of Care Assessment Items TOOL 1 Quantity Score []  - Use when EandM and Procedure is performed on INITIAL visit 0 ASSESSMENTS - Nursing Assessment / Reassessment []  - General Physical Exam (combine w/ comprehensive assessment (listed just below) when performed on new 0 pt. evals) []  - 0 Comprehensive Assessment (HX, ROS, Risk Assessments, Wounds Hx, etc.) ASSESSMENTS -  Wound and Skin Assessment / Reassessment []  - Dermatologic / Skin Assessment (not related to wound area) 0 ASSESSMENTS - Ostomy and/or Continence Assessment and Care []  - Incontinence Assessment and Management 0 []  - 0 Ostomy Care Assessment and Management (repouching, etc.) PROCESS - Coordination of Care []  - Simple Patient / Family Education for ongoing care 0 []  - 0 Complex (extensive) Patient / Family Education for ongoing care []  - 0 Staff obtains Programmer, systems, Records, Test Results / Process Orders []  - 0 Staff telephones HHA, Nursing Homes / Clarify orders / etc []  - 0 Routine Transfer to another Facility (non-emergent condition) []  - 0 Routine Hospital Admission (non-emergent condition) []  - 0 New Admissions / Biomedical engineer / Ordering NPWT, Apligraf, etc. []  - 0 Emergency Hospital Admission (emergent condition) PROCESS - Special Needs []  - Pediatric / Minor Patient Management 0 []  - 0 Isolation Patient Management []  - 0 Hearing / Language / Visual special needs []  - 0 Assessment of Community assistance (transportation, D/C planning, etc.) []  - 0 Additional assistance / Altered mentation []  - 0 Support Surface(s) Assessment (bed, cushion, seat, etc.) INTERVENTIONS - Miscellaneous []  - External ear exam 0 []  - 0 Patient Transfer (multiple staff / Civil Service fast streamer / Similar devices) []  - 0 Simple Staple / Suture removal (25 or less) []  - 0 Complex Staple / Suture removal (26 or more) []  - 0 Hypo/Hyperglycemic Management (do not check if billed separately) []  - 0 Ankle / Brachial Index (ABI) - do not check if billed separately Has the patient been seen at the hospital within the last three years: Yes Total Score: 0 Level Of Care: ____ [name] ([id number]) Electronic Signature(s) Signed: 02/14/2021 4:27:57 PM By: Carlene Coria RN Entered By: Carlene Coria on 02/09/2021 10:10:08 [name]  ([id number]) -------------------------------------------------------------------------------- Encounter Discharge Information Details Patient Name: Nicolas Cousin L. Date of Service: 02/09/2021 9:00 AM Medical Record Number: [id number] Patient Account Number: [id number]  Date of Birth/Sex: Dec 06, 1970 (50 y.o. M) Treating RN: Dolan Amen Primary Care Nakiah Osgood: PATIENT, NO Other Clinician: Referring Lavren Lewan: [name] Treating Yen Wandell/Extender: [name] in Treatment: 4 Encounter Discharge Information Items Post Procedure Vitals Discharge Condition: Stable Temperature (F): 98.2 Ambulatory Status: Ambulatory Pulse (bpm): 69 Discharge Destination: Home Respiratory Rate (breaths/min): 18 Transportation: Private Auto Blood Pressure (mmHg): 145/75 Accompanied By: self Schedule Follow-up Appointment: Yes Clinical Summary of Care: Electronic Signature(s) Signed: 02/09/2021 4:57:24 PM By: Georges Mouse, Minus Breeding RN Entered By: Georges Mouse, Minus Breeding on 02/09/2021 10:26:26 [name] ([id number]) -------------------------------------------------------------------------------- Lower Extremity Assessment Details Patient Name: Nicolas Cousin L. Date of Service: 02/09/2021 9:00 AM Medical Record Number: [id number] Patient Account Number: [id number] Date of Birth/Sex: 12/01/1970 (50 y.o. M) Treating RN: Carlene Coria Primary Care Ewel Lona: PATIENT, NO Other Clinician: Referring Yarisa Lynam: [name] Treating Eiden Bagot/Extender: [name] in Treatment: 4 Edema Assessment Assessed: [Left: No] [Right: Yes] Edema: [Left: Ye] [Right: s] Calf Left: Right: Point of Measurement: 41 cm From Medial Instep 37.6 cm Ankle Left: Right: Point of Measurement: 10 cm From Medial Instep 25 cm Vascular Assessment Pulses: Dorsalis Pedis Palpable: [Right:Yes] Electronic Signature(s) Signed: 02/13/2021 4:23:36 PM By: Jeanine Luz Signed: 02/14/2021 4:27:57 PM By: Carlene Coria  RN Entered By: Jeanine Luz on 02/09/2021 09:50:39 Gertz, [place] ([id number]) -------------------------------------------------------------------------------- Multi Wound Chart Details Patient Name: Nicolas Cousin L. Date of Service: 02/09/2021 9:00 AM Medical Record Number: [id number] Patient Account Number: [id number] Date of Birth/Sex: 16-Feb-1971 (50 y.o. M) Treating RN: Carlene Coria Primary Care Aleksa Catterton: PATIENT, NO Other Clinician: Referring Seydou Hearns: [name] Treating Bibi Economos/Extender: [name] in Treatment: 4 Vital Signs Height(in): 78 Pulse(bpm): 69 Weight(lbs): 249 Blood Pressure(mmHg): 145/75 Body Mass Index(BMI): 29 Temperature(F): 98.2 Respiratory Rate(breaths/min): 18 Photos: [N/A:N/A] Wound Location: Right, Medial Lower Leg N/A N/A Wounding Event: Gradually Appeared N/A N/A Primary Etiology: Venous Leg Ulcer N/A N/A Date Acquired: 11/24/2020 N/A N/A Weeks of Treatment: 4 N/A N/A Wound Status: Open N/A N/A Measurements L x W x D (cm) 0.8x0.7x0.1 N/A N/A Area (cm) : 0.44 N/A N/A Volume (cm) : 0.044 N/A N/A % Reduction in Area: 48.10% N/A N/A % Reduction in Volume: 74.10% N/A N/A Classification: Full Thickness Without Exposed N/A N/A Support Structures Exudate Amount: Medium N/A N/A Exudate Type: Serous N/A N/A Exudate Color: amber N/A N/A Wound Margin: Flat and Intact N/A N/A Granulation Amount: Medium (34-66%) N/A N/A Granulation Quality: Red, Pink N/A N/A Necrotic Amount: Medium (34-66%) N/A N/A Exposed Structures: Fat Layer (Subcutaneous Tissue): N/A N/A Yes Fascia: No Tendon: No Muscle: No Joint: No Bone: No Epithelialization: None N/A N/A Treatment Notes Electronic Signature(s) Signed: 02/14/2021 4:27:57 PM By: Carlene Coria RN Entered By: Carlene Coria on 02/09/2021 10:08:49 [name] ([id number]) -------------------------------------------------------------------------------- [place] Details Patient  Name: Nicolas Cousin L. Date of Service: 02/09/2021 9:00 AM Medical Record Number: [id number] Patient Account Number: [id number] Date of Birth/Sex: [birth year] (50 y.o. M) Treating RN: Carlene Coria Primary Care Callum Wolf: PATIENT, NO Other Clinician: Referring Jerry Haugen: [name] Treating Ogechi Kuehnel/Extender: [name] in Treatment: 4 Active Inactive Wound/Skin Impairment Nursing Diagnoses: Knowledge deficit related to ulceration/compromised skin integrity Goals: Patient/caregiver will verbalize understanding of skin care regimen Date Initiated: 01/12/2021 Target Resolution Date: 02/12/2021 Goal Status: Active Ulcer/skin breakdown will have a volume reduction of 30% by week 4 Date Initiated: 01/12/2021 Target Resolution Date: 02/12/2021 Goal Status: Active Ulcer/skin breakdown will have a volume reduction of 50% by week 8 Date Initiated: 01/12/2021 Target Resolution Date: 03/14/2021 Goal Status: Active  Ulcer/skin breakdown will have a volume reduction of 80% by week 12 Date Initiated: 01/12/2021 Target Resolution Date: 04/14/2021 Goal Status: Active Ulcer/skin breakdown will heal within 14 weeks Date Initiated: 01/12/2021 Target Resolution Date: 05/15/2021 Goal Status: Active Interventions: Assess patient/caregiver ability to obtain necessary supplies Assess patient/caregiver ability to perform ulcer/skin care regimen upon admission and as needed Assess ulceration(s) every visit Notes: Electronic Signature(s) Signed: 02/14/2021 4:27:57 PM By: Carlene Coria RN Entered By: Carlene Coria on 02/09/2021 10:08:36 Buchler, [place]. ([id number]) -------------------------------------------------------------------------------- Pain Assessment Details Patient Name: Nicolas Cousin L. Date of Service: 02/09/2021 9:00 AM Medical Record Number: [id number] Patient Account Number: [id number] Date of Birth/Sex: 04/27/1971 (50 y.o. M) Treating RN: Carlene Coria Primary Care Amaiah Cristiano: PATIENT, NO Other  Clinician: Referring Lexia Vandevender: [name] Treating Savva Beamer/Extender: [name] in Treatment: 4 Active Problems Location of Pain Severity and Description of Pain Patient Has Paino No Site Locations Rate the pain. Current Pain Level: 0 Pain Management and Medication Current Pain Management: Electronic Signature(s) Signed: 02/13/2021 4:23:36 PM By: Jeanine Luz Signed: 02/14/2021 4:27:57 PM By: Carlene Coria RN Entered By: Jeanine Luz on 02/09/2021 09:44:55 Distler, Jaryan Carlean Jews ([id number]) -------------------------------------------------------------------------------- Patient/Caregiver Education Details Patient Name: Nicolas Cousin L. Date of Service: 02/09/2021 9:00 AM Medical Record Number: [id number] Patient Account Number: [id number] Date of Birth/Gender: 1970-11-19 (50 y.o. M) Treating RN: Carlene Coria Primary Care Physician: PATIENT, NO Other Clinician: Referring Physician: Barkley [name] Treating Physician/Extender: [name] in Treatment: 4 Education Assessment Education Provided To: Patient Education Topics Provided Wound/Skin Impairment: Methods: Explain/Verbal Responses: State content correctly Electronic Signature(s) Signed: 02/14/2021 4:27:57 PM By: Carlene Coria RN Entered By: Carlene Coria on 02/09/2021 10:11:54 Gerald, [place] ([id number]) -------------------------------------------------------------------------------- Wound Assessment Details Patient Name: JAGER, KOSKA L. Date of Service: 02/09/2021 9:00 AM Medical Record Number: [id number] Patient Account Number: [id number] Date of Birth/Sex: 02-08-1971 (50 y.o. M) Treating RN: Carlene Coria Primary Care Cecely Rengel: PATIENT, NO Other Clinician: Referring Razia Screws: [name] Treating Timica Marcom/Extender: [name] in Treatment: 4 Wound Status Wound Number: 1 Primary Etiology: Venous Leg Ulcer Wound Location: Right, Medial Lower Leg Wound Status: Open Wounding Event: Gradually  Appeared Date Acquired: 11/24/2020 Weeks Of Treatment: 4 Clustered Wound: No Photos Wound Measurements Length: (cm) 0.8 Width: (cm) 0.7 Depth: (cm) 0.1 Area: (cm) 0.44 Volume: (cm) 0.044 % Reduction in Area: 48.1% % Reduction in Volume: 74.1% Epithelialization: None Tunneling: No Undermining: No Wound Description Classification: Full Thickness Without Exposed Support Structures Wound Margin: Flat and Intact Exudate Amount: Medium Exudate Type: Serous Exudate Color: amber Foul Odor After Cleansing: No Slough/Fibrino Yes Wound Bed Granulation Amount: Medium (34-66%) Exposed Structure Granulation Quality: Red, Pink Fascia Exposed: No Necrotic Amount: Medium (34-66%) Fat Layer (Subcutaneous Tissue) Exposed: Yes Necrotic Quality: Adherent Slough Tendon Exposed: No Muscle Exposed: No Joint Exposed: No Bone Exposed: No Treatment Notes Wound #1 (Lower Leg) Wound Laterality: Right, Medial Cleanser Soap and Water Discharge Instruction: Gently cleanse wound with antibacterial soap, rinse and pat dry prior to dressing wounds Peri-Wound Care SRIHAAN, MASTRANGELO L. ([id number]) Topical Primary Dressing Prisma 4.34 (in) Discharge Instruction: Moisten with hydrogel or ky jelly Secondary Dressing Bordered Gauze Sterile-HBD 4x4 (in/in) Discharge Instruction: Cover wound with Bordered Guaze Sterile as directed Secured With Compression Wrap Compression Stockings Add-Ons Electronic Signature(s) Signed: 02/13/2021 4:23:36 PM By: Jeanine Luz Signed: 02/14/2021 4:27:57 PM By: Carlene Coria RN Entered By: Jeanine Luz on 02/09/2021 Tom Green, [place] ([id number]) -------------------------------------------------------------------------------- Vitals Details Patient Name: Nicolas Cousin L. Date of Service: 02/09/2021 9:00 AM Medical Record Number:  [id number] Patient Account Number: [id number] Date of Birth/Sex: 07/14/1971 (50 y.o. M) Treating RN: Carlene Coria Primary Care  Nicolaas Savo: PATIENT, NO Other Clinician: Referring Tameya Kuznia: [name] Treating Mackson Botz/Extender: [name] in Treatment: 4 Vital Signs Time Taken: 09:41 Temperature (F): 98.2 Height (in): 78 Pulse (bpm): 69 Weight (lbs): 249 Respiratory Rate (breaths/min): 18 Body Mass Index (BMI): 28.8 Blood Pressure (mmHg): 145/75 Reference Range: 80 - 120 mg / dl Electronic Signature(s) Signed: 02/13/2021 4:23:36 PM By: Jeanine Luz Entered By: Jeanine Luz on 02/09/2021 09:44:47

## 2021-02-16 ENCOUNTER — Other Ambulatory Visit: Payer: Self-pay

## 2021-02-16 ENCOUNTER — Encounter: Payer: Self-pay | Admitting: Physician Assistant

## 2021-02-16 NOTE — Progress Notes (Addendum)
DEV, DHONDT ([id number]) Visit Report for 02/16/2021 Chief Complaint Document Details Patient Name: [name], [name]. Date of Service: 02/16/2021 9:00 AM Medical Record Number: [id number] Patient Account Number: [id number] Date of Birth/Sex: 07-Jul-1971 (50 y.o. Male) Treating RN: Carlene Coria Primary Care Provider: PATIENT, NO Other Clinician: Referring Provider: Barkley Boards Treating Provider/Extender: Skipper Cliche in Treatment: 5 Information Obtained from: Patient Chief Complaint Right LE Ulcer Electronic Signature(s) Signed: 02/16/2021 9:34:10 AM By: Worthy Keeler PA-C Entered By: Worthy Keeler on 02/16/2021 09:34:09 Heath, [name] ([id number]) -------------------------------------------------------------------------------- HPI Details Patient Name: Nicolas Cousin L. Date of Service: 02/16/2021 9:00 AM Medical Record Number: [id number] Patient Account Number: [id number] Date of Birth/Sex: September 18, 1970 (50 y.o. Male) Treating RN: Carlene Coria Primary Care Provider: PATIENT, NO Other Clinician: Referring Provider: Barkley Boards Treating Provider/Extender: Skipper Cliche in Treatment: 5 History of Present Illness HPI Description: 01/12/2021 upon evaluation today patient appears to be doing somewhat poorly in regard to a wound on his right medial lower leg. Currently he tells me that this has been going on since around the beginning of April. He has done a telehealth visit with a physician who initially gave him a triamcinolone cream along with Keflex for 5 days. Subsequently he ended up being seen at walk-in urgent care where they also given Keflex for 7 days that seem to be doing better for him. Overall he tells me that things have improved from the standpoint of redness. Fortunately there does not appear to be any signs of systemic infection to be honest right now even locally I do not see any evidence of infection right now. He does have 1 main wound with some scattering areas  that look like they may have been small ulcerations but again for the most part these have filled in. This does look almost to be more of a vasculitis type scenario based on what I am seeing. Patient does have chronic venous insufficiency but is not wearing any compression even though it sounds like he has been told before he should. 5/27; patient with small wounds on his right medial ankle. He has chronic venous insufficiency and stasis dermatitis a large open wound and several smaller areas all of which looks better than on presentation last week according to her nursing staff 01/26/2021 upon evaluation today patient appears to be doing well with regard to his wound. He is making good progress. With that being said there is some need currently for sharp debridement in regard to the wound there is some necrotic debris it is almost completely cleaned off but not completely. He is in agreement with that plan today. Fortunately there does not appear to be any evidence of active infection which is great and I am very pleased in that regard. He still wishes he did not have to wear the compression wrap. He did get his compression socks from [facility] though they are in the 15 to 20 mmHg range they did not have any his length as he is a tall guy in the 20 to 30 mmHg range. 02/02/2021 upon evaluation today patient appears to be doing excellent in regard to his leg ulcer. He has been tolerating the dressing changes without complication. Fortunately there is no evidence of active infection at this time. No fevers, chills, nausea, vomiting, or diarrhea. 02/09/2021 upon evaluation today patient's wound actually showing signs of improvement. Fortunately there does not appear to be any evidence of active infection which is great news overall very pleased with where  things stand today. 02/16/2021 upon evaluation today patient's wound is showing some signs of improvement. He did have a fairly aggressive debridement  last week he notes that he had quite a bit of discomfort he was some swelling following. With that being said I think this was to be expected considering what we had to do from a debridement standpoint. The good news is there does not appear to be any evidence of infection currently nonetheless I do believe that the patient may have some signs of vasculitis here. Based on the overall appearance of the wound today and beginning to suspect this. For that reason I think he would benefit from starting him on triamcinolone ointment to the wound bed and some of the immediate periwound to try to help out in this regard. Electronic Signature(s) Signed: 02/16/2021 6:01:20 PM By: Worthy Keeler PA-C Entered By: Worthy Keeler on 02/16/2021 18:01:20 [name] ([id number]) -------------------------------------------------------------------------------- Physical Exam Details Patient Name: [name], [name] Date of Service: 02/16/2021 9:00 AM Medical Record Number: [id number] Patient Account Number: [id number] Date of Birth/Sex: July 08, 1971 (50 y.o. Male) Treating RN: Carlene Coria Primary Care Provider: PATIENT, NO Other Clinician: Referring Provider: Barkley Boards Treating Provider/Extender: Skipper Cliche in Treatment: 5 Constitutional Well-nourished and well-hydrated in no acute distress. Respiratory normal breathing without difficulty. Psychiatric this patient is able to make decisions and demonstrates good insight into disease process. Alert and Oriented x 3. pleasant and cooperative. Notes Patient's wound bed did not require sharp debridement today had some slough noted but I think the best thing is probably can to be to let the ointment work for a little bit here. He does note he will also be going out of town to [place]. Obviously this is quite a big trip but I think he will be okay he has his compression socks and to be honest as well as wearing that I think walking is good to be good for  him as well so that will not be a problem. He tells me that his wife wants to go see the [employer]. I hope he has a great trip there. Electronic Signature(s) Signed: 02/16/2021 6:01:50 PM By: Worthy Keeler PA-C Entered By: Worthy Keeler on 02/16/2021 18:01:49 [name] ([id number]) -------------------------------------------------------------------------------- Physician Orders Details Patient Name: LAKSHYA, MCGILLICUDDY L. Date of Service: 02/16/2021 9:00 AM Medical Record Number: [id number] Patient Account Number: [id number] Date of Birth/Sex: 1971-03-25 (50 y.o. Male) Treating RN: Carlene Coria Primary Care Provider: PATIENT, NO Other Clinician: Referring Provider: Barkley Boards Treating Provider/Extender: Skipper Cliche in Treatment: 5 Verbal / Phone Orders: No Diagnosis Coding ICD-10 Coding Code Description I87.2 Venous insufficiency (chronic) (peripheral) L97.812 Non-pressure chronic ulcer of other part of right lower leg with fat layer exposed Follow-up Appointments o Return Appointment in 1 week. Bathing/ Shower/ Hygiene o May shower with wound dressing protected with water repellent cover or cast protector. Edema Control - Lymphedema / Segmental Compressive Device / Other o Patient to wear own compression stockings. Remove compression stockings every night before going to bed and put on every morning when getting up. - right and left lower leg o Elevate, Exercise Daily and Avoid Standing for Long Periods of Time. o Elevate legs to the level of the heart and pump ankles as often as possible o Elevate leg(s) parallel to the floor when sitting. Wound Treatment Wound #1 - Lower Leg Wound Laterality: Right, Medial Cleanser: Soap and Water 1 x Per Day/30 Days Discharge Instructions: Gently cleanse wound with antibacterial  soap, rinse and pat dry prior to dressing wounds Peri-Wound Care: Triamcinolone Acetonide Cream, 0.1%, 15 (g) tube 1 x Per Day/30 Days Discharge  Instructions: apply to peri area and wound bed Primary Dressing: Hydrofera Blue Ready Transfer Foam, 4x5 (in/in) 1 x Per Day/30 Days Discharge Instructions: Apply Hydrofera Blue Ready to wound bed as directed Secondary Dressing: Bordered Gauze Sterile-HBD 4x4 (in/in) 1 x Per Day/30 Days Discharge Instructions: Cover wound with Bordered Guaze Sterile as directed Patient Medications Allergies: No Known Drug Allergies Notifications Medication Indication Start End triamcinolone acetonide 02/16/2021 DOSE topical 0.1 % ointment - ointment topical applied with each dressing change to the wound bed and surrounding skin as directed in the clinic Electronic Signature(s) Signed: 02/16/2021 2:52:11 PM By: Worthy Keeler PA-C Entered By: Worthy Keeler on 02/16/2021 14:52:11 [name], [name] ([id number]) -------------------------------------------------------------------------------- Problem List Details Patient Name: Nicolas Cousin L. Date of Service: 02/16/2021 9:00 AM Medical Record Number: [id number] Patient Account Number: [id number] Date of Birth/Sex: 10/01/1970 (50 y.o. Male) Treating RN: Carlene Coria Primary Care Provider: PATIENT, NO Other Clinician: Referring Provider: Barkley Boards Treating Provider/Extender: Skipper Cliche in Treatment: 5 Active Problems ICD-10 Encounter Code Description Active Date MDM Diagnosis I87.2 Venous insufficiency (chronic) (peripheral) 01/12/2021 No Yes L97.812 Non-pressure chronic ulcer of other part of right lower leg with fat layer 01/12/2021 No Yes exposed Inactive Problems Resolved Problems Electronic Signature(s) Signed: 02/16/2021 9:34:04 AM By: Worthy Keeler PA-C Entered By: Worthy Keeler on 02/16/2021 09:34:04 [name], [name] ([id number]) -------------------------------------------------------------------------------- Progress Note Details Patient Name: Nicolas Cousin L. Date of Service: 02/16/2021 9:00 AM Medical Record Number:  [id number] Patient Account Number: [id number] Date of Birth/Sex: 11-21-1970 (50 y.o. Male) Treating RN: Carlene Coria Primary Care Provider: PATIENT, NO Other Clinician: Referring Provider: Barkley Boards Treating Provider/Extender: Skipper Cliche in Treatment: 5 Subjective Chief Complaint Information obtained from Patient Right LE Ulcer History of Present Illness (HPI) 01/12/2021 upon evaluation today patient appears to be doing somewhat poorly in regard to a wound on his right medial lower leg. Currently he tells me that this has been going on since around the beginning of April. He has done a telehealth visit with a physician who initially gave him a triamcinolone cream along with Keflex for 5 days. Subsequently he ended up being seen at walk-in urgent care where they also given Keflex for 7 days that seem to be doing better for him. Overall he tells me that things have improved from the standpoint of redness. Fortunately there does not appear to be any signs of systemic infection to be honest right now even locally I do not see any evidence of infection right now. He does have 1 main wound with some scattering areas that look like they may have been small ulcerations but again for the most part these have filled in. This does look almost to be more of a vasculitis type scenario based on what I am seeing. Patient does have chronic venous insufficiency but is not wearing any compression even though it sounds like he has been told before he should. 5/27; patient with small wounds on his right medial ankle. He has chronic venous insufficiency and stasis dermatitis a large open wound and several smaller areas all of which looks better than on presentation last week according to her nursing staff 01/26/2021 upon evaluation today patient appears to be doing well with regard to his wound. He is making good progress. With that being said there is some need currently for sharp debridement  in regard to the  wound there is some necrotic debris it is almost completely cleaned off but not completely. He is in agreement with that plan today. Fortunately there does not appear to be any evidence of active infection which is great and I am very pleased in that regard. He still wishes he did not have to wear the compression wrap. He did get his compression socks from [facility] though they are in the 15 to 20 mmHg range they did not have any his length as he is a tall guy in the 20 to 30 mmHg range. 02/02/2021 upon evaluation today patient appears to be doing excellent in regard to his leg ulcer. He has been tolerating the dressing changes without complication. Fortunately there is no evidence of active infection at this time. No fevers, chills, nausea, vomiting, or diarrhea. 02/09/2021 upon evaluation today patient's wound actually showing signs of improvement. Fortunately there does not appear to be any evidence of active infection which is great news overall very pleased with where things stand today. 02/16/2021 upon evaluation today patient's wound is showing some signs of improvement. He did have a fairly aggressive debridement last week he notes that he had quite a bit of discomfort he was some swelling following. With that being said I think this was to be expected considering what we had to do from a debridement standpoint. The good news is there does not appear to be any evidence of infection currently nonetheless I do believe that the patient may have some signs of vasculitis here. Based on the overall appearance of the wound today and beginning to suspect this. For that reason I think he would benefit from starting him on triamcinolone ointment to the wound bed and some of the immediate periwound to try to help out in this regard. Objective Constitutional Well-nourished and well-hydrated in no acute distress. Vitals Time Taken: 9:25 AM, Height: 78 in, Weight: 249 lbs, BMI: 28.8, Temperature: 98.4  F, Pulse: 60 bpm, Respiratory Rate: 16 breaths/min, Blood Pressure: 133/79 mmHg. Respiratory normal breathing without difficulty. Psychiatric this patient is able to make decisions and demonstrates good insight into disease process. Alert and Oriented x 3. pleasant and cooperative. General Notes: Patient's wound bed did not require sharp debridement today had some slough noted but I think the best thing is probably can to be to let the ointment work for a little bit here. He does note he will also be going out of town to [place]. Obviously this is quite a big trip but I think he will be okay he has his compression socks and to be honest as well as wearing that I think walking is good to be good for him as well so [name], [name] ([id number]) that will not be a problem. He tells me that his wife wants to go see the [employer]. I hope he has a great trip there. Integumentary (Hair, Skin) Wound #1 status is Open. Original cause of wound was Gradually Appeared. The date acquired was: 11/24/2020. The wound has been in treatment 5 weeks. The wound is located on the Right,Medial Lower Leg. The wound measures 0.8cm length x 0.8cm width x 0.2cm depth; 0.503cm^2 area and 0.101cm^3 volume. There is Fat Layer (Subcutaneous Tissue) exposed. There is no tunneling or undermining noted. There is a medium amount of serous drainage noted. The wound margin is flat and intact. There is medium (34-66%) red, pink granulation within the wound bed. There is a medium (34-66%) amount of necrotic tissue  within the wound bed including Adherent Slough. Assessment Active Problems ICD-10 Venous insufficiency (chronic) (peripheral) Non-pressure chronic ulcer of other part of right lower leg with fat layer exposed Plan Follow-up Appointments: Return Appointment in 1 week. Bathing/ Shower/ Hygiene: May shower with wound dressing protected with water repellent cover or cast protector. Edema Control - Lymphedema / Segmental  Compressive Device / Other: Patient to wear own compression stockings. Remove compression stockings every night before going to bed and put on every morning when getting up. - right and left lower leg Elevate, Exercise Daily and Avoid Standing for Long Periods of Time. Elevate legs to the level of the heart and pump ankles as often as possible Elevate leg(s) parallel to the floor when sitting. The following medication(s) was prescribed: triamcinolone acetonide topical 0.1 % ointment ointment topical applied with each dressing change to the wound bed and surrounding skin as directed in the clinic starting 02/16/2021 WOUND #1: - Lower Leg Wound Laterality: Right, Medial Cleanser: Soap and Water 1 x Per Day/30 Days Discharge Instructions: Gently cleanse wound with antibacterial soap, rinse and pat dry prior to dressing wounds Peri-Wound Care: Triamcinolone Acetonide Cream, 0.1%, 15 (g) tube 1 x Per Day/30 Days Discharge Instructions: apply to peri area and wound bed Primary Dressing: Hydrofera Blue Ready Transfer Foam, 4x5 (in/in) 1 x Per Day/30 Days Discharge Instructions: Apply Hydrofera Blue Ready to wound bed as directed Secondary Dressing: Bordered Gauze Sterile-HBD 4x4 (in/in) 1 x Per Day/30 Days Discharge Instructions: Cover wound with Bordered Guaze Sterile as directed 1. I am going to recommend currently that we initiate treatment with triamcinolone to the wound bed and the patient is in agreement with the plan. 2. I am also can recommend that we have the patient continue to use Hydrofera Blue to cover I think this is due to be better with the triamcinolone then doing the collagen. 3. I am also going to recommend the border gauze to cover we gave him the information for what to order off of [facility] I think this is going to do much better for him than the over-the-counter bandages he has been getting. We will see patient back for reevaluation in 1 week here in the clinic. If anything  worsens or changes patient will contact our office for additional recommendations. Electronic Signature(s) Signed: 02/16/2021 6:02:32 PM By: Worthy Keeler PA-C Entered By: Worthy Keeler on 02/16/2021 18:02:32 [name], [name] ([id number]) -------------------------------------------------------------------------------- SuperBill Details Patient Name: Nicolas Cousin L. Date of Service: 02/16/2021 Medical Record Number: [id number] Patient Account Number: [id number] Date of Birth/Sex: 1971/01/17 (50 y.o. Male) Treating RN: Carlene Coria Primary Care Provider: PATIENT, NO Other Clinician: Referring Provider: Barkley Boards Treating Provider/Extender: Skipper Cliche in Treatment: 5 Diagnosis Coding ICD-10 Codes Code Description I87.2 Venous insufficiency (chronic) (peripheral) L97.812 Non-pressure chronic ulcer of other part of right lower leg with fat layer exposed Facility Procedures CPT4 Code: [id number] Description: [place] VISIT-LEV 3 EST PT Modifier: Quantity: 1 Physician Procedures CPT4 Code: 5027741 Description: 28786 - WC PHYS LEVEL 4 - EST PT Modifier: Quantity: 1 CPT4 Code: Description: ICD-10 Diagnosis Description I87.2 Venous insufficiency (chronic) (peripheral) L97.812 Non-pressure chronic ulcer of other part of right lower leg with fat la Modifier: yer exposed Quantity: Electronic Signature(s) Signed: 02/16/2021 6:02:46 PM By: Worthy Keeler PA-C Previous Signature: 02/16/2021 4:44:14 PM Version By: Carlene Coria RN Entered By: Worthy Keeler on 02/16/2021 18:02:46

## 2021-02-16 NOTE — Progress Notes (Signed)
ARMANY, MANO ([id number]) Visit Report for 02/16/2021 Arrival Information Details Patient Name: [name], [name]. Date of Service: 02/16/2021 9:00 AM Medical Record Number: [id number] Patient Account Number: [id number] Date of Birth/Sex: 14-Dec-1970 (50 y.o. Male) Treating RN: Donnamarie Poag Primary Care Rogerick Baldwin: PATIENT, NO Other Clinician: Referring Keylen Eckenrode: Barkley Boards Treating Johnaton Sonneborn/Extender: Skipper Cliche in Treatment: 5 Visit Information History Since Last Visit Added or deleted any medications: No Patient Arrived: Ambulatory Had a fall or experienced change in No Arrival Time: 09:27 activities of daily living that may affect Accompanied By: self risk of falls: Transfer Assistance: None Hospitalized since last visit: No Patient Has Alerts: Yes Has Dressing in Place as Prescribed: Yes Patient Alerts: NOT DIABETIC Pain Present Now: No Electronic Signature(s) Signed: 02/16/2021 4:31:02 PM By: Donnamarie Poag Entered By: Donnamarie Poag on 02/16/2021 09:27:43 [place], [place] ([id number]) -------------------------------------------------------------------------------- Clinic Level of Care Assessment Details Patient Name: Nicolas Cousin L. Date of Service: 02/16/2021 9:00 AM Medical Record Number: [id number] Patient Account Number: [id number] Date of Birth/Sex: [birth year] (50 y.o. Male) Treating RN: Carlene Coria Primary Care Aniketh Huberty: PATIENT, NO Other Clinician: Referring Kijuana Ruppel: Barkley Boards Treating Janylah Belgrave/Extender: Skipper Cliche in Treatment: 5 Clinic Level of Care Assessment Items TOOL 4 Quantity Score X - Use when only an EandM is performed on FOLLOW-UP visit 1 0 ASSESSMENTS - Nursing Assessment / Reassessment X - Reassessment of Co-morbidities (includes updates in patient status) 1 10 X- 1 5 Reassessment of Adherence to Treatment Plan ASSESSMENTS - Wound and Skin Assessment / Reassessment X - Simple Wound Assessment / Reassessment - one wound 1 5 []  - 0 Complex  Wound Assessment / Reassessment - multiple wounds []  - 0 Dermatologic / Skin Assessment (not related to wound area) ASSESSMENTS - Focused Assessment []  - Circumferential Edema Measurements - multi extremities 0 []  - 0 Nutritional Assessment / Counseling / Intervention []  - 0 Lower Extremity Assessment (monofilament, tuning fork, pulses) []  - 0 Peripheral Arterial Disease Assessment (using hand held doppler) ASSESSMENTS - Ostomy and/or Continence Assessment and Care []  - Incontinence Assessment and Management 0 []  - 0 Ostomy Care Assessment and Management (repouching, etc.) PROCESS - Coordination of Care X - Simple Patient / Family Education for ongoing care 1 15 []  - 0 Complex (extensive) Patient / Family Education for ongoing care X- 1 10 Staff obtains Programmer, systems, Records, Test Results / Process Orders []  - 0 Staff telephones HHA, Nursing Homes / Clarify orders / etc []  - 0 Routine Transfer to another Facility (non-emergent condition) []  - 0 Routine Hospital Admission (non-emergent condition) []  - 0 New Admissions / Biomedical engineer / Ordering NPWT, Apligraf, etc. []  - 0 Emergency Hospital Admission (emergent condition) X- 1 10 Simple Discharge Coordination []  - 0 Complex (extensive) Discharge Coordination PROCESS - Special Needs []  - Pediatric / Minor Patient Management 0 []  - 0 Isolation Patient Management []  - 0 Hearing / Language / Visual special needs []  - 0 Assessment of Community assistance (transportation, D/C planning, etc.) []  - 0 Additional assistance / Altered mentation []  - 0 Support Surface(s) Assessment (bed, cushion, seat, etc.) INTERVENTIONS - Wound Cleansing / Measurement Swayze, Ottis L. ([id number]) X- 1 5 Simple Wound Cleansing - one wound []  - 0 Complex Wound Cleansing - multiple wounds X- 1 5 Wound Imaging (photographs - any number of wounds) []  - 0 Wound Tracing (instead of photographs) X- 1 5 Simple Wound Measurement - one  wound []  - 0 Complex Wound Measurement - multiple wounds INTERVENTIONS - Wound Dressings X -  Small Wound Dressing one or multiple wounds 1 10 []  - 0 Medium Wound Dressing one or multiple wounds []  - 0 Large Wound Dressing one or multiple wounds X- 1 5 Application of Medications - topical []  - 0 Application of Medications - injection INTERVENTIONS - Miscellaneous []  - External ear exam 0 []  - 0 Specimen Collection (cultures, biopsies, blood, body fluids, etc.) []  - 0 Specimen(s) / Culture(s) sent or taken to Lab for analysis []  - 0 Patient Transfer (multiple staff / Civil Service fast streamer / Similar devices) []  - 0 Simple Staple / Suture removal (25 or less) []  - 0 Complex Staple / Suture removal (26 or more) []  - 0 Hypo / Hyperglycemic Management (close monitor of Blood Glucose) []  - 0 Ankle / Brachial Index (ABI) - do not check if billed separately X- 1 5 Vital Signs Has the patient been seen at the hospital within the last three years: Yes Total Score: 90 Level Of Care: New/Established - Level 3 Electronic Signature(s) Signed: 02/16/2021 4:44:14 PM By: Carlene Coria RN Entered By: Carlene Coria on 02/16/2021 10:10:55 [name] ([id number]) -------------------------------------------------------------------------------- Encounter Discharge Information Details Patient Name: Nicolas Cousin L. Date of Service: 02/16/2021 9:00 AM Medical Record Number: [id number] Patient Account Number: [id number] Date of Birth/Sex: Apr 06, 1971 (50 y.o. Male) Treating RN: Donnamarie Poag Primary Care Syvilla Martin: PATIENT, NO Other Clinician: Referring Tashianna Broome: Barkley Boards Treating Sevin Farone/Extender: Skipper Cliche in Treatment: 5 Encounter Discharge Information Items Discharge Condition: Stable Ambulatory Status: Ambulatory Discharge Destination: Home Transportation: Private Auto Accompanied By: self Schedule Follow-up Appointment: Yes Clinical Summary of Care: Electronic Signature(s) Signed:  02/16/2021 4:31:02 PM By: Donnamarie Poag Entered By: Donnamarie Poag on 02/16/2021 10:27:07 [name] ([id number]) -------------------------------------------------------------------------------- Lower Extremity Assessment Details Patient Name: NIKODEM, LEADBETTER L. Date of Service: 02/16/2021 9:00 AM Medical Record Number: [id number] Patient Account Number: [id number] Date of Birth/Sex: Jan 03, 1971 (50 y.o. Male) Treating RN: Donnamarie Poag Primary Care Margarette Vannatter: PATIENT, NO Other Clinician: Referring Khelani Kops: Barkley Boards Treating Dickson Kostelnik/Extender: Skipper Cliche in Treatment: 5 Edema Assessment Assessed: [Left: No] [Right: Yes] Edema: [Left: N] [Right: o] Vascular Assessment Pulses: Dorsalis Pedis Palpable: [Right:Yes] Electronic Signature(s) Signed: 02/16/2021 4:31:02 PM By: Donnamarie Poag Entered By: Donnamarie Poag on 02/16/2021 09:37:29 [place]on, [place] ([id number]) -------------------------------------------------------------------------------- Multi Wound Chart Details Patient Name: Nicolas Cousin L. Date of Service: 02/16/2021 9:00 AM Medical Record Number: [id number] Patient Account Number: [id number] Date of Birth/Sex: May 04, 1971 (50 y.o. Male) Treating RN: Carlene Coria Primary Care Klye Besecker: PATIENT, NO Other Clinician: Referring Makyah Lavigne: Barkley Boards Treating Abigial Newville/Extender: Skipper Cliche in Treatment: 5 Vital Signs Height(in): 78 Pulse(bpm): 60 Weight(lbs): 249 Blood Pressure(mmHg): 133/79 Body Mass Index(BMI): 29 Temperature(F): 98.4 Respiratory Rate(breaths/min): 16 Photos: [N/A:N/A] Wound Location: Right, Medial Lower Leg N/A N/A Wounding Event: Gradually Appeared N/A N/A Primary Etiology: Venous Leg Ulcer N/A N/A Date Acquired: 11/24/2020 N/A N/A Weeks of Treatment: 5 N/A N/A Wound Status: Open N/A N/A Measurements L x W x D (cm) 0.8x0.8x0.2 N/A N/A Area (cm) : 0.503 N/A N/A Volume (cm) : 0.101 N/A N/A % Reduction in Area: 40.70% N/A N/A % Reduction in  Volume: 40.60% N/A N/A Classification: Full Thickness Without Exposed N/A N/A Support Structures Exudate Amount: Medium N/A N/A Exudate Type: Serous N/A N/A Exudate Color: amber N/A N/A Wound Margin: Flat and Intact N/A N/A Granulation Amount: Medium (34-66%) N/A N/A Granulation Quality: Red, Pink N/A N/A Necrotic Amount: Medium (34-66%) N/A N/A Exposed Structures: Fat Layer (Subcutaneous Tissue): N/A N/A Yes Fascia: No Tendon: No Muscle: No Joint:  No Bone: No Epithelialization: Small (1-33%) N/A N/A Treatment Notes Electronic Signature(s) Signed: 02/16/2021 4:44:14 PM By: Carlene Coria RN Entered By: Carlene Coria on 02/16/2021 10:01:57 [name] ([id number]) -------------------------------------------------------------------------------- [place] Details Patient Name: EDSON, DERIDDER L. Date of Service: 02/16/2021 9:00 AM Medical Record Number: [id number] Patient Account Number: [id number] Date of Birth/Sex: 08-Aug-1971 (50 y.o. Male) Treating RN: Carlene Coria Primary Care Leata Dominy: PATIENT, NO Other Clinician: Referring Sharanda Shinault: Barkley Boards Treating Barbette Mcglaun/Extender: Skipper Cliche in Treatment: 5 Active Inactive Wound/Skin Impairment Nursing Diagnoses: Knowledge deficit related to ulceration/compromised skin integrity Goals: Patient/caregiver will verbalize understanding of skin care regimen Date Initiated: 01/12/2021 Target Resolution Date: 02/12/2021 Goal Status: Active Ulcer/skin breakdown will have a volume reduction of 30% by week 4 Date Initiated: 01/12/2021 Target Resolution Date: 02/12/2021 Goal Status: Active Ulcer/skin breakdown will have a volume reduction of 50% by week 8 Date Initiated: 01/12/2021 Target Resolution Date: 03/14/2021 Goal Status: Active Ulcer/skin breakdown will have a volume reduction of 80% by week 12 Date Initiated: 01/12/2021 Target Resolution Date: 04/14/2021 Goal Status: Active Ulcer/skin breakdown will heal  within 14 weeks Date Initiated: 01/12/2021 Target Resolution Date: 05/15/2021 Goal Status: Active Interventions: Assess patient/caregiver ability to obtain necessary supplies Assess patient/caregiver ability to perform ulcer/skin care regimen upon admission and as needed Assess ulceration(s) every visit Notes: Electronic Signature(s) Signed: 02/16/2021 4:44:14 PM By: Carlene Coria RN Entered By: Carlene Coria on 02/16/2021 10:01:45 [place], [place] ([id number]) -------------------------------------------------------------------------------- Pain Assessment Details Patient Name: Nicolas Cousin L. Date of Service: 02/16/2021 9:00 AM Medical Record Number: [id number] Patient Account Number: [id number] Date of Birth/Sex: Dec 22, 1970 (50 y.o. Male) Treating RN: Donnamarie Poag Primary Care Ledell Codrington: PATIENT, NO Other Clinician: Referring Muriel Hannold: Barkley Boards Treating Fawzi Melman/Extender: Skipper Cliche in Treatment: 5 Active Problems Location of Pain Severity and Description of Pain Patient Has Paino No Site Locations Rate the pain. Current Pain Level: 0 Pain Management and Medication Current Pain Management: Electronic Signature(s) Signed: 02/16/2021 4:31:02 PM By: Donnamarie Poag Entered By: Donnamarie Poag on 02/16/2021 09:28:13 [name] ([id number]) -------------------------------------------------------------------------------- Patient/Caregiver Education Details Patient Name: Nicolas Cousin L. Date of Service: 02/16/2021 9:00 AM Medical Record Number: [id number] Patient Account Number: [id number] Date of Birth/Gender: June 11, 1971 (50 y.o. Male) Treating RN: Carlene Coria Primary Care Physician: PATIENT, NO Other Clinician: Referring Physician: Barkley Boards Treating Physician/Extender: Skipper Cliche in Treatment: 5 Education Assessment Education Provided To: Patient Education Topics Provided Wound/Skin Impairment: Methods: Explain/Verbal Responses: State content  correctly Electronic Signature(s) Signed: 02/16/2021 4:44:14 PM By: Carlene Coria RN Entered By: Carlene Coria on 02/16/2021 10:11:56 [place], [place] ([id number]) -------------------------------------------------------------------------------- Wound Assessment Details Patient Name: Nicolas Cousin L. Date of Service: 02/16/2021 9:00 AM Medical Record Number: [id number] Patient Account Number: [id number] Date of Birth/Sex: [birth year] (50 y.o. Male) Treating RN: Donnamarie Poag Primary Care Saron Tweed: PATIENT, NO Other Clinician: Referring Cambrey Lupi: Barkley Boards Treating Andras Grunewald/Extender: Skipper Cliche in Treatment: 5 Wound Status Wound Number: 1 Primary Etiology: Venous Leg Ulcer Wound Location: Right, Medial Lower Leg Wound Status: Open Wounding Event: Gradually Appeared Date Acquired: 11/24/2020 Weeks Of Treatment: 5 Clustered Wound: No Photos Wound Measurements Length: (cm) 0.8 Width: (cm) 0.8 Depth: (cm) 0.2 Area: (cm) 0.503 Volume: (cm) 0.101 % Reduction in Area: 40.7% % Reduction in Volume: 40.6% Epithelialization: Small (1-33%) Tunneling: No Undermining: No Wound Description Classification: Full Thickness Without Exposed Support Structu Wound Margin: Flat and Intact Exudate Amount: Medium Exudate Type: Serous Exudate Color: amber res Foul Odor After Cleansing: No Slough/Fibrino Yes Wound Bed Granulation Amount: Medium (  34-66%) Exposed Structure Granulation Quality: Red, Pink Fascia Exposed: No Necrotic Amount: Medium (34-66%) Fat Layer (Subcutaneous Tissue) Exposed: Yes Necrotic Quality: Adherent Slough Tendon Exposed: No Muscle Exposed: No Joint Exposed: No Bone Exposed: No Treatment Notes Wound #1 (Lower Leg) Wound Laterality: Right, Medial Cleanser Soap and Water Discharge Instruction: Gently cleanse wound with antibacterial soap, rinse and pat dry prior to dressing wounds Peri-Wound Care JAKEN, FREGIA L. ([id number]) Triamcinolone Acetonide Cream, 0.1%,  15 (g) tube Discharge Instruction: apply to peri area and wound bed Topical Primary Dressing Hydrofera Blue Ready Transfer Foam, 4x5 (in/in) Discharge Instruction: Apply Hydrofera Blue Ready to wound bed as directed Secondary Dressing Bordered Gauze Sterile-HBD 4x4 (in/in) Discharge Instruction: Cover wound with Bordered Guaze Sterile as directed Secured With Compression Wrap Compression Stockings Add-Ons Electronic Signature(s) Signed: 02/16/2021 4:31:02 PM By: Donnamarie Poag Entered By: Donnamarie Poag on 02/16/2021 [place], [place] ([id number]) -------------------------------------------------------------------------------- [place] Details Patient Name: Nicolas Cousin L. Date of Service: 02/16/2021 9:00 AM Medical Record Number: [id number] Patient Account Number: [id number] Date of Birth/Sex: 1970/10/14 (50 y.o. Male) Treating RN: Donnamarie Poag Primary Care Romond Pipkins: PATIENT, NO Other Clinician: Referring Jamey Harman: Barkley Boards Treating Murle Hellstrom/Extender: Skipper Cliche in Treatment: 5 Vital Signs Time Taken: 09:25 Temperature (F): 98.4 Height (in): 78 Pulse (bpm): 60 Weight (lbs): 249 Respiratory Rate (breaths/min): 16 Body Mass Index (BMI): 28.8 Blood Pressure (mmHg): 133/79 Reference Range: 80 - 120 mg / dl Electronic Signature(s) Signed: 02/16/2021 4:31:02 PM By: Donnamarie Poag Entered ByDonnamarie Poag on 02/16/2021 09:28:05

## 2021-02-23 ENCOUNTER — Other Ambulatory Visit: Payer: Self-pay

## 2021-02-23 ENCOUNTER — Encounter: Payer: Self-pay | Attending: Physician Assistant | Admitting: Physician Assistant

## 2021-02-23 DIAGNOSIS — L97812 Non-pressure chronic ulcer of other part of right lower leg with fat layer exposed: Secondary | ICD-10-CM | POA: Insufficient documentation

## 2021-02-23 DIAGNOSIS — I872 Venous insufficiency (chronic) (peripheral): Secondary | ICD-10-CM | POA: Insufficient documentation

## 2021-02-23 DIAGNOSIS — L88 Pyoderma gangrenosum: Secondary | ICD-10-CM | POA: Insufficient documentation

## 2021-02-23 NOTE — Progress Notes (Addendum)
DAJON, LAZAR ([id number]) Visit Report for 02/23/2021 Chief Complaint Document Details Patient Name: IMANOL, BIHL. Date of Service: 02/23/2021 9:00 AM Medical Record Number: [id number] Patient Account Number: [id number] Date of Birth/Sex: 11/18/1970 (50 y.o. M) Treating RN: Carlene Coria Primary Care Provider: PATIENT, NO Other Clinician: Referring Provider: Barkley Boards Treating Provider/Extender: Skipper Cliche in Treatment: 6 Information Obtained from: Patient Chief Complaint Right LE Ulcer Electronic Signature(s) Signed: 02/23/2021 9:29:37 AM By: Worthy Keeler PA-C Entered By: Worthy Keeler on 02/23/2021 09:29:37 Sypher, Zackarey Carlean Jews ([id number]) -------------------------------------------------------------------------------- Debridement Details Patient Name: [name] Date of Service: 02/23/2021 9:00 AM Medical Record Number: [id number] Patient Account Number: [id number] Date of Birth/Sex: 1970/12/29 (50 y.o. M) Treating RN: Carlene Coria Primary Care Provider: PATIENT, NO Other Clinician: Referring Provider: Barkley Boards Treating Provider/Extender: Skipper Cliche in Treatment: 6 Debridement Performed for Wound #1 Right,Medial Lower Leg Assessment: Performed By: Physician Tommie Sams., PA-C Debridement Type: Debridement Severity of Tissue Pre Debridement: Fat layer exposed Level of Consciousness (Pre- Awake and Alert procedure): Pre-procedure Verification/Time Out Yes - 09:32 Taken: Start Time: 09:32 Pain Control: Lidocaine 4% Topical Solution Total Area Debrided (L x W): 0.5 (cm) x 0.4 (cm) = 0.2 (cm) Tissue and other material Viable, Non-Viable, Eschar, Slough, Subcutaneous, Slough debrided: Level: Skin/Subcutaneous Tissue Debridement Description: Excisional Instrument: Curette Bleeding: Minimum Hemostasis Achieved: Pressure End Time: 09:35 Procedural Pain: 3 Post Procedural Pain: 0 Response to Treatment: Procedure was tolerated well Level of  Consciousness (Post- Awake and Alert procedure): Post Debridement Measurements of Total Wound Length: (cm) 0.9 Width: (cm) 0.8 Depth: (cm) 0.2 Volume: (cm) 0.113 Character of Wound/Ulcer Post Debridement: Improved Severity of Tissue Post Debridement: Fat layer exposed Post Procedure Diagnosis Same as Pre-procedure Electronic Signature(s) Signed: 02/23/2021 4:09:56 PM By: Worthy Keeler PA-C Signed: 02/23/2021 6:29:31 PM By: Carlene Coria RN Entered By: Carlene Coria on 02/23/2021 09:36:26 Grussing, [place] ([id number]) -------------------------------------------------------------------------------- HPI Details Patient Name: [name] Date of Service: 02/23/2021 9:00 AM Medical Record Number: [id number] Patient Account Number: [id number] Date of Birth/Sex: 1971-06-23 (50 y.o. M) Treating RN: Carlene Coria Primary Care Provider: PATIENT, NO Other Clinician: Referring Provider: Barkley Boards Treating Provider/Extender: Skipper Cliche in Treatment: 6 History of Present Illness HPI Description: 01/12/2021 upon evaluation today patient appears to be doing somewhat poorly in regard to a wound on his right medial lower leg. Currently he tells me that this has been going on since around the beginning of April. He has done a telehealth visit with a physician who initially gave him a triamcinolone cream along with Keflex for 5 days. Subsequently he ended up being seen at walk-in urgent care where they also given Keflex for 7 days that seem to be doing better for him. Overall he tells me that things have improved from the standpoint of redness. Fortunately there does not appear to be any signs of systemic infection to be honest right now even locally I do not see any evidence of infection right now. He does have 1 main wound with some scattering areas that look like they may have been small ulcerations but again for the most part these have filled in. This does look almost to be more of a  vasculitis type scenario based on what I am seeing. Patient does have chronic venous insufficiency but is not wearing any compression even though it sounds like he has been told before he should. 5/27; patient with small wounds on his right medial ankle. He has chronic venous  insufficiency and stasis dermatitis a large open wound and several smaller areas all of which looks better than on presentation last week according to her nursing staff 01/26/2021 upon evaluation today patient appears to be doing well with regard to his wound. He is making good progress. With that being said there is some need currently for sharp debridement in regard to the wound there is some necrotic debris it is almost completely cleaned off but not completely. He is in agreement with that plan today. Fortunately there does not appear to be any evidence of active infection which is great and I am very pleased in that regard. He still wishes he did not have to wear the compression wrap. He did get his compression socks from elastic therapy though they are in the 15 to 20 mmHg range they did not have any his length as he is a tall guy in the 20 to 30 mmHg range. 02/02/2021 upon evaluation today patient appears to be doing excellent in regard to his leg ulcer. He has been tolerating the dressing changes without complication. Fortunately there is no evidence of active infection at this time. No fevers, chills, nausea, vomiting, or diarrhea. 02/09/2021 upon evaluation today patient's wound actually showing signs of improvement. Fortunately there does not appear to be any evidence of active infection which is great news overall very pleased with where things stand today. 02/16/2021 upon evaluation today patient's wound is showing some signs of improvement. He did have a fairly aggressive debridement last week he notes that he had quite a bit of discomfort he was some swelling following. With that being said I think this was to be  expected considering what we had to do from a debridement standpoint. The good news is there does not appear to be any evidence of infection currently nonetheless I do believe that the patient may have some signs of vasculitis here. Based on the overall appearance of the wound today and beginning to suspect this. For that reason I think he would benefit from starting him on triamcinolone ointment to the wound bed and some of the immediate periwound to try to help out in this regard. 02/23/2021 upon evaluation today patient appears to be doing well with regard to his wound. He is actually making some progress here. The wound is roughly about the same size but the overall appearance of the wound bed is much improved. I do not see any signs of active infection at this time which is great news. No fevers, chills, nausea, vomiting, or diarrhea. Electronic Signature(s) Signed: 02/23/2021 9:42:46 AM By: Worthy Keeler PA-C Entered By: Worthy Keeler on 02/23/2021 09:42:46 Moehring, Blease Carlean Jews ([id number]) -------------------------------------------------------------------------------- Physical Exam Details Patient Name: LONZA, SHIMABUKURO L. Date of Service: 02/23/2021 9:00 AM Medical Record Number: [id number] Patient Account Number: [id number] Date of Birth/Sex: [birth year] (50 y.o. M) Treating RN: Carlene Coria Primary Care Provider: PATIENT, NO Other Clinician: Referring Provider: Barkley Boards Treating Provider/Extender: Skipper Cliche in Treatment: 6 Constitutional Well-nourished and well-hydrated in no acute distress. Respiratory normal breathing without difficulty. Psychiatric this patient is able to make decisions and demonstrates good insight into disease process. Alert and Oriented x 3. pleasant and cooperative. Notes Patient's wound bed showed signs of good granulation epithelization at this point. There does not appear to be any evidence of infection currently which is great and overall extremely  pleased with where things stand today. No fevers, chills, nausea, vomiting, or diarrhea. Electronic Signature(s) Signed: 02/23/2021 9:43:27 AM By: Melburn Hake,  Ysidra Sopher PA-C Entered By: Worthy Keeler on 02/23/2021 09:43:27 Cletus Gash, Eisen Carlean Jews ([id number]) -------------------------------------------------------------------------------- Physician Orders Details Patient Name: CARMON, SAHLI L. Date of Service: 02/23/2021 9:00 AM Medical Record Number: [id number] Patient Account Number: [id number] Date of Birth/Sex: 09/18/1970 (50 y.o. M) Treating RN: Carlene Coria Primary Care Provider: PATIENT, NO Other Clinician: Referring Provider: Barkley Boards Treating Provider/Extender: Skipper Cliche in Treatment: 6 Verbal / Phone Orders: No Diagnosis Coding ICD-10 Coding Code Description I87.2 Venous insufficiency (chronic) (peripheral) L97.812 Non-pressure chronic ulcer of other part of right lower leg with fat layer exposed Follow-up Appointments o Return Appointment in 1 week. Bathing/ Shower/ Hygiene o May shower with wound dressing protected with water repellent cover or cast protector. Edema Control - Lymphedema / Segmental Compressive Device / Other o Patient to wear own compression stockings. Remove compression stockings every night before going to bed and put on every morning when getting up. - right and left lower leg o Elevate, Exercise Daily and Avoid Standing for Long Periods of Time. o Elevate legs to the level of the heart and pump ankles as often as possible o Elevate leg(s) parallel to the floor when sitting. Wound Treatment Wound #1 - Lower Leg Wound Laterality: Right, Medial Cleanser: Soap and Water 1 x Per Day/30 Days Discharge Instructions: Gently cleanse wound with antibacterial soap, rinse and pat dry prior to dressing wounds Peri-Wound Care: Triamcinolone Acetonide Cream, 0.1%, 15 (g) tube 1 x Per Day/30 Days Discharge Instructions: apply to peri area and wound  bed Primary Dressing: Hydrofera Blue Ready Transfer Foam, 4x5 (in/in) 1 x Per Day/30 Days Discharge Instructions: Apply Hydrofera Blue Ready to wound bed as directed Secondary Dressing: Bordered Gauze Sterile-HBD 4x4 (in/in) 1 x Per Day/30 Days Discharge Instructions: Cover wound with Bordered Guaze Sterile as directed Electronic Signature(s) Signed: 02/23/2021 4:09:56 PM By: Worthy Keeler PA-C Signed: 02/23/2021 6:29:31 PM By: Carlene Coria RN Entered By: Carlene Coria on 02/23/2021 09:36:46 Baltazar, [place] ([id number]) -------------------------------------------------------------------------------- Problem List Details Patient Name: HANNA, AULTMAN L. Date of Service: 02/23/2021 9:00 AM Medical Record Number: [id number] Patient Account Number: [id number] Date of Birth/Sex: 1970-09-26 (50 y.o. M) Treating RN: Carlene Coria Primary Care Provider: PATIENT, NO Other Clinician: Referring Provider: Barkley Boards Treating Provider/Extender: Skipper Cliche in Treatment: 6 Active Problems ICD-10 Encounter Code Description Active Date MDM Diagnosis I87.2 Venous insufficiency (chronic) (peripheral) 01/12/2021 No Yes L97.812 Non-pressure chronic ulcer of other part of right lower leg with fat layer 01/12/2021 No Yes exposed Inactive Problems Resolved Problems Electronic Signature(s) Signed: 02/23/2021 9:29:31 AM By: Worthy Keeler PA-C Entered By: Worthy Keeler on 02/23/2021 09:29:31 Heidt, Obert L. ([id number]) -------------------------------------------------------------------------------- Progress Note Details Patient Name: [name] Date of Service: 02/23/2021 9:00 AM Medical Record Number: [id number] Patient Account Number: [id number] Date of Birth/Sex: 1970/10/30 (50 y.o. M) Treating RN: Carlene Coria Primary Care Provider: PATIENT, NO Other Clinician: Referring Provider: Barkley Boards Treating Provider/Extender: Skipper Cliche in Treatment: 6 Subjective Chief  Complaint Information obtained from Patient Right LE Ulcer History of Present Illness (HPI) 01/12/2021 upon evaluation today patient appears to be doing somewhat poorly in regard to a wound on his right medial lower leg. Currently he tells me that this has been going on since around the beginning of April. He has done a telehealth visit with a physician who initially gave him a triamcinolone cream along with Keflex for 5 days. Subsequently he ended up being seen at walk-in urgent care where they also given Keflex for 7 days  that seem to be doing better for him. Overall he tells me that things have improved from the standpoint of redness. Fortunately there does not appear to be any signs of systemic infection to be honest right now even locally I do not see any evidence of infection right now. He does have 1 main wound with some scattering areas that look like they may have been small ulcerations but again for the most part these have filled in. This does look almost to be more of a vasculitis type scenario based on what I am seeing. Patient does have chronic venous insufficiency but is not wearing any compression even though it sounds like he has been told before he should. 5/27; patient with small wounds on his right medial ankle. He has chronic venous insufficiency and stasis dermatitis a large open wound and several smaller areas all of which looks better than on presentation last week according to her nursing staff 01/26/2021 upon evaluation today patient appears to be doing well with regard to his wound. He is making good progress. With that being said there is some need currently for sharp debridement in regard to the wound there is some necrotic debris it is almost completely cleaned off but not completely. He is in agreement with that plan today. Fortunately there does not appear to be any evidence of active infection which is great and I am very pleased in that regard. He still wishes he did  not have to wear the compression wrap. He did get his compression socks from elastic therapy though they are in the 15 to 20 mmHg range they did not have any his length as he is a tall guy in the 20 to 30 mmHg range. 02/02/2021 upon evaluation today patient appears to be doing excellent in regard to his leg ulcer. He has been tolerating the dressing changes without complication. Fortunately there is no evidence of active infection at this time. No fevers, chills, nausea, vomiting, or diarrhea. 02/09/2021 upon evaluation today patient's wound actually showing signs of improvement. Fortunately there does not appear to be any evidence of active infection which is great news overall very pleased with where things stand today. 02/16/2021 upon evaluation today patient's wound is showing some signs of improvement. He did have a fairly aggressive debridement last week he notes that he had quite a bit of discomfort he was some swelling following. With that being said I think this was to be expected considering what we had to do from a debridement standpoint. The good news is there does not appear to be any evidence of infection currently nonetheless I do believe that the patient may have some signs of vasculitis here. Based on the overall appearance of the wound today and beginning to suspect this. For that reason I think he would benefit from starting him on triamcinolone ointment to the wound bed and some of the immediate periwound to try to help out in this regard. 02/23/2021 upon evaluation today patient appears to be doing well with regard to his wound. He is actually making some progress here. The wound is roughly about the same size but the overall appearance of the wound bed is much improved. I do not see any signs of active infection at this time which is great news. No fevers, chills, nausea, vomiting, or diarrhea. Objective Constitutional Well-nourished and well-hydrated in no acute distress. Vitals  Time Taken: 9:23 AM, Height: 78 in, Weight: 249 lbs, BMI: 28.8, Temperature: 98 F, Pulse: 70 bpm, Respiratory  Rate: 18 breaths/min, Blood Pressure: 138/79 mmHg. Respiratory normal breathing without difficulty. Psychiatric this patient is able to make decisions and demonstrates good insight into disease process. Alert and Oriented x 3. pleasant and cooperative. MADARIAGA, Athony L. ([id number]) General Notes: Patient's wound bed showed signs of good granulation epithelization at this point. There does not appear to be any evidence of infection currently which is great and overall extremely pleased with where things stand today. No fevers, chills, nausea, vomiting, or diarrhea. Integumentary (Hair, Skin) Wound #1 status is Open. Original cause of wound was Gradually Appeared. The date acquired was: 11/24/2020. The wound has been in treatment 6 weeks. The wound is located on the Right,Medial Lower Leg. The wound measures 0.9cm length x 0.8cm width x 0.2cm depth; 0.565cm^2 area and 0.113cm^3 volume. There is Fat Layer (Subcutaneous Tissue) exposed. There is no tunneling or undermining noted. There is a medium amount of serous drainage noted. The wound margin is flat and intact. There is small (1-33%) red, pink granulation within the wound bed. There is a large (67-100%) amount of necrotic tissue within the wound bed including Adherent Slough. Assessment Active Problems ICD-10 Venous insufficiency (chronic) (peripheral) Non-pressure chronic ulcer of other part of right lower leg with fat layer exposed Procedures Wound #1 Pre-procedure diagnosis of Wound #1 is a Venous Leg Ulcer located on the Right,Medial Lower Leg .Severity of Tissue Pre Debridement is: Fat layer exposed. There was a Excisional Skin/Subcutaneous Tissue Debridement with a total area of 0.2 sq cm performed by Tommie Sams., PA-C. With the following instrument(s): Curette to remove Viable and Non-Viable tissue/material. Material removed  includes Eschar, Subcutaneous Tissue, and Slough after achieving pain control using Lidocaine 4% Topical Solution. No specimens were taken. A time out was conducted at 09:32, prior to the start of the procedure. A Minimum amount of bleeding was controlled with Pressure. The procedure was tolerated well with a pain level of 3 throughout and a pain level of 0 following the procedure. Post Debridement Measurements: 0.9cm length x 0.8cm width x 0.2cm depth; 0.113cm^3 volume. Character of Wound/Ulcer Post Debridement is improved. Severity of Tissue Post Debridement is: Fat layer exposed. Post procedure Diagnosis Wound #1: Same as Pre-Procedure Plan Follow-up Appointments: Return Appointment in 1 week. Bathing/ Shower/ Hygiene: May shower with wound dressing protected with water repellent cover or cast protector. Edema Control - Lymphedema / Segmental Compressive Device / Other: Patient to wear own compression stockings. Remove compression stockings every night before going to bed and put on every morning when getting up. - right and left lower leg Elevate, Exercise Daily and Avoid Standing for Long Periods of Time. Elevate legs to the level of the heart and pump ankles as often as possible Elevate leg(s) parallel to the floor when sitting. WOUND #1: - Lower Leg Wound Laterality: Right, Medial Cleanser: Soap and Water 1 x Per Day/30 Days Discharge Instructions: Gently cleanse wound with antibacterial soap, rinse and pat dry prior to dressing wounds Peri-Wound Care: Triamcinolone Acetonide Cream, 0.1%, 15 (g) tube 1 x Per Day/30 Days Discharge Instructions: apply to peri area and wound bed Primary Dressing: Hydrofera Blue Ready Transfer Foam, 4x5 (in/in) 1 x Per Day/30 Days Discharge Instructions: Apply Hydrofera Blue Ready to wound bed as directed Secondary Dressing: Bordered Gauze Sterile-HBD 4x4 (in/in) 1 x Per Day/30 Days Discharge Instructions: Cover wound with Bordered Guaze Sterile as  directed 1. I would recommend that we going continue with the wound care measures as before and the patient is in  agreement with plan. We will use a little bit of triamcinolone to the surface of the wound followed by [facility]. I think that this has done excellent for him up to this point. 2. I am also can recommend currently that we have the patient go ahead and continue as well with changing this daily. I think that that is a good way to go and obviously he does need to be sure to continue to use his compression socks which I think is also doing a good job for him. ECKSTEIN, Cliffton L. ([id number]) We will see patient back for reevaluation in 1 week here in the clinic. If anything worsens or changes patient will contact our office for additional recommendations. Electronic Signature(s) Signed: 02/23/2021 10:21:03 AM By: Worthy Keeler PA-C Entered By: Worthy Keeler on 02/23/2021 10:21:02 [name] ([id number]) -------------------------------------------------------------------------------- SuperBill Details Patient Name: [name] Date of Service: 02/23/2021 Medical Record Number: [id number] Patient Account Number: [id number] Date of Birth/Sex: 1971/04/17 (50 y.o. M) Treating RN: Carlene Coria Primary Care Provider: PATIENT, NO Other Clinician: Referring Provider: Barkley Boards Treating Provider/Extender: Skipper Cliche in Treatment: 6 Diagnosis Coding ICD-10 Codes Code Description I87.2 Venous insufficiency (chronic) (peripheral) L97.812 Non-pressure chronic ulcer of other part of right lower leg with fat layer exposed Facility Procedures CPT4 Code: 01100349 Description: 61164 - DEB SUBQ TISSUE 20 SQ CM/< Modifier: Quantity: 1 CPT4 Code: Description: ICD-10 Diagnosis Description H53.912 Non-pressure chronic ulcer of other part of right lower leg with fat lay Modifier: er exposed Quantity: Physician Procedures CPT4 Code: 2583462 Description: [place] - WC PHYS SUBQ  TISS 20 SQ CM Modifier: Quantity: 1 CPT4 Code: Description: ICD-10 Diagnosis Description T94.712 Non-pressure chronic ulcer of other part of right lower leg with fat lay Modifier: er exposed Quantity: Electronic Signature(s) Signed: 02/23/2021 10:21:14 AM By: Worthy Keeler PA-C Entered By: Worthy Keeler on 02/23/2021 10:21:13

## 2021-02-24 NOTE — Progress Notes (Signed)
ZURIEL, ROSKOS ([id number]) Visit Report for 02/23/2021 Arrival Information Details Patient Name: RUDY, LUHMANN. Date of Service: 02/23/2021 9:00 AM Medical Record Number: [id number] Patient Account Number: [id number] Date of Birth/Sex: 09/02/1970 (50 y.o. M) Treating RN: Carlene Coria Primary Care Luma Clopper: PATIENT, NO Other Clinician: Referring Ahlaya Ende: Barkley Boards Treating Adaley Kiene/Extender: Skipper Cliche in Treatment: 6 Visit Information History Since Last Visit All ordered tests and consults were completed: No Patient Arrived: Ambulatory Added or deleted any medications: No Arrival Time: 09:19 Any new allergies or adverse reactions: No Accompanied By: self Had a fall or experienced change in No Transfer Assistance: None activities of daily living that may affect Patient Identification Verified: Yes risk of falls: Secondary Verification Process Completed: Yes Signs or symptoms of abuse/neglect since last visito No Patient Requires Transmission-Based Precautions: No Hospitalized since last visit: No Patient Has Alerts: Yes Implantable device outside of the clinic excluding No Patient Alerts: NOT DIABETIC cellular tissue based products placed in the center since last visit: Has Dressing in Place as Prescribed: Yes Pain Present Now: No Electronic Signature(s) Signed: 02/23/2021 6:29:31 PM By: Carlene Coria RN Entered By: Carlene Coria on 02/23/2021 09:23:31 Tith, Ocean ([id number]) -------------------------------------------------------------------------------- Clinic Level of Care Assessment Details Patient Name: [name] Date of Service: 02/23/2021 9:00 AM Medical Record Number: [id number] Patient Account Number: [id number] Date of Birth/Sex: 03-20-1971 (50 y.o. M) Treating RN: Carlene Coria Primary Care Demichael Traum: PATIENT, NO Other Clinician: Referring Makaley Storts: Barkley Boards Treating Wanette Robison/Extender: Skipper Cliche in Treatment: 6 Clinic Level of Care  Assessment Items TOOL 1 Quantity Score _0  - Use when EandM and Procedure is performed on INITIAL visit 0 ASSESSMENTS - Nursing Assessment / Reassessment _1  - General Physical Exam (combine w/ comprehensive assessment (listed just below) when performed on new 0 pt. evals) _2  - 0 Comprehensive Assessment (HX, ROS, Risk Assessments, Wounds Hx, etc.) ASSESSMENTS - Wound and Skin Assessment / Reassessment _3  - Dermatologic / Skin Assessment (not related to wound area) 0 ASSESSMENTS - Ostomy and/or Continence Assessment and Care _4  - Incontinence Assessment and Management 0 _5  - 0 Ostomy Care Assessment and Management (repouching, etc.) PROCESS - Coordination of Care _6  - Simple Patient / Family Education for ongoing care 0 _7  - 0 Complex (extensive) Patient / Family Education for ongoing care _8  - 0 Staff obtains Programmer, systems, Records, Test Results / Process Orders _9  - 0 Staff telephones HHA, Nursing Homes / Clarify orders / etc _10  - 0 Routine Transfer to another Facility (non-emergent condition) _11  - 0 Routine Hospital Admission (non-emergent condition) _12  - 0 New Admissions / Biomedical engineer / Ordering NPWT, Apligraf, etc. _13  - 0 Emergency Hospital Admission (emergent condition) PROCESS - Special Needs _14  - Pediatric / Minor Patient Management 0 _15  - 0 Isolation Patient Management _16  - 0 Hearing / Language / Visual special needs _17  - 0 Assessment of Community assistance (transportation, D/C planning, etc.) _18  - 0 Additional assistance / Altered mentation _19  - 0 Support Surface(s) Assessment (bed, cushion, seat, etc.) INTERVENTIONS - Miscellaneous _20  - External ear exam 0 _21  - 0 Patient Transfer (multiple staff / Civil Service fast streamer / Similar devices) _22  - 0 Simple Staple / Suture removal (25 or less) _23  - 0 Complex Staple / Suture removal (26 or more) _24  - 0 Hypo/Hyperglycemic Management (do not check if billed separately) _25  - 0 Ankle / Brachial Index (ABI) - do not  check if billed separately Has the patient been seen at the hospital within the last three years: Yes  Total Score: 0 Level Of Care: ____ [name] ([id number]) Electronic Signature(s) Signed: 02/23/2021 6:29:31 PM By: Carlene Coria RN Entered By: Carlene Coria on 02/23/2021 09:36:54 Ellenberger, Spike Carlean Jews ([id number]) -------------------------------------------------------------------------------- Encounter Discharge Information Details Patient Name: MAJOUR, FREI L. Date of Service: 02/23/2021 9:00 AM Medical Record Number: [id number] Patient Account Number: [id number] Date of Birth/Sex: 30-Nov-1970 (50 y.o. M) Treating RN: Carlene Coria Primary Care Makinsey Pepitone: PATIENT, NO Other Clinician: Referring Tashya Alberty: Barkley Boards Treating Vasil Juhasz/Extender: Skipper Cliche in Treatment: 6 Encounter Discharge Information Items Post Procedure Vitals Discharge Condition: Stable Temperature (F): 98 Ambulatory Status: Ambulatory Pulse (bpm): 70 Discharge Destination: Home Respiratory Rate (breaths/min): 18 Transportation: Private Auto Blood Pressure (mmHg): 138/79 Accompanied By: self Schedule Follow-up Appointment: Yes Clinical Summary of Care: Patient Declined Electronic Signature(s) Signed: 02/23/2021 6:29:31 PM By: Carlene Coria RN Entered By: Carlene Coria on 02/23/2021 09:50:04 Betley, Bertil L. ([id number]) -------------------------------------------------------------------------------- Lower Extremity Assessment Details Patient Name: [name] Date of Service: 02/23/2021 9:00 AM Medical Record Number: [id number] Patient Account Number: [id number] Date of Birth/Sex: [birth year] (50 y.o. M) Treating RN: Carlene Coria Primary Care Jarold Macomber: PATIENT, NO Other Clinician: Referring Na Waldrip: Barkley Boards Treating Novice Vrba/Extender: Skipper Cliche in Treatment: 6 Vascular Assessment Pulses: Dorsalis Pedis Palpable: [Right:Yes] Electronic Signature(s) Signed: 02/23/2021 6:29:31 PM By: Carlene Coria RN Entered By: Carlene Coria on 02/23/2021 27:06:23 [place], Vivian LMarland Kitchen ([id number]) -------------------------------------------------------------------------------- Multi Wound Chart Details Patient Name: [name] Date of Service: 02/23/2021 9:00 AM Medical Record Number: [id number] Patient Account Number: [id number] Date of Birth/Sex: Oct 21, 1970 (50 y.o. M) Treating RN: Carlene Coria Primary Care Leandro Berkowitz: PATIENT, NO Other Clinician: Referring Shaquaya Wuellner: Barkley Boards Treating Verbie Babic/Extender: Skipper Cliche in Treatment: 6 Vital Signs Height(in): 78 Pulse(bpm): 70 Weight(lbs): 249 Blood Pressure(mmHg): 138/79 Body Mass Index(BMI): 29 Temperature(F): 98 Respiratory Rate(breaths/min): 18 Photos: [N/A:N/A] Wound Location: Right, Medial Lower Leg N/A N/A Wounding Event: Gradually Appeared N/A N/A Primary Etiology: Venous Leg Ulcer N/A N/A Date Acquired: 11/24/2020 N/A N/A Weeks of Treatment: 6 N/A N/A Wound Status: Open N/A N/A Measurements L x W x D (cm) 0.9x0.8x0.2 N/A N/A Area (cm) : 0.565 N/A N/A Volume (cm) : 0.113 N/A N/A % Reduction in Area: 33.40% N/A N/A % Reduction in Volume: 33.50% N/A N/A Classification: Full Thickness Without Exposed N/A N/A Support Structures Exudate Amount: Medium N/A N/A Exudate Type: Serous N/A N/A Exudate Color: amber N/A N/A Wound Margin: Flat and Intact N/A N/A Granulation Amount: Small (1-33%) N/A N/A Granulation Quality: Red, Pink N/A N/A Necrotic Amount: Large (67-100%) N/A N/A Exposed Structures: Fat Layer (Subcutaneous Tissue): N/A N/A Yes Fascia: No Tendon: No Muscle: No Joint: No Bone: No Epithelialization: Small (1-33%) N/A N/A Treatment Notes Electronic Signature(s) Signed: 02/23/2021 6:29:31 PM By: Carlene Coria RN Entered By: Carlene Coria on 02/23/2021 [place], [place]. ([id number]) -------------------------------------------------------------------------------- [place]  Details Patient Name: [name] Date of Service: 02/23/2021 9:00 AM Medical Record Number: [id number] Patient Account Number: [id number] Date of Birth/Sex: 02-22-1971 (50 y.o. M) Treating RN: Carlene Coria Primary Care Eugenie Harewood: PATIENT, NO Other Clinician: Referring Jasmen Emrich: Barkley Boards Treating Trejuan Matherne/Extender: Skipper Cliche in Treatment: 6 Active Inactive Wound/Skin Impairment Nursing Diagnoses: Knowledge deficit related to ulceration/compromised skin integrity Goals: Patient/caregiver will verbalize understanding of skin care regimen Date Initiated: 01/12/2021 Date Inactivated: 02/23/2021 Target Resolution Date: 02/12/2021 Goal Status: Met Ulcer/skin breakdown will have a volume reduction of 30% by week 4 Date Initiated: 01/12/2021 Date Inactivated: 02/23/2021 Target Resolution Date: 02/12/2021 Goal Status: Met Ulcer/skin breakdown will have  a volume reduction of 50% by week 8 Date Initiated: 01/12/2021 Target Resolution Date: 03/14/2021 Goal Status: Active Ulcer/skin breakdown will have a volume reduction of 80% by week 12 Date Initiated: 01/12/2021 Target Resolution Date: 04/14/2021 Goal Status: Active Ulcer/skin breakdown will heal within 14 weeks Date Initiated: 01/12/2021 Target Resolution Date: 05/15/2021 Goal Status: Active Interventions: Assess patient/caregiver ability to obtain necessary supplies Assess patient/caregiver ability to perform ulcer/skin care regimen upon admission and as needed Assess ulceration(s) every visit Notes: Electronic Signature(s) Signed: 02/23/2021 6:29:31 PM By: Carlene Coria RN Entered By: Carlene Coria on 02/23/2021 09:31:20 [place], Naim L. ([id number]) -------------------------------------------------------------------------------- Pain Assessment Details Patient Name: [name] Date of Service: 02/23/2021 9:00 AM Medical Record Number: [id number] Patient Account Number: [id number] Date of Birth/Sex: Oct 10, 1970 (50 y.o.  M) Treating RN: Carlene Coria Primary Care Dione Petron: PATIENT, NO Other Clinician: Referring Takeila Thayne: Barkley Boards Treating [place] Silveria/Extender: Skipper Cliche in Treatment: 6 Active Problems Location of Pain Severity and Description of Pain Patient Has Paino No Site Locations Pain Management and Medication Current Pain Management: Electronic Signature(s) Signed: 02/23/2021 6:29:31 PM By: Carlene Coria RN Entered By: Carlene Coria on 02/23/2021 09:24:13 [name] ([id number]) -------------------------------------------------------------------------------- Patient/Caregiver Education Details Patient Name: [name] Date of Service: 02/23/2021 9:00 AM Medical Record Number: [id number] Patient Account Number: [id number] Date of Birth/Gender: 1971/07/27 (50 y.o. M) Treating RN: Carlene Coria Primary Care Physician: PATIENT, NO Other Clinician: Referring Physician: Barkley Boards Treating Physician/Extender: Skipper Cliche in Treatment: 6 Education Assessment Education Provided To: Patient Education Topics Provided Wound/Skin Impairment: Methods: Explain/Verbal Responses: State content correctly Electronic Signature(s) Signed: 02/23/2021 6:29:31 PM By: Carlene Coria RN Entered By: Carlene Coria on 02/23/2021 09:37:07 Goetzke, Niederwald ([id number]) -------------------------------------------------------------------------------- Wound Assessment Details Patient Name: [name] Date of Service: 02/23/2021 9:00 AM Medical Record Number: [id number] Patient Account Number: [id number] Date of Birth/Sex: 02/08/1971 (50 y.o. M) Treating RN: Carlene Coria Primary Care Carnisha Feltz: PATIENT, NO Other Clinician: Referring Akim Watkinson: Barkley Boards Treating Ella Guillotte/Extender: Skipper Cliche in Treatment: 6 Wound Status Wound Number: 1 Primary Etiology: Venous Leg Ulcer Wound Location: Right, Medial Lower Leg Wound Status: Open Wounding Event: Gradually Appeared Date Acquired:  11/24/2020 Weeks Of Treatment: 6 Clustered Wound: No Photos Wound Measurements Length: (cm) 0.9 Width: (cm) 0.8 Depth: (cm) 0.2 Area: (cm) 0.565 Volume: (cm) 0.113 % Reduction in Area: 33.4% % Reduction in Volume: 33.5% Epithelialization: Small (1-33%) Tunneling: No Undermining: No Wound Description Classification: Full Thickness Without Exposed Support Structu Wound Margin: Flat and Intact Exudate Amount: Medium Exudate Type: Serous Exudate Color: amber res Foul Odor After Cleansing: No Slough/Fibrino Yes Wound Bed Granulation Amount: Small (1-33%) Exposed Structure Granulation Quality: Red, Pink Fascia Exposed: No Necrotic Amount: Large (67-100%) Fat Layer (Subcutaneous Tissue) Exposed: Yes Necrotic Quality: Adherent Slough Tendon Exposed: No Muscle Exposed: No Joint Exposed: No Bone Exposed: No Treatment Notes Wound #1 (Lower Leg) Wound Laterality: Right, Medial Cleanser Soap and Water Discharge Instruction: Gently cleanse wound with antibacterial soap, rinse and pat dry prior to dressing wounds Peri-Wound Care JEDIAH, HORGER L. ([id number]) Triamcinolone Acetonide Cream, 0.1%, 15 (g) tube Discharge Instruction: apply to peri area and wound bed Topical Primary Dressing Hydrofera Blue Ready Transfer Foam, 4x5 (in/in) Discharge Instruction: Apply Hydrofera Blue Ready to wound bed as directed Secondary Dressing Bordered Gauze Sterile-HBD 4x4 (in/in) Discharge Instruction: Cover wound with Bordered Guaze Sterile as directed Secured With Compression Wrap Compression Stockings Add-Ons Electronic Signature(s) Signed: 02/23/2021 6:29:31 PM By: Carlene Coria RN Entered By: Carlene Coria on 02/23/2021  [place], [place] ([id number]) -------------------------------------------------------------------------------- Vitals Details Patient Name: ATSUSHI, YOM. Date of Service: 02/23/2021 9:00 AM Medical Record Number: [id number] Patient Account Number:  [id number] Date of Birth/Sex: 03-05-1971 (50 y.o. M) Treating RN: Carlene Coria Primary Care Jeneva Schweizer: PATIENT, NO Other Clinician: Referring Ehan Freas: Barkley Boards Treating Dainelle Hun/Extender: Skipper Cliche in Treatment: 6 Vital Signs Time Taken: 09:23 Temperature (F): 98 Height (in): 78 Pulse (bpm): 70 Weight (lbs): 249 Respiratory Rate (breaths/min): 18 Body Mass Index (BMI): 28.8 Blood Pressure (mmHg): 138/79 Reference Range: 80 - 120 mg / dl Electronic Signature(s) Signed: 02/23/2021 6:29:31 PM By: Carlene Coria RN Entered By: Carlene Coria on 02/23/2021 09:24:07

## 2021-03-02 ENCOUNTER — Encounter: Payer: Self-pay | Admitting: Physician Assistant

## 2021-03-02 ENCOUNTER — Other Ambulatory Visit: Payer: Self-pay

## 2021-03-02 NOTE — Progress Notes (Addendum)
[name], Nicolas ([id number]) Visit Report for 03/02/2021 Chief Complaint Document Details Patient Name: [name], [name]. Date of Service: 03/02/2021 9:00 AM Medical Record Number: [id number] Patient Account Number: [id number] Date of Birth/Sex: [birth year] (50 y.o. M) Treating RN: Carlene Coria Primary Care Provider: PATIENT, NO Other Clinician: Referring Provider: Barkley Boards Treating Provider/Extender: Skipper Cliche in Treatment: 7 Information Obtained from: Patient Chief Complaint Right LE Ulcer Electronic Signature(s) Signed: 03/02/2021 9:27:55 AM By: Worthy Keeler PA-C Entered By: Worthy Keeler on 03/02/2021 09:27:55 Bouton, Oluwadarasimi L. ([id number]) -------------------------------------------------------------------------------- HPI Details Patient Name: Nicolas Cousin L. Date of Service: 03/02/2021 9:00 AM Medical Record Number: [id number] Patient Account Number: [id number] Date of Birth/Sex: [birth year] (50 y.o. M) Treating RN: Carlene Coria Primary Care Provider: PATIENT, NO Other Clinician: Referring Provider: Barkley Boards Treating Provider/Extender: Skipper Cliche in Treatment: 7 History of Present Illness HPI Description: 01/12/2021 upon evaluation today patient appears to be doing somewhat poorly in regard to a wound on his right medial lower leg. Currently he tells me that this has been going on since around the beginning of April. He has done a telehealth visit with a physician who initially gave him a triamcinolone cream along with Keflex for 5 days. Subsequently he ended up being seen at walk-in urgent care where they also given Keflex for 7 days that seem to be doing better for him. Overall he tells me that things have improved from the standpoint of redness. Fortunately there does not appear to be any signs of systemic infection to be honest right now even locally I do not see any evidence of infection right now. He does have 1 main wound with some scattering areas that look  like they may have been small ulcerations but again for the most part these have filled in. This does look almost to be more of a vasculitis type scenario based on what I am seeing. Patient does have chronic venous insufficiency but is not wearing any compression even though it sounds like he has been told before he should. 5/27; patient with small wounds on his right medial ankle. He has chronic venous insufficiency and stasis dermatitis a large open wound and several smaller areas all of which looks better than on presentation last week according to her nursing staff 01/26/2021 upon evaluation today patient appears to be doing well with regard to his wound. He is making good progress. With that being said there is some need currently for sharp debridement in regard to the wound there is some necrotic debris it is almost completely cleaned off but not completely. He is in agreement with that plan today. Fortunately there does not appear to be any evidence of active infection which is great and I am very pleased in that regard. He still wishes he did not have to wear the compression wrap. He did get his compression socks from elastic therapy though they are in the 15 to 20 mmHg range they did not have any his length as he is a tall guy in the 20 to 30 mmHg range. 02/02/2021 upon evaluation today patient appears to be doing excellent in regard to his leg ulcer. He has been tolerating the dressing changes without complication. Fortunately there is no evidence of active infection at this time. No fevers, chills, nausea, vomiting, or diarrhea. 02/09/2021 upon evaluation today patient's wound actually showing signs of improvement. Fortunately there does not appear to be any evidence of active infection which is great news overall very pleased with where  things stand today. 02/16/2021 upon evaluation today patient's wound is showing some signs of improvement. He did have a fairly aggressive debridement last  week he notes that he had quite a bit of discomfort he was some swelling following. With that being said I think this was to be expected considering what we had to do from a debridement standpoint. The good news is there does not appear to be any evidence of infection currently nonetheless I do believe that the patient may have some signs of vasculitis here. Based on the overall appearance of the wound today and beginning to suspect this. For that reason I think he would benefit from starting him on triamcinolone ointment to the wound bed and some of the immediate periwound to try to help out in this regard. 02/23/2021 upon evaluation today patient appears to be doing well with regard to his wound. He is actually making some progress here. The wound is roughly about the same size but the overall appearance of the wound bed is much improved. I do not see any signs of active infection at this time which is great news. No fevers, chills, nausea, vomiting, or diarrhea. 03/02/2021 upon evaluation today patient appears to be doing well with regard to his wound. I think the biggest issue I see right now is that with Hydrofera Blue this wound is staying somewhat dry. I think he could benefit from the use of collagen to try to help with getting this to stimulate some additional growth. He is in agreement with the plan. I think this also had a little bit more moisture which would be helpful at this point. Electronic Signature(s) Signed: 03/02/2021 9:51:37 AM By: Worthy Keeler PA-C Entered By: Worthy Keeler on 03/02/2021 09:51:37 Leano, Dailyn Carlean Jews ([id number]) -------------------------------------------------------------------------------- Physical Exam Details Patient Name: [name], [name] Date of Service: 03/02/2021 9:00 AM Medical Record Number: [id number] Patient Account Number: [id number] Date of Birth/Sex: 1970/09/18 (50 y.o. M) Treating RN: Carlene Coria Primary Care Provider: PATIENT, NO Other  Clinician: Referring Provider: Barkley Boards Treating Provider/Extender: Skipper Cliche in Treatment: 7 Constitutional Well-nourished and well-hydrated in no acute distress. Respiratory normal breathing without difficulty. Psychiatric this patient is able to make decisions and demonstrates good insight into disease process. Alert and Oriented x 3. pleasant and cooperative. Notes Patient's wound bed did not require sharp debridement today. I believe that he is doing very well. I am pleased with the overall appearance currently. I do not see any evidence of infection which is great news as well. Electronic Signature(s) Signed: 03/02/2021 9:52:11 AM By: Worthy Keeler PA-C Entered By: Worthy Keeler on 03/02/2021 09:52:11 [name] ([id number]) -------------------------------------------------------------------------------- Physician Orders Details Patient Name: SHOOTER, TANGEN L. Date of Service: 03/02/2021 9:00 AM Medical Record Number: [id number] Patient Account Number: [id number] Date of Birth/Sex: 1971/06/20 (50 y.o. M) Treating RN: Carlene Coria Primary Care Provider: PATIENT, NO Other Clinician: Referring Provider: Barkley Boards Treating Provider/Extender: Skipper Cliche in Treatment: 7 Verbal / Phone Orders: No Diagnosis Coding ICD-10 Coding Code Description I87.2 Venous insufficiency (chronic) (peripheral) L97.812 Non-pressure chronic ulcer of other part of right lower leg with fat layer exposed Follow-up Appointments o Return Appointment in 1 week. Bathing/ Shower/ Hygiene o May shower with wound dressing protected with water repellent cover or cast protector. Edema Control - Lymphedema / Segmental Compressive Device / Other o Patient to wear own compression stockings. Remove compression stockings every night before going to bed and put on every morning when getting up. -  right and left lower leg o Elevate, Exercise Daily and Avoid Standing for Long Periods of  Time. o Elevate legs to the level of the heart and pump ankles as often as possible o Elevate leg(s) parallel to the floor when sitting. Wound Treatment Wound #1 - Lower Leg Wound Laterality: Right, Medial Cleanser: Soap and Water 1 x Per Day/30 Days Discharge Instructions: Gently cleanse wound with antibacterial soap, rinse and pat dry prior to dressing wounds Peri-Wound Care: Triamcinolone Acetonide Cream, 0.1%, 15 (g) tube 1 x Per Day/30 Days Discharge Instructions: apply to peri area and wound bed Primary Dressing: Prisma 4.34 (in) 1 x Per Day/30 Days Discharge Instructions: Moisten w/normal saline or sterile water; Cover wound as directed. Do not remove from wound bed. Secondary Dressing: Bordered Gauze Sterile-HBD 4x4 (in/in) 1 x Per Day/30 Days Discharge Instructions: Cover wound with Bordered Guaze Sterile as directed Electronic Signature(s) Signed: 03/02/2021 5:03:48 PM By: Worthy Keeler PA-C Signed: 03/05/2021 7:11:39 PM By: Carlene Coria RN Entered By: Carlene Coria on 03/02/2021 09:33:26 Mcguffee, Garhett L. ([id number]) -------------------------------------------------------------------------------- Problem List Details Patient Name: HARGIS, VANDYNE L. Date of Service: 03/02/2021 9:00 AM Medical Record Number: [id number] Patient Account Number: [id number] Date of Birth/Sex: 1971/05/13 (50 y.o. M) Treating RN: Carlene Coria Primary Care Provider: PATIENT, NO Other Clinician: Referring Provider: Barkley Boards Treating Provider/Extender: Skipper Cliche in Treatment: 7 Active Problems ICD-10 Encounter Code Description Active Date MDM Diagnosis I87.2 Venous insufficiency (chronic) (peripheral) 01/12/2021 No Yes L97.812 Non-pressure chronic ulcer of other part of right lower leg with fat layer 01/12/2021 No Yes exposed Inactive Problems Resolved Problems Electronic Signature(s) Signed: 03/02/2021 9:27:50 AM By: Worthy Keeler PA-C Entered By: Worthy Keeler on 03/02/2021  09:27:50 Mesina, Zaccai L. ([id number]) -------------------------------------------------------------------------------- Progress Note Details Patient Name: Nicolas Cousin L. Date of Service: 03/02/2021 9:00 AM Medical Record Number: [id number] Patient Account Number: [id number] Date of Birth/Sex: 08/28/1970 (50 y.o. M) Treating RN: Carlene Coria Primary Care Provider: PATIENT, NO Other Clinician: Referring Provider: Barkley Boards Treating Provider/Extender: Skipper Cliche in Treatment: 7 Subjective Chief Complaint Information obtained from Patient Right LE Ulcer History of Present Illness (HPI) 01/12/2021 upon evaluation today patient appears to be doing somewhat poorly in regard to a wound on his right medial lower leg. Currently he tells me that this has been going on since around the beginning of April. He has done a telehealth visit with a physician who initially gave him a triamcinolone cream along with Keflex for 5 days. Subsequently he ended up being seen at walk-in urgent care where they also given Keflex for 7 days that seem to be doing better for him. Overall he tells me that things have improved from the standpoint of redness. Fortunately there does not appear to be any signs of systemic infection to be honest right now even locally I do not see any evidence of infection right now. He does have 1 main wound with some scattering areas that look like they may have been small ulcerations but again for the most part these have filled in. This does look almost to be more of a vasculitis type scenario based on what I am seeing. Patient does have chronic venous insufficiency but is not wearing any compression even though it sounds like he has been told before he should. 5/27; patient with small wounds on his right medial ankle. He has chronic venous insufficiency and stasis dermatitis a large open wound and several smaller areas all of which looks better than on presentation  last week  according to her nursing staff 01/26/2021 upon evaluation today patient appears to be doing well with regard to his wound. He is making good progress. With that being said there is some need currently for sharp debridement in regard to the wound there is some necrotic debris it is almost completely cleaned off but not completely. He is in agreement with that plan today. Fortunately there does not appear to be any evidence of active infection which is great and I am very pleased in that regard. He still wishes he did not have to wear the compression wrap. He did get his compression socks from [facility] therapy though they are in the 15 to 20 mmHg range they did not have any his length as he is a tall guy in the 20 to 30 mmHg range. 02/02/2021 upon evaluation today patient appears to be doing excellent in regard to his leg ulcer. He has been tolerating the dressing changes without complication. Fortunately there is no evidence of active infection at this time. No fevers, chills, nausea, vomiting, or diarrhea. 02/09/2021 upon evaluation today patient's wound actually showing signs of improvement. Fortunately there does not appear to be any evidence of active infection which is great news overall very pleased with where things stand today. 02/16/2021 upon evaluation today patient's wound is showing some signs of improvement. He did have a fairly aggressive debridement last week he notes that he had quite a bit of discomfort he was some swelling following. With that being said I think this was to be expected considering what we had to do from a debridement standpoint. The good news is there does not appear to be any evidence of infection currently nonetheless I do believe that the patient may have some signs of vasculitis here. Based on the overall appearance of the wound today and beginning to suspect this. For that reason I think he would benefit from starting him on triamcinolone ointment to the wound bed and  some of the immediate periwound to try to help out in this regard. 02/23/2021 upon evaluation today patient appears to be doing well with regard to his wound. He is actually making some progress here. The wound is roughly about the same size but the overall appearance of the wound bed is much improved. I do not see any signs of active infection at this time which is great news. No fevers, chills, nausea, vomiting, or diarrhea. 03/02/2021 upon evaluation today patient appears to be doing well with regard to his wound. I think the biggest issue I see right now is that with Hydrofera Blue this wound is staying somewhat dry. I think he could benefit from the use of collagen to try to help with getting this to stimulate some additional growth. He is in agreement with the plan. I think this also had a little bit more moisture which would be helpful at this point. Objective Constitutional Well-nourished and well-hydrated in no acute distress. Vitals Time Taken: 9:20 AM, Height: 78 in, Weight: 249 lbs, BMI: 28.8, Temperature: 98.4 F, Pulse: 64 bpm, Respiratory Rate: 18 breaths/min, Blood Pressure: 123/66 mmHg. Respiratory normal breathing without difficulty. PARKER, Eldo L. ([id number]) Psychiatric this patient is able to make decisions and demonstrates good insight into disease process. Alert and Oriented x 3. pleasant and cooperative. General Notes: Patient's wound bed did not require sharp debridement today. I believe that he is doing very well. I am pleased with the overall appearance currently. I do not see any evidence of infection  which is great news as well. Integumentary (Hair, Skin) Wound #1 status is Open. Original cause of wound was Gradually Appeared. The date acquired was: 11/24/2020. The wound has been in treatment 7 weeks. The wound is located on the Right,Medial Lower Leg. The wound measures 1cm length x 1.1cm width x 0.2cm depth; 0.864cm^2 area and 0.173cm^3 volume. There is Fat Layer  (Subcutaneous Tissue) exposed. There is no tunneling or undermining noted. There is a medium amount of serosanguineous drainage noted. The wound margin is flat and intact. There is small (1-33%) red, pink granulation within the wound bed. There is a large (67-100%) amount of necrotic tissue within the wound bed including Adherent Slough. Assessment Active Problems ICD-10 Venous insufficiency (chronic) (peripheral) Non-pressure chronic ulcer of other part of right lower leg with fat layer exposed Plan Follow-up Appointments: Return Appointment in 1 week. Bathing/ Shower/ Hygiene: May shower with wound dressing protected with water repellent cover or cast protector. Edema Control - Lymphedema / Segmental Compressive Device / Other: Patient to wear own compression stockings. Remove compression stockings every night before going to bed and put on every morning when getting up. - right and left lower leg Elevate, Exercise Daily and Avoid Standing for Long Periods of Time. Elevate legs to the level of the heart and pump ankles as often as possible Elevate leg(s) parallel to the floor when sitting. WOUND #1: - Lower Leg Wound Laterality: Right, Medial Cleanser: Soap and Water 1 x Per Day/30 Days Discharge Instructions: Gently cleanse wound with antibacterial soap, rinse and pat dry prior to dressing wounds Peri-Wound Care: Triamcinolone Acetonide Cream, 0.1%, 15 (g) tube 1 x Per Day/30 Days Discharge Instructions: apply to peri area and wound bed Primary Dressing: Prisma 4.34 (in) 1 x Per Day/30 Days Discharge Instructions: Moisten w/normal saline or sterile water; Cover wound as directed. Do not remove from wound bed. Secondary Dressing: Bordered Gauze Sterile-HBD 4x4 (in/in) 1 x Per Day/30 Days Discharge Instructions: Cover wound with Bordered Guaze Sterile as directed 1. Would recommend currently that we go ahead and continue with the silver collagen dressing at this point which I think is  good to be better for him and we will show him how to use that appropriately today. 2. I am also going to recommend that we have the patient continue with the triamcinolone around the edges of the wound. I think that is going to be helpful for him although hopefully he will need it much longer. 3. I am also can recommend that he should continue to wear the compression stocking. I think this is still an integral part of the treatment plan he is in agreement with that plan. We will see patient back for reevaluation in 1 week here in the clinic. If anything worsens or changes patient will contact our office for additional recommendations. Electronic Signature(s) Signed: 03/02/2021 9:53:03 AM By: Worthy Keeler PA-C Entered By: Worthy Keeler on 03/02/2021 09:53:03 [place], [place] ([id number]) -------------------------------------------------------------------------------- SuperBill Details Patient Name: Nicolas Cousin L. Date of Service: 03/02/2021 Medical Record Number: [id number] Patient Account Number: [id number] Date of Birth/Sex: 02-16-1971 (50 y.o. M) Treating RN: Carlene Coria Primary Care Provider: PATIENT, NO Other Clinician: Referring Provider: Barkley Boards Treating Provider/Extender: Skipper Cliche in Treatment: 7 Diagnosis Coding ICD-10 Codes Code Description I87.2 Venous insufficiency (chronic) (peripheral) L97.812 Non-pressure chronic ulcer of other part of right lower leg with fat layer exposed Facility Procedures CPT4 Code: [id number] Description: 99213 - WOUND CARE VISIT-LEV 3 EST PT Modifier: Quantity: 1  Physician Procedures CPT4 Code: [id number] Description: 78938 - WC PHYS LEVEL 3 - EST PT Modifier: Quantity: 1 CPT4 Code: Description: ICD-10 Diagnosis Description I87.2 Venous insufficiency (chronic) (peripheral) L97.812 Non-pressure chronic ulcer of other part of right lower leg with fat la Modifier: yer exposed Quantity: Electronic Signature(s) Signed: 03/02/2021  9:53:13 AM By: Worthy Keeler PA-C Entered By: Worthy Keeler on 03/02/2021 09:53:13

## 2021-03-06 NOTE — Progress Notes (Signed)
[name], [name] ([id number]) Visit Report for 03/02/2021 Arrival Information Details Patient Name: [name], [name]. Date of Service: 03/02/2021 9:00 AM Medical Record Number: [id number] Patient Account Number: [id number] Date of Birth/Sex: 12-Nov-1970 (50 y.o. M) Treating RN: Carlene Coria Primary Care Vaida Kerchner: PATIENT, NO Other Clinician: Referring Deepika Decatur: Barkley Boards Treating Jaquisha Frech/Extender: Skipper Cliche in Treatment: 7 Visit Information History Since Last Visit All ordered tests and consults were completed: No Patient Arrived: Ambulatory Added or deleted any medications: No Arrival Time: 09:18 Any new allergies or adverse reactions: No Accompanied By: self Had a fall or experienced change in No Transfer Assistance: None activities of daily living that may affect Patient Identification Verified: Yes risk of falls: Secondary Verification Process Completed: Yes Signs or symptoms of abuse/neglect since last visito No Patient Requires Transmission-Based Precautions: No Hospitalized since last visit: No Patient Has Alerts: Yes Implantable device outside of the clinic excluding No Patient Alerts: NOT DIABETIC cellular tissue based products placed in the center since last visit: Has Dressing in Place as Prescribed: Yes Pain Present Now: No Electronic Signature(s) Signed: 03/05/2021 7:11:39 PM By: Carlene Coria RN Entered By: Carlene Coria on 03/02/2021 09:20:40 [place], [place] ([id number]) -------------------------------------------------------------------------------- Clinic Level of Care Assessment Details Patient Name: Nicolas Cousin L. Date of Service: 03/02/2021 9:00 AM Medical Record Number: [id number] Patient Account Number: [id number] Date of Birth/Sex: November 06, 1970 (50 y.o. M) Treating RN: Carlene Coria Primary Care Iliani Vejar: PATIENT, NO Other Clinician: Referring Imraan Wendell: Barkley Boards Treating Aryka Coonradt/Extender: Skipper Cliche in Treatment: 7 Clinic Level of Care  Assessment Items TOOL 4 Quantity Score X - Use when only an EandM is performed on FOLLOW-UP visit 1 0 ASSESSMENTS - Nursing Assessment / Reassessment X - Reassessment of Co-morbidities (includes updates in patient status) 1 10 X- 1 5 Reassessment of Adherence to Treatment Plan ASSESSMENTS - Wound and Skin Assessment / Reassessment X - Simple Wound Assessment / Reassessment - one wound 1 5 _0  - 0 Complex Wound Assessment / Reassessment - multiple wounds _1  - 0 Dermatologic / Skin Assessment (not related to wound area) ASSESSMENTS - Focused Assessment _2  - Circumferential Edema Measurements - multi extremities 0 _3  - 0 Nutritional Assessment / Counseling / Intervention _4  - 0 Lower Extremity Assessment (monofilament, tuning fork, pulses) _5  - 0 Peripheral Arterial Disease Assessment (using hand held doppler) ASSESSMENTS - Ostomy and/or Continence Assessment and Care _6  - Incontinence Assessment and Management 0 _7  - 0 Ostomy Care Assessment and Management (repouching, etc.) PROCESS - Coordination of Care X - Simple Patient / Family Education for ongoing care 1 15 _8  - 0 Complex (extensive) Patient / Family Education for ongoing care _9  - 0 Staff obtains Programmer, systems, Records, Test Results / Process Orders _10  - 0 Staff telephones HHA, Nursing Homes / Clarify orders / etc _11  - 0 Routine Transfer to another Facility (non-emergent condition) _12  - 0 Routine Hospital Admission (non-emergent condition) _13  - 0 New Admissions / Biomedical engineer / Ordering NPWT, Apligraf, etc. _14  - 0 Emergency Hospital Admission (emergent condition) X- 1 10 Simple Discharge Coordination _15  - 0 Complex (extensive) Discharge Coordination PROCESS - Special Needs _16  - Pediatric / Minor Patient Management 0 _17  - 0 Isolation Patient Management _18  - 0 Hearing / Language / Visual special needs _19  - 0 Assessment of Community assistance (transportation, D/C planning, etc.) _20  - 0 Additional  assistance / Altered mentation _21  - 0 Support Surface(s) Assessment (bed, cushion, seat, etc.) INTERVENTIONS - Wound Cleansing / Measurement Hartfield, Eyan L. ([id number]) X- 1 5  Simple Wound Cleansing - one wound _0  - 0 Complex Wound Cleansing - multiple wounds X- 1 5 Wound Imaging (photographs - any number of wounds) _1  - 0 Wound Tracing (instead of photographs) X- 1 5 Simple Wound Measurement - one wound _2  - 0 Complex Wound Measurement - multiple wounds INTERVENTIONS - Wound Dressings X - Small Wound Dressing one or multiple wounds 1 10 _3  - 0 Medium Wound Dressing one or multiple wounds _4  - 0 Large Wound Dressing one or multiple wounds X- 1 5 Application of Medications - topical <XKGYJEHUDJSHFWYO>_3<\/ZCHYIFOYDXAJOINO>_6  - 0 Application of Medications - injection INTERVENTIONS - Miscellaneous _6  - External ear exam 0 _7  - 0 Specimen Collection (cultures, biopsies, blood, body fluids, etc.) _8  - 0 Specimen(s) / Culture(s) sent or taken to Lab for analysis _9  - 0 Patient Transfer (multiple staff / Civil Service fast streamer / Similar devices) _10  - 0 Simple Staple / Suture removal (25 or less) _11  - 0 Complex Staple / Suture removal (26 or more) _12  - 0 Hypo / Hyperglycemic Management (close monitor of Blood Glucose) _13  - 0 Ankle / Brachial Index (ABI) - do not check if billed separately X- 1 5 Vital Signs Has the patient been seen at the hospital within the last three years: Yes Total Score: 80 Level Of Care: New/Established - Level 3 Electronic Signature(s) Signed: 03/05/2021 7:11:39 PM By: Carlene Coria RN Entered By: Carlene Coria on 03/02/2021 09:32:18 Graves, Jarreau Carlean Jews ([id number]) -------------------------------------------------------------------------------- Encounter Discharge Information Details Patient Name: Nicolas Cousin L. Date of Service: 03/02/2021 9:00 AM Medical Record Number: [id number] Patient Account Number: [id number] Date of Birth/Sex: [birth year] (50 y.o. M) Treating RN: Carlene Coria Primary Care  Nyleah Mcginnis: PATIENT, NO Other Clinician: Referring Lareen Mullings: Barkley Boards Treating Madalyn Legner/Extender: Skipper Cliche in Treatment: 7 Encounter Discharge Information Items Discharge Condition: Stable Ambulatory Status: Ambulatory Discharge Destination: Home Transportation: Private Auto Accompanied By: self Schedule Follow-up Appointment: Yes Clinical Summary of Care: Patient Declined Electronic Signature(s) Signed: 03/05/2021 7:11:39 PM By: Carlene Coria RN Entered By: Carlene Coria on 03/02/2021 09:34:10 Kingsbury, Gio L. ([id number]) -------------------------------------------------------------------------------- Lower Extremity Assessment Details Patient Name: Nicolas Cousin L. Date of Service: 03/02/2021 9:00 AM Medical Record Number: [id number] Patient Account Number: [id number] Date of Birth/Sex: July 27, 1971 (50 y.o. M) Treating RN: Carlene Coria Primary Care Kaelyn Innocent: PATIENT, NO Other Clinician: Referring Sandro Burgo: Barkley Boards Treating Trevonn Hallum/Extender: Skipper Cliche in Treatment: 7 Edema Assessment Assessed: [Left: No] [Right: No] [Left: Edema] [Right: :] Calf Left: Right: Point of Measurement: 48 cm From Medial Instep 35 cm Ankle Left: Right: Point of Measurement: 10 cm From Medial Instep 25 cm Vascular Assessment Pulses: Dorsalis Pedis Palpable: [Right:Yes] Electronic Signature(s) Signed: 03/05/2021 7:11:39 PM By: Carlene Coria RN Entered By: Carlene Coria on 03/02/2021 09:26:56 [place], [place] ([id number]) -------------------------------------------------------------------------------- Multi Wound Chart Details Patient Name: Nicolas Cousin L. Date of Service: 03/02/2021 9:00 AM Medical Record Number: [id number] Patient Account Number: [id number] Date of Birth/Sex: 1971-06-22 (50 y.o. M) Treating RN: Carlene Coria Primary Care Lauralie Blacksher: PATIENT, NO Other Clinician: Referring Kirkland Figg: Barkley Boards Treating Vielka Klinedinst/Extender: Skipper Cliche in Treatment: 7 Vital  Signs Height(in): 78 Pulse(bpm): 63 Weight(lbs): 249 Blood Pressure(mmHg): 123/66 Body Mass Index(BMI): 29 Temperature(F): 98.4 Respiratory Rate(breaths/min): 18 Photos: [N/A:N/A] Wound Location: Right, Medial Lower Leg N/A N/A Wounding Event: Gradually Appeared N/A N/A Primary Etiology: Venous Leg Ulcer N/A N/A Date Acquired: 11/24/2020 N/A N/A Weeks of Treatment: 7 N/A N/A Wound Status: Open N/A N/A Measurements L x W x D (cm) 1x1.1x0.2 N/A N/A Area (cm) : 0.864  N/A N/A Volume (cm) : 0.173 N/A N/A % Reduction in Area: -1.90% N/A N/A % Reduction in Volume: -1.80% N/A N/A Classification: Full Thickness Without Exposed N/A N/A Support Structures Exudate Amount: Medium N/A N/A Exudate Type: Serosanguineous N/A N/A Exudate Color: red, brown N/A N/A Wound Margin: Flat and Intact N/A N/A Granulation Amount: Small (1-33%) N/A N/A Granulation Quality: Red, Pink N/A N/A Necrotic Amount: Large (67-100%) N/A N/A Exposed Structures: Fat Layer (Subcutaneous Tissue): N/A N/A Yes Fascia: No Tendon: No Muscle: No Joint: No Bone: No Epithelialization: Small (1-33%) N/A N/A Treatment Notes Electronic Signature(s) Signed: 03/05/2021 7:11:39 PM By: Carlene Coria RN Entered By: Carlene Coria on 03/02/2021 09:29:54 Zani, Jayel Carlean Jews ([id number]) -------------------------------------------------------------------------------- [place] Details Patient Name: Nicolas Cousin L. Date of Service: 03/02/2021 9:00 AM Medical Record Number: [id number] Patient Account Number: [id number] Date of Birth/Sex: 1971/03/06 (50 y.o. M) Treating RN: Carlene Coria Primary Care Tyerra Loretto: PATIENT, NO Other Clinician: Referring Tacarra Justo: Barkley Boards Treating Jalesa Thien/Extender: Skipper Cliche in Treatment: 7 Active Inactive Wound/Skin Impairment Nursing Diagnoses: Knowledge deficit related to ulceration/compromised skin integrity Goals: Patient/caregiver will verbalize understanding of  skin care regimen Date Initiated: 01/12/2021 Date Inactivated: 02/23/2021 Target Resolution Date: 02/12/2021 Goal Status: Met Ulcer/skin breakdown will have a volume reduction of 30% by week 4 Date Initiated: 01/12/2021 Date Inactivated: 02/23/2021 Target Resolution Date: 02/12/2021 Goal Status: Met Ulcer/skin breakdown will have a volume reduction of 50% by week 8 Date Initiated: 01/12/2021 Target Resolution Date: 03/14/2021 Goal Status: Active Ulcer/skin breakdown will have a volume reduction of 80% by week 12 Date Initiated: 01/12/2021 Target Resolution Date: 04/14/2021 Goal Status: Active Ulcer/skin breakdown will heal within 14 weeks Date Initiated: 01/12/2021 Target Resolution Date: 05/15/2021 Goal Status: Active Interventions: Assess patient/caregiver ability to obtain necessary supplies Assess patient/caregiver ability to perform ulcer/skin care regimen upon admission and as needed Assess ulceration(s) every visit Notes: Electronic Signature(s) Signed: 03/05/2021 7:11:39 PM By: Carlene Coria RN Entered By: Carlene Coria on 03/02/2021 09:29:46 [place], [place] ([id number]) -------------------------------------------------------------------------------- Pain Assessment Details Patient Name: Nicolas Cousin L. Date of Service: 03/02/2021 9:00 AM Medical Record Number: [id number] Patient Account Number: [id number] Date of Birth/Sex: 10/14/1970 (50 y.o. M) Treating RN: Carlene Coria Primary Care Jevin Camino: PATIENT, NO Other Clinician: Referring Robby Pirani: Barkley Boards Treating Avenly Roberge/Extender: Skipper Cliche in Treatment: 7 Active Problems Location of Pain Severity and Description of Pain Patient Has Paino No Site Locations Pain Management and Medication Current Pain Management: Electronic Signature(s) Signed: 03/05/2021 7:11:39 PM By: Carlene Coria RN Entered By: Carlene Coria on 03/02/2021 09:21:56 [name]  ([id number]) -------------------------------------------------------------------------------- Patient/Caregiver Education Details Patient Name: Nicolas Cousin L. Date of Service: 03/02/2021 9:00 AM Medical Record Number: [id number] Patient Account Number: [id number] Date of Birth/Gender: 1971-05-28 (50 y.o. M) Treating RN: Carlene Coria Primary Care Physician: PATIENT, NO Other Clinician: Referring Physician: Barkley Boards Treating Physician/Extender: Skipper Cliche in Treatment: 7 Education Assessment Education Provided To: Patient Education Topics Provided Wound/Skin Impairment: Methods: Explain/Verbal Responses: State content correctly Electronic Signature(s) Signed: 03/05/2021 7:11:39 PM By: Carlene Coria RN Entered By: Carlene Coria on 03/02/2021 09:32:34 [place], [place] ([id number]) -------------------------------------------------------------------------------- Wound Assessment Details Patient Name: Nicolas Cousin L. Date of Service: 03/02/2021 9:00 AM Medical Record Number: [id number] Patient Account Number: [id number] Date of Birth/Sex: Feb 18, 1971 (50 y.o. M) Treating RN: Carlene Coria Primary Care Kamica Florance: PATIENT, NO Other Clinician: Referring Hedy Garro: Barkley Boards Treating Brianah Hopson/Extender: Skipper Cliche in Treatment: 7 Wound Status Wound Number: 1 Primary Etiology: Venous Leg Ulcer Wound Location: Right, Medial Lower Leg  Wound Status: Open Wounding Event: Gradually Appeared Date Acquired: 11/24/2020 Weeks Of Treatment: 7 Clustered Wound: No Photos Wound Measurements Length: (cm) 1 Width: (cm) 1.1 Depth: (cm) 0.2 Area: (cm) 0.864 Volume: (cm) 0.173 % Reduction in Area: -1.9% % Reduction in Volume: -1.8% Epithelialization: Small (1-33%) Tunneling: No Undermining: No Wound Description Classification: Full Thickness Without Exposed Support Structu Wound Margin: Flat and Intact Exudate Amount: Medium Exudate Type: Serosanguineous Exudate Color: red,  brown res Foul Odor After Cleansing: No Slough/Fibrino Yes Wound Bed Granulation Amount: Small (1-33%) Exposed Structure Granulation Quality: Red, Pink Fascia Exposed: No Necrotic Amount: Large (67-100%) Fat Layer (Subcutaneous Tissue) Exposed: Yes Necrotic Quality: Adherent Slough Tendon Exposed: No Muscle Exposed: No Joint Exposed: No Bone Exposed: No Treatment Notes Wound #1 (Lower Leg) Wound Laterality: Right, Medial Cleanser Soap and Water Discharge Instruction: Gently cleanse wound with antibacterial soap, rinse and pat dry prior to dressing wounds Peri-Wound Care SLOANE, Erhard L. ([id number]) Triamcinolone Acetonide Cream, 0.1%, 15 (g) tube Discharge Instruction: apply to peri area and wound bed Topical Primary Dressing Prisma 4.34 (in) Discharge Instruction: Moisten w/normal saline or sterile water; Cover wound as directed. Do not remove from wound bed. Secondary Dressing Bordered Gauze Sterile-HBD 4x4 (in/in) Discharge Instruction: Cover wound with Bordered Guaze Sterile as directed Secured With Compression Wrap Compression Stockings Add-Ons Electronic Signature(s) Signed: 03/05/2021 7:11:39 PM By: Carlene Coria RN Entered By: Carlene Coria on 03/02/2021 09:25:03 [place], [place] ([id number]) -------------------------------------------------------------------------------- Vitals Details Patient Name: Nicolas Cousin L. Date of Service: 03/02/2021 9:00 AM Medical Record Number: [id number] Patient Account Number: [id number] Date of Birth/Sex: Jun 01, 1971 (50 y.o. M) Treating RN: Carlene Coria Primary Care Elishua Radford: PATIENT, NO Other Clinician: Referring Merrick Maggio: Barkley Boards Treating Caelin Rosen/Extender: Skipper Cliche in Treatment: 7 Vital Signs Time Taken: 09:20 Temperature (F): 98.4 Height (in): 78 Pulse (bpm): 64 Weight (lbs): 249 Respiratory Rate (breaths/min): 18 Body Mass Index (BMI): 28.8 Blood Pressure (mmHg): 123/66 Reference Range: 80 - 120 mg /  dl Electronic Signature(s) Signed: 03/05/2021 7:11:39 PM By: Carlene Coria RN Entered By: Carlene Coria on 03/02/2021 84:85:92

## 2021-03-09 ENCOUNTER — Other Ambulatory Visit: Payer: Self-pay

## 2021-03-09 ENCOUNTER — Encounter: Payer: Self-pay | Admitting: Physician Assistant

## 2021-03-09 NOTE — Progress Notes (Addendum)
LADARRIOUS, KIRKSEY ([id number]) Visit Report for 03/09/2021 Chief Complaint Document Details Patient Name: [name], [name]. Date of Service: 03/09/2021 9:30 AM Medical Record Number: [id number] Patient Account Number: [id number] Date of Birth/Sex: March 04, 1971 (50 y.o. M) Treating RN: Carlene Coria Primary Care Provider: PATIENT, NO Other Clinician: Referring Provider: Barkley Boards Treating Provider/Extender: Skipper Cliche in Treatment: 8 Information Obtained from: Patient Chief Complaint Right LE Ulcer Electronic Signature(s) Signed: 03/09/2021 9:53:26 AM By: Worthy Keeler PA-C Entered By: Worthy Keeler on 03/09/2021 09:53:25 Marron, Munir L. ([id number]) -------------------------------------------------------------------------------- HPI Details Patient Name: [name] L. Date of Service: 03/09/2021 9:30 AM Medical Record Number: [id number] Patient Account Number: [id number] Date of Birth/Sex: 1970/09/06 (50 y.o. M) Treating RN: Carlene Coria Primary Care Provider: PATIENT, NO Other Clinician: Referring Provider: Barkley Boards Treating Provider/Extender: Skipper Cliche in Treatment: 8 History of Present Illness HPI Description: 01/12/2021 upon evaluation today patient appears to be doing somewhat poorly in regard to a wound on his right medial lower leg. Currently he tells me that this has been going on since around the beginning of April. He has done a telehealth visit with a physician who initially gave him a triamcinolone cream along with Keflex for 5 days. Subsequently he ended up being seen at walk-in urgent care where they also given Keflex for 7 days that seem to be doing better for him. Overall he tells me that things have improved from the standpoint of redness. Fortunately there does not appear to be any signs of systemic infection to be honest right now even locally I do not see any evidence of infection right now. He does have 1 main wound with some scattering areas that  look like they may have been small ulcerations but again for the most part these have filled in. This does look almost to be more of a vasculitis type scenario based on what I am seeing. Patient does have chronic venous insufficiency but is not wearing any compression even though it sounds like he has been told before he should. 5/27; patient with small wounds on his right medial ankle. He has chronic venous insufficiency and stasis dermatitis a large open wound and several smaller areas all of which looks better than on presentation last week according to her nursing staff 01/26/2021 upon evaluation today patient appears to be doing well with regard to his wound. He is making good progress. With that being said there is some need currently for sharp debridement in regard to the wound there is some necrotic debris it is almost completely cleaned off but not completely. He is in agreement with that plan today. Fortunately there does not appear to be any evidence of active infection which is great and I am very pleased in that regard. He still wishes he did not have to wear the compression wrap. He did get his compression socks from elastic therapy though they are in the 15 to 20 mmHg range they did not have any his length as he is a tall guy in the 20 to 30 mmHg range. 02/02/2021 upon evaluation today patient appears to be doing excellent in regard to his leg ulcer. He has been tolerating the dressing changes without complication. Fortunately there is no evidence of active infection at this time. No fevers, chills, nausea, vomiting, or diarrhea. 02/09/2021 upon evaluation today patient's wound actually showing signs of improvement. Fortunately there does not appear to be any evidence of active infection which is great news overall very pleased with where  things stand today. 02/16/2021 upon evaluation today patient's wound is showing some signs of improvement. He did have a fairly aggressive debridement last  week he notes that he had quite a bit of discomfort he was some swelling following. With that being said I think this was to be expected considering what we had to do from a debridement standpoint. The good news is there does not appear to be any evidence of infection currently nonetheless I do believe that the patient may have some signs of vasculitis here. Based on the overall appearance of the wound today and beginning to suspect this. For that reason I think he would benefit from starting him on triamcinolone ointment to the wound bed and some of the immediate periwound to try to help out in this regard. 02/23/2021 upon evaluation today patient appears to be doing well with regard to his wound. He is actually making some progress here. The wound is roughly about the same size but the overall appearance of the wound bed is much improved. I do not see any signs of active infection at this time which is great news. No fevers, chills, nausea, vomiting, or diarrhea. 03/02/2021 upon evaluation today patient appears to be doing well with regard to his wound. I think the biggest issue I see right now is that with Hydrofera Blue this wound is staying somewhat dry. I think he could benefit from the use of collagen to try to help with getting this to stimulate some additional growth. He is in agreement with the plan. I think this also had a little bit more moisture which would be helpful at this point. 03/09/2021 on evaluation today patient appears to be doing about the same in regard to his wound is measuring a little bit smaller but still he does have the surface of the wound which is not quite as healthy as I would like to see. Fortunately there does not appear to be any active infection at this time. No fevers, chills, nausea, vomiting, or diarrhea. Electronic Signature(s) Signed: 03/09/2021 10:19:40 AM By: Worthy Keeler PA-C Entered By: Worthy Keeler on 03/09/2021 10:19:40 [name]  ([id number]) -------------------------------------------------------------------------------- Physical Exam Details Patient Name: [name], [name] Date of Service: 03/09/2021 9:30 AM Medical Record Number: [id number] Patient Account Number: [id number] Date of Birth/Sex: [birth year] (50 y.o. M) Treating RN: Carlene Coria Primary Care Provider: PATIENT, NO Other Clinician: Referring Provider: Barkley Boards Treating Provider/Extender: Skipper Cliche in Treatment: 8 Constitutional Well-nourished and well-hydrated in no acute distress. Respiratory normal breathing without difficulty. Psychiatric this patient is able to make decisions and demonstrates good insight into disease process. Alert and Oriented x 3. pleasant and cooperative. Notes Patient's wound bed actually showed signs of some good granulation starting to pick through although there is still some slough and biofilm buildup on the surface of the wound I was able to mechanically debride this away with saline gauze no sharp debridement necessary at this point. Electronic Signature(s) Signed: 03/09/2021 10:19:58 AM By: Worthy Keeler PA-C Entered By: Worthy Keeler on 03/09/2021 10:19:58 [name] ([id number]) -------------------------------------------------------------------------------- Physician Orders Details Patient Name: HUIE, GHUMAN L. Date of Service: 03/09/2021 9:30 AM Medical Record Number: [id number] Patient Account Number: [id number] Date of Birth/Sex: 01-Oct-1970 (50 y.o. M) Treating RN: Donnamarie Poag Primary Care Provider: PATIENT, NO Other Clinician: Referring Provider: Barkley Boards Treating Provider/Extender: Skipper Cliche in Treatment: 8 Verbal / Phone Orders: No Diagnosis Coding ICD-10 Coding Code Description I87.2 Venous insufficiency (chronic) (peripheral) T70.177  Non-pressure chronic ulcer of other part of right lower leg with fat layer exposed Follow-up Appointments o Return Appointment in 1  week. Bathing/ Shower/ Hygiene o May shower with wound dressing protected with water repellent cover or cast protector. Edema Control - Lymphedema / Segmental Compressive Device / Other o Patient to wear own compression stockings. Remove compression stockings every night before going to bed and put on every morning when getting up. - right and left lower leg o Elevate, Exercise Daily and Avoid Standing for Long Periods of Time. o Elevate legs to the level of the heart and pump ankles as often as possible o Elevate leg(s) parallel to the floor when sitting. Wound Treatment Wound #1 - Lower Leg Wound Laterality: Right, Medial Cleanser: Soap and Water Every Other Day/30 Days Discharge Instructions: Gently cleanse wound with antibacterial soap, rinse and pat dry prior to dressing wounds Peri-Wound Care: Triamcinolone Acetonide Cream, 0.1%, 15 (g) tube Every Other Day/30 Days Discharge Instructions: apply to peri area and wound bed Primary Dressing: IODOFLEX 0.9% Cadexomer Iodine Pad Every Other Day/30 Days Discharge Instructions: Apply Iodoflex to wound bed only as directed. Secondary Dressing: Bordered Gauze Sterile-HBD 4x4 (in/in) Every Other Day/30 Days Discharge Instructions: Cover wound with Bordered Guaze Sterile as directed Electronic Signature(s) Signed: 03/09/2021 4:42:29 PM By: Worthy Keeler PA-C Signed: 03/12/2021 2:49:51 PM By: Donnamarie Poag Entered By: Donnamarie Poag on 03/09/2021 10:00:30 [name] ([id number]) -------------------------------------------------------------------------------- Problem List Details Patient Name: [name], [name] Date of Service: 03/09/2021 9:30 AM Medical Record Number: [id number] Patient Account Number: [id number] Date of Birth/Sex: December 18, 1970 (49 y.o. M) Treating RN: Carlene Coria Primary Care Provider: PATIENT, NO Other Clinician: Referring Provider: Barkley Boards Treating Provider/Extender: Skipper Cliche in Treatment: 8 Active  Problems ICD-10 Encounter Code Description Active Date MDM Diagnosis I87.2 Venous insufficiency (chronic) (peripheral) 01/12/2021 No Yes L97.812 Non-pressure chronic ulcer of other part of right lower leg with fat layer 01/12/2021 No Yes exposed Inactive Problems Resolved Problems Electronic Signature(s) Signed: 03/09/2021 9:53:16 AM By: Worthy Keeler PA-C Entered By: Worthy Keeler on 03/09/2021 09:53:16 [name], [name] ([id number]) -------------------------------------------------------------------------------- Progress Note Details Patient Name: [name] L. Date of Service: 03/09/2021 9:30 AM Medical Record Number: [id number] Patient Account Number: [id number] Date of Birth/Sex: November 02, 1970 (50 y.o. M) Treating RN: Carlene Coria Primary Care Provider: PATIENT, NO Other Clinician: Referring Provider: Barkley Boards Treating Provider/Extender: Skipper Cliche in Treatment: 8 Subjective Chief Complaint Information obtained from Patient Right LE Ulcer History of Present Illness (HPI) 01/12/2021 upon evaluation today patient appears to be doing somewhat poorly in regard to a wound on his right medial lower leg. Currently he tells me that this has been going on since around the beginning of April. He has done a telehealth visit with a physician who initially gave him a triamcinolone cream along with Keflex for 5 days. Subsequently he ended up being seen at walk-in urgent care where they also given Keflex for 7 days that seem to be doing better for him. Overall he tells me that things have improved from the standpoint of redness. Fortunately there does not appear to be any signs of systemic infection to be honest right now even locally I do not see any evidence of infection right now. He does have 1 main wound with some scattering areas that look like they may have been small ulcerations but again for the most part these have filled in. This does look almost to be more of a vasculitis  type scenario  based on what I am seeing. Patient does have chronic venous insufficiency but is not wearing any compression even though it sounds like he has been told before he should. 5/27; patient with small wounds on his right medial ankle. He has chronic venous insufficiency and stasis dermatitis a large open wound and several smaller areas all of which looks better than on presentation last week according to her nursing staff 01/26/2021 upon evaluation today patient appears to be doing well with regard to his wound. He is making good progress. With that being said there is some need currently for sharp debridement in regard to the wound there is some necrotic debris it is almost completely cleaned off but not completely. He is in agreement with that plan today. Fortunately there does not appear to be any evidence of active infection which is great and I am very pleased in that regard. He still wishes he did not have to wear the compression wrap. He did get his compression socks from elastic therapy though they are in the 15 to 20 mmHg range they did not have any his length as he is a tall guy in the 20 to 30 mmHg range. 02/02/2021 upon evaluation today patient appears to be doing excellent in regard to his leg ulcer. He has been tolerating the dressing changes without complication. Fortunately there is no evidence of active infection at this time. No fevers, chills, nausea, vomiting, or diarrhea. 02/09/2021 upon evaluation today patient's wound actually showing signs of improvement. Fortunately there does not appear to be any evidence of active infection which is great news overall very pleased with where things stand today. 02/16/2021 upon evaluation today patient's wound is showing some signs of improvement. He did have a fairly aggressive debridement last week he notes that he had quite a bit of discomfort he was some swelling following. With that being said I think this was to be expected  considering what we had to do from a debridement standpoint. The good news is there does not appear to be any evidence of infection currently nonetheless I do believe that the patient may have some signs of vasculitis here. Based on the overall appearance of the wound today and beginning to suspect this. For that reason I think he would benefit from starting him on triamcinolone ointment to the wound bed and some of the immediate periwound to try to help out in this regard. 02/23/2021 upon evaluation today patient appears to be doing well with regard to his wound. He is actually making some progress here. The wound is roughly about the same size but the overall appearance of the wound bed is much improved. I do not see any signs of active infection at this time which is great news. No fevers, chills, nausea, vomiting, or diarrhea. 03/02/2021 upon evaluation today patient appears to be doing well with regard to his wound. I think the biggest issue I see right now is that with Hydrofera Blue this wound is staying somewhat dry. I think he could benefit from the use of collagen to try to help with getting this to stimulate some additional growth. He is in agreement with the plan. I think this also had a little bit more moisture which would be helpful at this point. 03/09/2021 on evaluation today patient appears to be doing about the same in regard to his wound is measuring a little bit smaller but still he does have the surface of the wound which is not quite as healthy as  I would like to see. Fortunately there does not appear to be any active infection at this time. No fevers, chills, nausea, vomiting, or diarrhea. Objective Constitutional Well-nourished and well-hydrated in no acute distress. Vitals Time Taken: 9:45 AM, Height: 78 in, Weight: 249 lbs, BMI: 28.8, Temperature: 98.7 F, Pulse: 65 bpm, Respiratory Rate: 16 breaths/min, Blood Pressure: 125/79 mmHg. [name], [name]  ([id number]) Respiratory normal breathing without difficulty. Psychiatric this patient is able to make decisions and demonstrates good insight into disease process. Alert and Oriented x 3. pleasant and cooperative. General Notes: Patient's wound bed actually showed signs of some good granulation starting to pick through although there is still some slough and biofilm buildup on the surface of the wound I was able to mechanically debride this away with saline gauze no sharp debridement necessary at this point. Integumentary (Hair, Skin) Wound #1 status is Open. Original cause of wound was Gradually Appeared. The date acquired was: 11/24/2020. The wound has been in treatment 8 weeks. The wound is located on the Right,Medial Lower Leg. The wound measures 0.9cm length x 1cm width x 0.2cm depth; 0.707cm^2 area and 0.141cm^3 volume. There is Fat Layer (Subcutaneous Tissue) exposed. There is no tunneling or undermining noted. There is a medium amount of serosanguineous drainage noted. The wound margin is flat and intact. There is small (1-33%) red, pink granulation within the wound bed. There is a large (67-100%) amount of necrotic tissue within the wound bed including Adherent Slough. Assessment Active Problems ICD-10 Venous insufficiency (chronic) (peripheral) Non-pressure chronic ulcer of other part of right lower leg with fat layer exposed Plan Follow-up Appointments: Return Appointment in 1 week. Bathing/ Shower/ Hygiene: May shower with wound dressing protected with water repellent cover or cast protector. Edema Control - Lymphedema / Segmental Compressive Device / Other: Patient to wear own compression stockings. Remove compression stockings every night before going to bed and put on every morning when getting up. - right and left lower leg Elevate, Exercise Daily and Avoid Standing for Long Periods of Time. Elevate legs to the level of the heart and pump ankles as often as possible Elevate  leg(s) parallel to the floor when sitting. WOUND #1: - Lower Leg Wound Laterality: Right, Medial Cleanser: Soap and Water Every Other Day/30 Days Discharge Instructions: Gently cleanse wound with antibacterial soap, rinse and pat dry prior to dressing wounds Peri-Wound Care: Triamcinolone Acetonide Cream, 0.1%, 15 (g) tube Every Other Day/30 Days Discharge Instructions: apply to peri area and wound bed Primary Dressing: IODOFLEX 0.9% Cadexomer Iodine Pad Every Other Day/30 Days Discharge Instructions: Apply Iodoflex to wound bed only as directed. Secondary Dressing: Bordered Gauze Sterile-HBD 4x4 (in/in) Every Other Day/30 Days Discharge Instructions: Cover wound with Bordered Guaze Sterile as directed 1. Would recommend that we going continue with wound care measures as before and the patient is in agreement with the plan with regard to the triamcinolone around the edges of the wound that is looking a lot better. 2. With regard to the wound itself I am going to use Iodoflex to help with this I think that is can be the best option and the patient is in agreement with that plan. 3. We will see how things look next week. I know that he did have ABIs at 1.59 I doubt based on what I am saying that he has any significant vascular compromise. With that being said if things continue to not heal as well as we would like him a significant formal arterial studies at  his feet to do that especially since he is self-pay if its not something that is can affect his healing 1 way or another. Again he seems to have fairly good blood flow with good capillary refill and again pulses as well. We will see patient back for reevaluation in 1 week here in the clinic. If anything worsens or changes patient will contact our office for additional recommendations. Electronic Signature(s) [name], [name] ([id number]) Signed: 03/09/2021 10:21:23 AM By: Worthy Keeler PA-C Entered By: Worthy Keeler on 03/09/2021  10:21:23 [name] ([id number]) -------------------------------------------------------------------------------- SuperBill Details Patient Name: [name] L. Date of Service: 03/09/2021 Medical Record Number: [id number] Patient Account Number: [id number] Date of Birth/Sex: Mar 26, 1971 (50 y.o. M) Treating RN: Donnamarie Poag Primary Care Provider: PATIENT, NO Other Clinician: Referring Provider: Barkley Boards Treating Provider/Extender: Skipper Cliche in Treatment: 8 Diagnosis Coding ICD-10 Codes Code Description I87.2 Venous insufficiency (chronic) (peripheral) L97.812 Non-pressure chronic ulcer of other part of right lower leg with fat layer exposed Facility Procedures CPT4 Code: [id number] Description: [id number] - WOUND CARE VISIT-LEV 2 EST PT Modifier: Quantity: 1 Physician Procedures CPT4 Code: [id number] Description: 77034 - WC PHYS LEVEL 3 - EST PT Modifier: Quantity: 1 CPT4 Code: Description: ICD-10 Diagnosis Description I87.2 Venous insufficiency (chronic) (peripheral) L97.812 Non-pressure chronic ulcer of other part of right lower leg with fat la Modifier: yer exposed Quantity: Electronic Signature(s) Signed: 03/09/2021 10:21:33 AM By: Worthy Keeler PA-C Entered By: Worthy Keeler on 03/09/2021 10:21:33

## 2021-03-12 NOTE — Progress Notes (Signed)
TRAE, BOVENZI ([id number]) Visit Report for 03/09/2021 Arrival Information Details Patient Name: [name], [name]. Date of Service: 03/09/2021 9:30 AM Medical Record Number: [id number] Patient Account Number: [id number] Date of Birth/Sex: 08-05-1971 (50 y.o. M) Treating RN: Donnamarie Poag Primary Care Provider: PATIENT, NO Other Clinician: Referring Provider: Barkley Boards Treating Provider/Extender: Skipper Cliche in Treatment: 8 Visit Information History Since Last Visit Added or deleted any medications: No Patient Arrived: Ambulatory Had a fall or experienced change in No Arrival Time: 09:40 activities of daily living that may affect Accompanied By: self risk of falls: Transfer Assistance: None Hospitalized since last visit: No Patient Identification Verified: Yes Has Dressing in Place as Prescribed: Yes Secondary Verification Process Completed: Yes Pain Present Now: No Patient Requires Transmission-Based Precautions: No Patient Has Alerts: Yes Patient Alerts: NOT DIABETIC Electronic Signature(s) Signed: 03/12/2021 2:49:51 PM By: Donnamarie Poag Entered By: Donnamarie Poag on 03/09/2021 09:45:36 [name], [name] ([id number]) -------------------------------------------------------------------------------- Clinic Level of Care Assessment Details Patient Name: Nicolas Cousin L. Date of Service: 03/09/2021 9:30 AM Medical Record Number: [id number] Patient Account Number: [id number] Date of Birth/Sex: 02-01-1971 (50 y.o. M) Treating RN: Donnamarie Poag Primary Care Provider: PATIENT, NO Other Clinician: Referring Provider: Barkley Boards Treating Provider/Extender: Skipper Cliche in Treatment: 8 Clinic Level of Care Assessment Items TOOL 4 Quantity Score [] - Use when only an EandM is performed on FOLLOW-UP visit 0 ASSESSMENTS - Nursing Assessment / Reassessment [] - Reassessment of Co-morbidities (includes updates in patient status) 0 [] - 0 Reassessment of Adherence to Treatment  Plan ASSESSMENTS - Wound and Skin Assessment / Reassessment X - Simple Wound Assessment / Reassessment - one wound 1 5 [] - 0 Complex Wound Assessment / Reassessment - multiple wounds [] - 0 Dermatologic / Skin Assessment (not related to wound area) ASSESSMENTS - Focused Assessment [] - Circumferential Edema Measurements - multi extremities 0 [] - 0 Nutritional Assessment / Counseling / Intervention [] - 0 Lower Extremity Assessment (monofilament, tuning fork, pulses) [] - 0 Peripheral Arterial Disease Assessment (using hand held doppler) ASSESSMENTS - Ostomy and/or Continence Assessment and Care [] - Incontinence Assessment and Management 0 [] - 0 Ostomy Care Assessment and Management (repouching, etc.) PROCESS - Coordination of Care X - Simple Patient / Family Education for ongoing care 1 15 [] - 0 Complex (extensive) Patient / Family Education for ongoing care [] - 0 Staff obtains Programmer, systems, Records, Test Results / Process Orders [] - 0 Staff telephones HHA, Nursing Homes / Clarify orders / etc [] - 0 Routine Transfer to another Facility (non-emergent condition) [] - 0 Routine Hospital Admission (non-emergent condition) [] - 0 New Admissions / Biomedical engineer / Ordering NPWT, Apligraf, etc. [] - 0 Emergency Hospital Admission (emergent condition) X- 1 10 Simple Discharge Coordination [] - 0 Complex (extensive) Discharge Coordination PROCESS - Special Needs [] - Pediatric / Minor Patient Management 0 [] - 0 Isolation Patient Management [] - 0 Hearing / Language / Visual special needs [] - 0 Assessment of Community assistance (transportation, D/C planning, etc.) [] - 0 Additional assistance / Altered mentation [] - 0 Support Surface(s) Assessment (bed, cushion, seat, etc.) INTERVENTIONS - Wound Cleansing / Measurement [place], Deandrea L. ([id number]) X- 1 5 Simple Wound Cleansing - one wound [] - 0 Complex Wound Cleansing - multiple wounds [] - 0 Wound  Imaging (photographs - any number of wounds) [] - 0 Wound Tracing (instead of photographs) X- 1 5 Simple Wound Measurement - one wound [] - 0  Complex Wound Measurement - multiple wounds INTERVENTIONS - Wound Dressings X - Small Wound Dressing one or multiple wounds 1 10 [] - 0 Medium Wound Dressing one or multiple wounds [] - 0 Large Wound Dressing one or multiple wounds [] - 0 Application of Medications - topical [] - 0 Application of Medications - injection INTERVENTIONS - Miscellaneous [] - External ear exam 0 [] - 0 Specimen Collection (cultures, biopsies, blood, body fluids, etc.) [] - 0 Specimen(s) / Culture(s) sent or taken to Lab for analysis [] - 0 Patient Transfer (multiple staff / Civil Service fast streamer / Similar devices) [] - 0 Simple Staple / Suture removal (25 or less) [] - 0 Complex Staple / Suture removal (26 or more) [] - 0 Hypo / Hyperglycemic Management (close monitor of Blood Glucose) [] - 0 Ankle / Brachial Index (ABI) - do not check if billed separately X- 1 5 Vital Signs Has the patient been seen at the hospital within the last three years: Yes Total Score: 55 Level Of Care: New/Established - Level 2 Electronic Signature(s) Signed: 03/12/2021 2:49:51 PM By: Donnamarie Poag Entered By: Donnamarie Poag on 03/09/2021 09:59:54 [name], [name] ([id number]) -------------------------------------------------------------------------------- Encounter Discharge Information Details Patient Name: Nicolas Cousin L. Date of Service: 03/09/2021 9:30 AM Medical Record Number: [id number] Patient Account Number: [id number] Date of Birth/Sex: January 26, 1971 (50 y.o. M) Treating RN: Donnamarie Poag Primary Care Provider: PATIENT, NO Other Clinician: Referring Provider: Barkley Boards Treating Provider/Extender: Skipper Cliche in Treatment: 8 Encounter Discharge Information Items Discharge Condition: Stable Ambulatory Status: Ambulatory Discharge Destination: Home Transportation: Private  Auto Accompanied By: self Schedule Follow-up Appointment: Yes Clinical Summary of Care: Electronic Signature(s) Signed: 03/12/2021 2:49:51 PM By: Donnamarie Poag Entered By: Donnamarie Poag on 03/09/2021 10:06:59 [name], [name] ([id number]) -------------------------------------------------------------------------------- Lower Extremity Assessment Details Patient Name: [name], [name] Date of Service: 03/09/2021 9:30 AM Medical Record Number: [id number] Patient Account Number: [id number] Date of Birth/Sex: 10/31/1970 (50 y.o. M) Treating RN: Donnamarie Poag Primary Care Provider: PATIENT, NO Other Clinician: Referring Provider: Barkley Boards Treating Provider/Extender: Skipper Cliche in Treatment: 8 Edema Assessment Assessed: [Left: No] [Right: Yes] Edema: [Left: N] [Right: o] Calf Left: Right: Point of Measurement: 48 cm From Medial Instep 36 cm Ankle Left: Right: Point of Measurement: 10 cm From Medial Instep 25 cm Knee To Floor Left: Right: From Medial Instep 55 cm Vascular Assessment Pulses: Dorsalis Pedis Palpable: [Right:Yes] Electronic Signature(s) Signed: 03/12/2021 2:49:51 PM By: Donnamarie Poag Entered By: Donnamarie Poag on 03/09/2021 09:51:32 [place], [place] ([id number]) -------------------------------------------------------------------------------- Multi Wound Chart Details Patient Name: Nicolas Cousin L. Date of Service: 03/09/2021 9:30 AM Medical Record Number: [id number] Patient Account Number: [id number] Date of Birth/Sex: 10/29/1970 (49 y.o. M) Treating RN: Donnamarie Poag Primary Care Provider: PATIENT, NO Other Clinician: Referring Provider: Barkley Boards Treating Provider/Extender: Skipper Cliche in Treatment: 8 Vital Signs Height(in): 78 Pulse(bpm): 47 Weight(lbs): 249 Blood Pressure(mmHg): 125/79 Body Mass Index(BMI): 29 Temperature(F): 98.7 Respiratory Rate(breaths/min): 16 Photos: [N/A:N/A] Wound Location: Right, Medial Lower Leg N/A N/A Wounding Event:  Gradually Appeared N/A N/A Primary Etiology: Venous Leg Ulcer N/A N/A Date Acquired: 11/24/2020 N/A N/A Weeks of Treatment: 8 N/A N/A Wound Status: Open N/A N/A Measurements L x W x D (cm) 0.9x1x0.2 N/A N/A Area (cm) : 0.707 N/A N/A Volume (cm) : 0.141 N/A N/A % Reduction in Area: 16.60% N/A N/A % Reduction in Volume: 17.10% N/A N/A Classification: Full Thickness Without Exposed N/A N/A Support Structures Exudate Amount: Medium N/A N/A Exudate Type: Serosanguineous N/A  N/A Exudate Color: red, brown N/A N/A Wound Margin: Flat and Intact N/A N/A Granulation Amount: Small (1-33%) N/A N/A Granulation Quality: Red, Pink N/A N/A Necrotic Amount: Large (67-100%) N/A N/A Exposed Structures: Fat Layer (Subcutaneous Tissue): N/A N/A Yes Fascia: No Tendon: No Muscle: No Joint: No Bone: No Epithelialization: Small (1-33%) N/A N/A Treatment Notes Electronic Signature(s) Signed: 03/12/2021 2:49:51 PM By: Donnamarie Poag Entered By: Donnamarie Poag on 03/09/2021 09:52:17 [name], [name] ([id number]) -------------------------------------------------------------------------------- [place] Details Patient Name: [name], [name] Date of Service: 03/09/2021 9:30 AM Medical Record Number: [id number] Patient Account Number: [id number] Date of Birth/Sex: [birth year] (50 y.o. M) Treating RN: Donnamarie Poag Primary Care Amanda Pote: PATIENT, NO Other Clinician: Referring Haston Casebolt: Barkley Boards Treating Tirth Cothron/Extender: Skipper Cliche in Treatment: 8 Active Inactive Wound/Skin Impairment Nursing Diagnoses: Knowledge deficit related to ulceration/compromised skin integrity Goals: Patient/caregiver will verbalize understanding of skin care regimen Date Initiated: 01/12/2021 Date Inactivated: 02/23/2021 Target Resolution Date: 02/12/2021 Goal Status: Met Ulcer/skin breakdown will have a volume reduction of 30% by week 4 Date Initiated: 01/12/2021 Date Inactivated: 02/23/2021 Target  Resolution Date: 02/12/2021 Goal Status: Met Ulcer/skin breakdown will have a volume reduction of 50% by week 8 Date Initiated: 01/12/2021 Target Resolution Date: 03/14/2021 Goal Status: Active Ulcer/skin breakdown will have a volume reduction of 80% by week 12 Date Initiated: 01/12/2021 Target Resolution Date: 04/14/2021 Goal Status: Active Ulcer/skin breakdown will heal within 14 weeks Date Initiated: 01/12/2021 Target Resolution Date: 05/15/2021 Goal Status: Active Interventions: Assess patient/caregiver ability to obtain necessary supplies Assess patient/caregiver ability to perform ulcer/skin care regimen upon admission and as needed Assess ulceration(s) every visit Notes: Electronic Signature(s) Signed: 03/12/2021 2:49:51 PM By: Donnamarie Poag Entered By: Donnamarie Poag on 03/09/2021 09:52:09 [name], [name] ([id number]) -------------------------------------------------------------------------------- Pain Assessment Details Patient Name: Nicolas Cousin L. Date of Service: 03/09/2021 9:30 AM Medical Record Number: [id number] Patient Account Number: [id number] Date of Birth/Sex: 02-Aug-1971 (50 y.o. M) Treating RN: Donnamarie Poag Primary Care Syair Fricker: PATIENT, NO Other Clinician: Referring Samvel Zinn: Barkley Boards Treating Migel Hannis/Extender: Skipper Cliche in Treatment: 8 Active Problems Location of Pain Severity and Description of Pain Patient Has Paino No Site Locations Rate the pain. Current Pain Level: 0 Pain Management and Medication Current Pain Management: Electronic Signature(s) Signed: 03/12/2021 2:49:51 PM By: Donnamarie Poag Entered By: Donnamarie Poag on 03/09/2021 09:46:11 [place], [place] ([id number]) -------------------------------------------------------------------------------- Patient/Caregiver Education Details Patient Name: Nicolas Cousin L. Date of Service: 03/09/2021 9:30 AM Medical Record Number: [id number] Patient Account Number: [id number] Date of Birth/Gender: May 16, 1971  (50 y.o. M) Treating RN: Donnamarie Poag Primary Care Physician: PATIENT, NO Other Clinician: Referring Physician: Barkley Boards Treating Physician/Extender: Skipper Cliche in Treatment: 8 Education Assessment Education Provided To: Patient Education Topics Provided Basic Hygiene: Wound Debridement: Wound/Skin Impairment: Electronic Signature(s) Signed: 03/12/2021 2:49:51 PM By: Donnamarie Poag Entered By: Donnamarie Poag on 03/09/2021 09:58:00 [name], [name] ([id number]) -------------------------------------------------------------------------------- Wound Assessment Details Patient Name: [name], [name] Date of Service: 03/09/2021 9:30 AM Medical Record Number: [id number] Patient Account Number: [id number] Date of Birth/Sex: [birth year] (50 y.o. M) Treating RN: Donnamarie Poag Primary Care Danialle Dement: PATIENT, NO Other Clinician: Referring Corderius Saraceni: Barkley Boards Treating Ronae Noell/Extender: Skipper Cliche in Treatment: 8 Wound Status Wound Number: 1 Primary Etiology: Venous Leg Ulcer Wound Location: Right, Medial Lower Leg Wound Status: Open Wounding Event: Gradually Appeared Date Acquired: 11/24/2020 Weeks Of Treatment: 8 Clustered Wound: No Photos Wound Measurements Length: (cm) 0.9 Width: (cm) 1 Depth: (cm) 0.2 Area: (cm) 0.707 Volume: (cm) 0.141 % Reduction in Area:  16.6% % Reduction in Volume: 17.1% Epithelialization: Small (1-33%) Tunneling: No Undermining: No Wound Description Classification: Full Thickness Without Exposed Support Structu Wound Margin: Flat and Intact Exudate Amount: Medium Exudate Type: Serosanguineous Exudate Color: red, brown res Foul Odor After Cleansing: No Slough/Fibrino Yes Wound Bed Granulation Amount: Small (1-33%) Exposed Structure Granulation Quality: Red, Pink Fascia Exposed: No Necrotic Amount: Large (67-100%) Fat Layer (Subcutaneous Tissue) Exposed: Yes Necrotic Quality: Adherent Slough Tendon Exposed: No Muscle Exposed: No Joint  Exposed: No Bone Exposed: No Treatment Notes Wound #1 (Lower Leg) Wound Laterality: Right, Medial Cleanser Soap and Water Discharge Instruction: Gently cleanse wound with antibacterial soap, rinse and pat dry prior to dressing wounds Peri-Wound Care [name], [name] ([id number]) Triamcinolone Acetonide Cream, 0.1%, 15 (g) tube Discharge Instruction: apply to peri area and wound bed Topical Primary Dressing IODOFLEX 0.9% Cadexomer Iodine Pad Discharge Instruction: Apply Iodoflex to wound bed only as directed. Secondary Dressing Bordered Gauze Sterile-HBD 4x4 (in/in) Discharge Instruction: Cover wound with Bordered Guaze Sterile as directed Secured With Compression Wrap Compression Stockings Add-Ons Electronic Signature(s) Signed: 03/12/2021 2:49:51 PM By: Donnamarie Poag Entered By: Donnamarie Poag on 03/09/2021 [place], [place] ([id number]) -------------------------------------------------------------------------------- Vitals Details Patient Name: Nicolas Cousin L. Date of Service: 03/09/2021 9:30 AM Medical Record Number: [id number] Patient Account Number: [id number] Date of Birth/Sex: 1970/12/21 (50 y.o. M) Treating RN: Donnamarie Poag Primary Care Prinston Kynard: PATIENT, NO Other Clinician: Referring Lexus Shampine: Barkley Boards Treating Squire Withey/Extender: Skipper Cliche in Treatment: 8 Vital Signs Time Taken: 09:45 Temperature (F): 98.7 Height (in): 78 Pulse (bpm): 65 Weight (lbs): 249 Respiratory Rate (breaths/min): 16 Body Mass Index (BMI): 28.8 Blood Pressure (mmHg): 125/79 Reference Range: 80 - 120 mg / dl Electronic Signature(s) Signed: 03/12/2021 2:49:51 PM By: Donnamarie Poag Entered ByDonnamarie Poag on 03/09/2021 09:46:03

## 2021-03-16 ENCOUNTER — Other Ambulatory Visit: Payer: Self-pay

## 2021-03-16 ENCOUNTER — Encounter: Payer: Self-pay | Admitting: Physician Assistant

## 2021-03-16 NOTE — Progress Notes (Addendum)
[name] ([id number]) Visit Report for 03/16/2021 Chief Complaint Document Details Patient Name: [name], [name]. Date of Service: 03/16/2021 9:00 AM Medical Record Number: [id number] Patient Account Number: [id number] Date of Birth/Sex: [birth year] (50 y.o. M) Treating RN: Primary Care Provider: PATIENT, NO Other Clinician: Referring Provider: Barkley Boards Treating Provider/Extender: Eduard Roux in Treatment: 9 Information Obtained from: Patient Chief Complaint Right LE Ulcer Electronic Signature(s) Signed: 03/16/2021 9:29:29 AM By: Worthy Keeler PA-C Entered By: Worthy Keeler on 03/16/2021 09:29:29 [name] ([id number]) -------------------------------------------------------------------------------- Debridement Details Patient Name: Nicolas Cousin L. Date of Service: 03/16/2021 9:00 AM Medical Record Number: [id number] Patient Account Number: [id number] Date of Birth/Sex: 12-01-1970 (50 y.o. M) Treating RN: Carlene Coria Primary Care Provider: PATIENT, NO Other Clinician: Referring Provider: Barkley Boards Treating Provider/Extender: Joaquim Lai, ABIGAIL Weeks in Treatment: 9 Debridement Performed for Wound #1 Right,Medial Lower Leg Assessment: Performed By: Physician STONE, ABIGAIL, Debridement Type: Debridement Severity of Tissue Pre Debridement: Fat layer exposed Level of Consciousness (Pre- Awake and Alert procedure): Pre-procedure Verification/Time Out Yes - 09:34 Taken: Start Time: 09:34 Pain Control: Lidocaine 4% Topical Solution Total Area Debrided (L x W): 1.1 (cm) x 1.1 (cm) = 1.21 (cm) Tissue and other material Subcutaneous, Skin: Dermis , Skin: Epidermis debrided: Level: Skin/Subcutaneous Tissue Debridement Description: Excisional Instrument: Curette Bleeding: Moderate Hemostasis Achieved: Pressure End Time: 09:36 Procedural Pain: 0 Post Procedural Pain: 0 Response to Treatment: Procedure was tolerated well Level of Consciousness  (Post- Awake and Alert procedure): Post Debridement Measurements of Total Wound Length: (cm) 1.1 Width: (cm) 1.1 Depth: (cm) 0.3 Volume: (cm) 0.285 Character of Wound/Ulcer Post Debridement: Improved Severity of Tissue Post Debridement: Fat layer exposed Post Procedure Diagnosis Same as Pre-procedure Electronic Signature(s) Signed: 03/23/2021 4:33:49 PM By: Carlene Coria RN Entered By: Carlene Coria on 03/16/2021 09:36:11 [name], [name] ([id number]) -------------------------------------------------------------------------------- HPI Details Patient Name: Nicolas Cousin L. Date of Service: 03/16/2021 9:00 AM Medical Record Number: [id number] Patient Account Number: [id number] Date of Birth/Sex: 1971/01/12 (50 y.o. M) Treating RN: Primary Care Provider: PATIENT, NO Other Clinician: Referring Provider: Barkley Boards Treating Provider/Extender: Eduard Roux in Treatment: 9 History of Present Illness HPI Description: 01/12/2021 upon evaluation today patient appears to be doing somewhat poorly in regard to a wound on his right medial lower leg. Currently he tells me that this has been going on since around the beginning of April. He has done a telehealth visit with a physician who initially gave him a triamcinolone cream along with Keflex for 5 days. Subsequently he ended up being seen at walk-in urgent care where they also given Keflex for 7 days that seem to be doing better for him. Overall he tells me that things have improved from the standpoint of redness. Fortunately there does not appear to be any signs of systemic infection to be honest right now even locally I do not see any evidence of infection right now. He does have 1 main wound with some scattering areas that look like they may have been small ulcerations but again for the most part these have filled in. This does look almost to be more of a vasculitis type scenario based on what I am seeing. Patient does have chronic  venous insufficiency but is not wearing any compression even though it sounds like he has been told before he should. 5/27; patient with small wounds on his right medial ankle. He has chronic venous insufficiency and stasis dermatitis a large open wound and several smaller areas all of which  looks better than on presentation last week according to her nursing staff 01/26/2021 upon evaluation today patient appears to be doing well with regard to his wound. He is making good progress. With that being said there is some need currently for sharp debridement in regard to the wound there is some necrotic debris it is almost completely cleaned off but not completely. He is in agreement with that plan today. Fortunately there does not appear to be any evidence of active infection which is great and I am very pleased in that regard. He still wishes he did not have to wear the compression wrap. He did get his compression socks from elastic therapy though they are in the 15 to 20 mmHg range they did not have any his length as he is a tall guy in the 20 to 30 mmHg range. 02/02/2021 upon evaluation today patient appears to be doing excellent in regard to his leg ulcer. He has been tolerating the dressing changes without complication. Fortunately there is no evidence of active infection at this time. No fevers, chills, nausea, vomiting, or diarrhea. 02/09/2021 upon evaluation today patient's wound actually showing signs of improvement. Fortunately there does not appear to be any evidence of active infection which is great news overall very pleased with where things stand today. 02/16/2021 upon evaluation today patient's wound is showing some signs of improvement. He did have a fairly aggressive debridement last week he notes that he had quite a bit of discomfort he was some swelling following. With that being said I think this was to be expected considering what we had to do from a debridement standpoint. The good news  is there does not appear to be any evidence of infection currently nonetheless I do believe that the patient may have some signs of vasculitis here. Based on the overall appearance of the wound today and beginning to suspect this. For that reason I think he would benefit from starting him on triamcinolone ointment to the wound bed and some of the immediate periwound to try to help out in this regard. 02/23/2021 upon evaluation today patient appears to be doing well with regard to his wound. He is actually making some progress here. The wound is roughly about the same size but the overall appearance of the wound bed is much improved. I do not see any signs of active infection at this time which is great news. No fevers, chills, nausea, vomiting, or diarrhea. 03/02/2021 upon evaluation today patient appears to be doing well with regard to his wound. I think the biggest issue I see right now is that with Hydrofera Blue this wound is staying somewhat dry. I think he could benefit from the use of collagen to try to help with getting this to stimulate some additional growth. He is in agreement with the plan. I think this also had a little bit more moisture which would be helpful at this point. 03/09/2021 on evaluation today patient appears to be doing about the same in regard to his wound is measuring a little bit smaller but still he does have the surface of the wound which is not quite as healthy as I would like to see. Fortunately there does not appear to be any active infection at this time. No fevers, chills, nausea, vomiting, or diarrhea. 03/16/2021 upon evaluation today patient appears to be doing better with regard to the overall appearance of his wound bed. There does not appear to be any signs of significant slough buildup I think  the Iodoflex has done very well for him. The patient's happy to hear this and see the improvement. With that being said he is continuing to have a little bit of expansion of  the wound again I think this is more related to #1 edema control and #2 the fact that again we will get the wound bed clean. I feel like were pretty much today as far as cleaning up the wound bed is concerned now we just need to make sure the edema is optimized. I am just not certain that his compression socks are doing the job. Electronic Signature(s) Signed: 03/16/2021 6:38:37 PM By: Worthy Keeler PA-C Entered By: Worthy Keeler on 03/16/2021 18:38:36 [place], [place] ([id number]) -------------------------------------------------------------------------------- Physical Exam Details Patient Name: [name], [name] Date of Service: 03/16/2021 9:00 AM Medical Record Number: [id number] Patient Account Number: [id number] Date of Birth/Sex: May 20, 1971 (50 y.o. M) Treating RN: Carlene Coria Primary Care Provider: PATIENT, NO Other Clinician: Referring Provider: Barkley Boards Treating Provider/Extender: Skipper Cliche in Treatment: 9 Constitutional Well-nourished and well-hydrated in no acute distress. Respiratory normal breathing without difficulty. Psychiatric this patient is able to make decisions and demonstrates good insight into disease process. Alert and Oriented x 3. pleasant and cooperative. Notes Upon inspection patient's wound bed actually showed signs of good granulation epithelization at this time. I do think however his compression therapy is leaving something to be desired here. I do not think the compression socks he currently has are really doing the job and I think we need to compression wrapping. Electronic Signature(s) Signed: 03/16/2021 6:38:55 PM By: Worthy Keeler PA-C Entered By: Worthy Keeler on 03/16/2021 18:38:54 [name] ([id number]) -------------------------------------------------------------------------------- Physician Orders Details Patient Name: [name], [name] Date of Service: 03/16/2021 9:00 AM Medical Record Number: [id number] Patient Account  Number: [id number] Date of Birth/Sex: 1970-11-21 (50 y.o. M) Treating RN: Carlene Coria Primary Care Provider: PATIENT, NO Other Clinician: Referring Provider: Barkley Boards Treating Provider/Extender: Eduard Roux in Treatment: 9 Verbal / Phone Orders: No Diagnosis Coding ICD-10 Coding Code Description I87.2 Venous insufficiency (chronic) (peripheral) L97.812 Non-pressure chronic ulcer of other part of right lower leg with fat layer exposed Follow-up Appointments o Return Appointment in 1 week. Bathing/ Shower/ Hygiene o May shower with wound dressing protected with water repellent cover or cast protector. Edema Control - Lymphedema / Segmental Compressive Device / Other o Optional: One layer of unna paste to top of compression wrap (to act as an anchor). o Patient to wear own compression stockings. Remove compression stockings every night before going to bed and put on every morning when getting up. - right and left lower leg o Elevate, Exercise Daily and Avoid Standing for Long Periods of Time. o Elevate legs to the level of the heart and pump ankles as often as possible o Elevate leg(s) parallel to the floor when sitting. Wound Treatment Wound #1 - Lower Leg Wound Laterality: Right, Medial Cleanser: Soap and Water Every Other Day/30 Days Discharge Instructions: Gently cleanse wound with antibacterial soap, rinse and pat dry prior to dressing wounds Peri-Wound Care: Triamcinolone Acetonide Cream, 0.1%, 15 (g) tube Every Other Day/30 Days Discharge Instructions: apply to peri area and wound bed Primary Dressing: IODOFLEX 0.9% Cadexomer Iodine Pad Every Other Day/30 Days Discharge Instructions: Apply Iodoflex to wound bed only as directed. Secondary Dressing: ABD Pad 5x9 (in/in) Every Other Day/30 Days Discharge Instructions: Cover with ABD pad Compression Wrap: Medichoice 4 layer Compression System, 35-40 mmHG Every Other Day/30 Days  Discharge Instructions: Apply  multi-layer wrap as directed. Electronic Signature(s) Signed: 03/23/2021 4:33:49 PM By: Carlene Coria RN Entered By: Carlene Coria on 03/16/2021 [place], [place] Carlean Jews ([id number]) -------------------------------------------------------------------------------- Problem List Details Patient Name: [name], [name] Date of Service: 03/16/2021 9:00 AM Medical Record Number: [id number] Patient Account Number: [id number] Date of Birth/Sex: 05/26/1971 (50 y.o. M) Treating RN: Primary Care Provider: PATIENT, NO Other Clinician: Referring Provider: Barkley Boards Treating Provider/Extender: Eduard Roux in Treatment: 9 Active Problems ICD-10 Encounter Code Description Active Date MDM Diagnosis I87.2 Venous insufficiency (chronic) (peripheral) 01/12/2021 No Yes L97.812 Non-pressure chronic ulcer of other part of right lower leg with fat layer 01/12/2021 No Yes exposed Inactive Problems Resolved Problems Electronic Signature(s) Signed: 03/16/2021 9:29:23 AM By: Worthy Keeler PA-C Entered By: Worthy Keeler on 03/16/2021 09:29:23 [name], [name] ([id number]) -------------------------------------------------------------------------------- Progress Note Details Patient Name: Nicolas Cousin L. Date of Service: 03/16/2021 9:00 AM Medical Record Number: [id number] Patient Account Number: [id number] Date of Birth/Sex: [birth year] (50 y.o. M) Treating RN: Carlene Coria Primary Care Provider: PATIENT, NO Other Clinician: Referring Provider: Barkley Boards Treating Provider/Extender: Skipper Cliche in Treatment: 9 Subjective Chief Complaint Information obtained from Patient Right LE Ulcer History of Present Illness (HPI) 01/12/2021 upon evaluation today patient appears to be doing somewhat poorly in regard to a wound on his right medial lower leg. Currently he tells me that this has been going on since around the beginning of April. He has done a telehealth visit with a physician who initially  gave him a triamcinolone cream along with Keflex for 5 days. Subsequently he ended up being seen at walk-in urgent care where they also given Keflex for 7 days that seem to be doing better for him. Overall he tells me that things have improved from the standpoint of redness. Fortunately there does not appear to be any signs of systemic infection to be honest right now even locally I do not see any evidence of infection right now. He does have 1 main wound with some scattering areas that look like they may have been small ulcerations but again for the most part these have filled in. This does look almost to be more of a vasculitis type scenario based on what I am seeing. Patient does have chronic venous insufficiency but is not wearing any compression even though it sounds like he has been told before he should. 5/27; patient with small wounds on his right medial ankle. He has chronic venous insufficiency and stasis dermatitis a large open wound and several smaller areas all of which looks better than on presentation last week according to her nursing staff 01/26/2021 upon evaluation today patient appears to be doing well with regard to his wound. He is making good progress. With that being said there is some need currently for sharp debridement in regard to the wound there is some necrotic debris it is almost completely cleaned off but not completely. He is in agreement with that plan today. Fortunately there does not appear to be any evidence of active infection which is great and I am very pleased in that regard. He still wishes he did not have to wear the compression wrap. He did get his compression socks from elastic therapy though they are in the 15 to 20 mmHg range they did not have any his length as he is a tall guy in the 20 to 30 mmHg range. 02/02/2021 upon evaluation today patient appears to be doing excellent in regard to his leg  ulcer. He has been tolerating the dressing changes without  complication. Fortunately there is no evidence of active infection at this time. No fevers, chills, nausea, vomiting, or diarrhea. 02/09/2021 upon evaluation today patient's wound actually showing signs of improvement. Fortunately there does not appear to be any evidence of active infection which is great news overall very pleased with where things stand today. 02/16/2021 upon evaluation today patient's wound is showing some signs of improvement. He did have a fairly aggressive debridement last week he notes that he had quite a bit of discomfort he was some swelling following. With that being said I think this was to be expected considering what we had to do from a debridement standpoint. The good news is there does not appear to be any evidence of infection currently nonetheless I do believe that the patient may have some signs of vasculitis here. Based on the overall appearance of the wound today and beginning to suspect this. For that reason I think he would benefit from starting him on triamcinolone ointment to the wound bed and some of the immediate periwound to try to help out in this regard. 02/23/2021 upon evaluation today patient appears to be doing well with regard to his wound. He is actually making some progress here. The wound is roughly about the same size but the overall appearance of the wound bed is much improved. I do not see any signs of active infection at this time which is great news. No fevers, chills, nausea, vomiting, or diarrhea. 03/02/2021 upon evaluation today patient appears to be doing well with regard to his wound. I think the biggest issue I see right now is that with Hydrofera Blue this wound is staying somewhat dry. I think he could benefit from the use of collagen to try to help with getting this to stimulate some additional growth. He is in agreement with the plan. I think this also had a little bit more moisture which would be helpful at this point. 03/09/2021 on  evaluation today patient appears to be doing about the same in regard to his wound is measuring a little bit smaller but still he does have the surface of the wound which is not quite as healthy as I would like to see. Fortunately there does not appear to be any active infection at this time. No fevers, chills, nausea, vomiting, or diarrhea. 03/16/2021 upon evaluation today patient appears to be doing better with regard to the overall appearance of his wound bed. There does not appear to be any signs of significant slough buildup I think the Iodoflex has done very well for him. The patient's happy to hear this and see the improvement. With that being said he is continuing to have a little bit of expansion of the wound again I think this is more related to #1 edema control and #2 the fact that again we will get the wound bed clean. I feel like were pretty much today as far as cleaning up the wound bed is concerned now we just need to make sure the edema is optimized. I am just not certain that his compression socks are doing the job. Objective Cimino, Gannon L. ([id number]) Constitutional Well-nourished and well-hydrated in no acute distress. Vitals Time Taken: 9:21 AM, Height: 78 in, Weight: 249 lbs, BMI: 28.8, Temperature: 98.6 F, Pulse: 68 bpm, Respiratory Rate: 18 breaths/min, Blood Pressure: 142/70 mmHg. Respiratory normal breathing without difficulty. Psychiatric this patient is able to make decisions and demonstrates good insight into disease  process. Alert and Oriented x 3. pleasant and cooperative. General Notes: Upon inspection patient's wound bed actually showed signs of good granulation epithelization at this time. I do think however his compression therapy is leaving something to be desired here. I do not think the compression socks he currently has are really doing the job and I think we need to compression wrapping. Integumentary (Hair, Skin) Wound #1 status is Open. Original cause  of wound was Gradually Appeared. The date acquired was: 11/24/2020. The wound has been in treatment 9 weeks. The wound is located on the Right,Medial Lower Leg. The wound measures 1.1cm length x 1.1cm width x 0.3cm depth; 0.95cm^2 area and 0.285cm^3 volume. There is Fat Layer (Subcutaneous Tissue) exposed. There is no tunneling or undermining noted. There is a medium amount of serosanguineous drainage noted. The wound margin is flat and intact. There is small (1-33%) red, pink granulation within the wound bed. There is a large (67-100%) amount of necrotic tissue within the wound bed including Adherent Slough. Assessment Active Problems ICD-10 Venous insufficiency (chronic) (peripheral) Non-pressure chronic ulcer of other part of right lower leg with fat layer exposed Procedures Wound #1 Pre-procedure diagnosis of Wound #1 is a Venous Leg Ulcer located on the Right,Medial Lower Leg .Severity of Tissue Pre Debridement is: Fat layer exposed. There was a Excisional Skin/Subcutaneous Tissue Debridement with a total area of 1.21 sq cm performed by STONE, ABIGAIL. With the following instrument(s): Curette Material removed includes Subcutaneous Tissue, Skin: Dermis, and Skin: Epidermis after achieving pain control using Lidocaine 4% Topical Solution. No specimens were taken. A time out was conducted at 09:34, prior to the start of the procedure. A Moderate amount of bleeding was controlled with Pressure. The procedure was tolerated well with a pain level of 0 throughout and a pain level of 0 following the procedure. Post Debridement Measurements: 1.1cm length x 1.1cm width x 0.3cm depth; 0.285cm^3 volume. Character of Wound/Ulcer Post Debridement is improved. Severity of Tissue Post Debridement is: Fat layer exposed. Post procedure Diagnosis Wound #1: Same as Pre-Procedure Pre-procedure diagnosis of Wound #1 is a Venous Leg Ulcer located on the Right,Medial Lower Leg . There was a Four Layer Compression  Therapy Procedure by Carlene Coria, RN. Post procedure Diagnosis Wound #1: Same as Pre-Procedure Plan Follow-up Appointments: Return Appointment in 1 week. Bathing/ Shower/ Hygiene: May shower with wound dressing protected with water repellent cover or cast protector. Edema Control - Lymphedema / Segmental Compressive Device / Other: Optional: One layer of unna paste to top of compression wrap (to act as an anchor). Patient to wear own compression stockings. Remove compression stockings every night before going to bed and put on every morning when getting up. - right and left lower leg Elevate, Exercise Daily and Avoid Standing for Long Periods of Time. [name], [name]) Elevate legs to the level of the heart and pump ankles as often as possible Elevate leg(s) parallel to the floor when sitting. WOUND #1: - Lower Leg Wound Laterality: Right, Medial Cleanser: Soap and Water Every Other Day/30 Days Discharge Instructions: Gently cleanse wound with antibacterial soap, rinse and pat dry prior to dressing wounds Peri-Wound Care: Triamcinolone Acetonide Cream, 0.1%, 15 (g) tube Every Other Day/30 Days Discharge Instructions: apply to peri area and wound bed Primary Dressing: IODOFLEX 0.9% Cadexomer Iodine Pad Every Other Day/30 Days Discharge Instructions: Apply Iodoflex to wound bed only as directed. Secondary Dressing: ABD Pad 5x9 (in/in) Every Other Day/30 Days Discharge Instructions: Cover with ABD pad Compression  Wrap: Medichoice 4 layer Compression System, 35-40 mmHG Every Other Day/30 Days Discharge Instructions: Apply multi-layer wrap as directed. 1. Would recommend currently that we go ahead and initiate a reinitiation of the 4-layer compression wrap which I think is good to do a much better job of controlling the edema and helping him to heal. 2. I am also can recommend that we have the patient continue with the Iodoflex as that seems to be doing great as well. 3. I am  also can recommend that we have the patient go ahead and continue with the ABD pad to cover and overall I am hopeful that we will see a significant improvement next week. We will see patient back for reevaluation in 1 week here in the clinic. If anything worsens or changes patient will contact our office for additional recommendations. Electronic Signature(s) Signed: 03/16/2021 6:39:24 PM By: Worthy Keeler PA-C Entered By: Worthy Keeler on 03/16/2021 18:39:23 [place], [place] [name] Kitchen ([id number]) -------------------------------------------------------------------------------- SuperBill Details Patient Name: Nicolas Cousin L. Date of Service: 03/16/2021 Medical Record Number: [id number] Patient Account Number: [id number] Date of Birth/Sex: [birth year] (50 y.o. M) Treating RN: Carlene Coria Primary Care Provider: PATIENT, NO Other Clinician: Referring Provider: Barkley Boards Treating Provider/Extender: Skipper Cliche in Treatment: 9 Diagnosis Coding ICD-10 Codes Code Description I87.2 Venous insufficiency (chronic) (peripheral) L97.812 Non-pressure chronic ulcer of other part of right lower leg with fat layer exposed Facility Procedures CPT4 Code: [id number] Description: [id number] - DEB SUBQ TISSUE 20 SQ CM/< Modifier: Quantity: 1 CPT4 Code: Description: ICD-10 Diagnosis Description [id number] Non-pressure chronic ulcer of other part of right lower leg with fat lay Modifier: er exposed Quantity: Physician Procedures CPT4 Code: [id number] Description: [place] - WC PHYS SUBQ TISS 20 SQ CM Modifier: Quantity: 1 CPT4 Code: Description: ICD-10 Diagnosis Description [id number] Non-pressure chronic ulcer of other part of right lower leg with fat lay Modifier: er exposed Quantity: Electronic Signature(s) Signed: 03/16/2021 6:42:07 PM By: Worthy Keeler PA-C Entered By: Worthy Keeler on 03/16/2021 18:42:07

## 2021-03-23 ENCOUNTER — Encounter: Payer: Self-pay | Admitting: Physician Assistant

## 2021-03-23 ENCOUNTER — Other Ambulatory Visit: Payer: Self-pay

## 2021-03-23 NOTE — Progress Notes (Addendum)
TAYLIN, BOSSEN ([id number]) Visit Report for 03/23/2021 Chief Complaint Document Details Patient Name: [name], [name]. Date of Service: 03/23/2021 9:00 AM Medical Record Number: [id number] Patient Account Number: [id number] Date of Birth/Sex: 24-Feb-1971 (50 y.o. M) Treating RN: Primary Care Provider: PATIENT, NO Other Clinician: Referring Provider: Barkley Boards Treating Provider/Extender: Skipper Cliche in Treatment: 10 Information Obtained from: Patient Chief Complaint Right LE Ulcer Electronic Signature(s) Signed: 03/23/2021 9:21:44 AM By: Worthy Keeler PA-C Entered By: Worthy Keeler on 03/23/2021 09:21:44 [name], [name] ([id number]) -------------------------------------------------------------------------------- HPI Details Patient Name: Nicolas Cousin L. Date of Service: 03/23/2021 9:00 AM Medical Record Number: [id number] Patient Account Number: [id number] Date of Birth/Sex: September 28, 1970 (50 y.o. M) Treating RN: Primary Care Provider: PATIENT, NO Other Clinician: Referring Provider: Barkley Boards Treating Provider/Extender: Skipper Cliche in Treatment: 10 History of Present Illness HPI Description: 01/12/2021 upon evaluation today patient appears to be doing somewhat poorly in regard to a wound on his right medial lower leg. Currently he tells me that this has been going on since around the beginning of April. He has done a telehealth visit with a physician who initially gave him a triamcinolone cream along with Keflex for 5 days. Subsequently he ended up being seen at walk-in urgent care where they also given Keflex for 7 days that seem to be doing better for him. Overall he tells me that things have improved from the standpoint of redness. Fortunately there does not appear to be any signs of systemic infection to be honest right now even locally I do not [name] any evidence of infection right now. He does have 1 main wound with some scattering areas that look like they may have  been small ulcerations but again for the most part these have filled in. This does look almost to be more of a vasculitis type scenario based on what I am seeing. Patient does have chronic venous insufficiency but is not wearing any compression even though it sounds like he has been told before he should. 5/27; patient with small wounds on his right medial ankle. He has chronic venous insufficiency and stasis dermatitis a large open wound and several smaller areas all of which looks better than on presentation last week according to her nursing staff 01/26/2021 upon evaluation today patient appears to be doing well with regard to his wound. He is making good progress. With that being said there is some need currently for sharp debridement in regard to the wound there is some necrotic debris it is almost completely cleaned off but not completely. He is in agreement with that plan today. Fortunately there does not appear to be any evidence of active infection which is great and I am very pleased in that regard. He still wishes he did not have to wear the compression wrap. He did get his compression socks from [facility] therapy though they are in the 15 to 20 mmHg range they did not have any his length as he is a tall guy in the 20 to 30 mmHg range. 02/02/2021 upon evaluation today patient appears to be doing excellent in regard to his leg ulcer. He has been tolerating the dressing changes without complication. Fortunately there is no evidence of active infection at this time. No fevers, chills, nausea, vomiting, or diarrhea. 02/09/2021 upon evaluation today patient's wound actually showing signs of improvement. Fortunately there does not appear to be any evidence of active infection which is great news overall very pleased with where things stand today. 02/16/2021  upon evaluation today patient's wound is showing some signs of improvement. He did have a fairly aggressive debridement last week he notes that he  had quite a bit of discomfort he was some swelling following. With that being said I think this was to be expected considering what we had to do from a debridement standpoint. The good news is there does not appear to be any evidence of infection currently nonetheless I do believe that the patient may have some signs of vasculitis here. Based on the overall appearance of the wound today and beginning to suspect this. For that reason I think he would benefit from starting him on triamcinolone ointment to the wound bed and some of the immediate periwound to try to help out in this regard. 02/23/2021 upon evaluation today patient appears to be doing well with regard to his wound. He is actually making some progress here. The wound is roughly about the same size but the overall appearance of the wound bed is much improved. I do not [name] any signs of active infection at this time which is great news. No fevers, chills, nausea, vomiting, or diarrhea. 03/02/2021 upon evaluation today patient appears to be doing well with regard to his wound. I think the biggest issue I [name] right now is that with Hydrofera Blue this wound is staying somewhat dry. I think he could benefit from the use of collagen to try to help with getting this to stimulate some additional growth. He is in agreement with the plan. I think this also had a little bit more moisture which would be helpful at this point. 03/09/2021 on evaluation today patient appears to be doing about the same in regard to his wound is measuring a little bit smaller but still he does have the surface of the wound which is not quite as healthy as I would like to [name]. Fortunately there does not appear to be any active infection at this time. No fevers, chills, nausea, vomiting, or diarrhea. 03/16/2021 upon evaluation today patient appears to be doing better with regard to the overall appearance of his wound bed. There does not appear to be any signs of significant slough  buildup I think the Iodoflex has done very well for him. The patient's happy to hear this and [name] the improvement. With that being said he is continuing to have a little bit of expansion of the wound again I think this is more related to #1 edema control and #2 the fact that again we will get the wound bed clean. I feel like were pretty much today as far as cleaning up the wound bed is concerned now we just need to make sure the edema is optimized. I am just not certain that his compression socks are doing the job. 03/23/2021 upon evaluation today patient unfortunately appears to be doing a little bit worse with regard to his wound I was actually expecting this to be much better this week. With that being said I am getting concerned about the possibility of pyoderma gangrenosum. I did print off some information for the patient in this regard. Subsequently I am also can [name] about starting him on some steroid cream as well as an oral steroid. Electronic Signature(s) Signed: 03/23/2021 9:42:16 AM By: Worthy Keeler PA-C Entered By: Worthy Keeler on 03/23/2021 09:42:16 [name], [name] ([id number]) -------------------------------------------------------------------------------- Physical Exam Details Patient Name: ABAAN, SCHURMAN L. Date of Service: 03/23/2021 9:00 AM Medical Record Number: [id number] Patient Account Number: [id number] Date of  Birth/Sex: [birth year] (50 y.o. M) Treating RN: Primary Care Provider: PATIENT, NO Other Clinician: Referring Provider: Barkley Boards Treating Provider/Extender: Skipper Cliche in Treatment: 60 Constitutional Well-nourished and well-hydrated in no acute distress. Respiratory normal breathing without difficulty. Psychiatric this patient is able to make decisions and demonstrates good insight into disease process. Alert and Oriented x 3. pleasant and cooperative. Notes Patient's wound did not require any sharp debridement today it actually appears to be doing  healthy although unfortunately this seems to want to spread a little bit along the border. I am concerned based on what I am seeing a pyoderma gangrenosum I [name] about getting started on topical steroids that are bit stronger today. Electronic Signature(s) Signed: 03/23/2021 9:42:33 AM By: Worthy Keeler PA-C Entered By: Worthy Keeler on 03/23/2021 09:42:33 [name] ([id number]) -------------------------------------------------------------------------------- Physician Orders Details Patient Name: [name], [name] Date of Service: 03/23/2021 9:00 AM Medical Record Number: [id number] Patient Account Number: [id number] Date of Birth/Sex: [birth year] (50 y.o. M) Treating RN: Carlene Coria Primary Care Provider: PATIENT, NO Other Clinician: Referring Provider: Barkley Boards Treating Provider/Extender: Skipper Cliche in Treatment: 10 Verbal / Phone Orders: No Diagnosis Coding ICD-10 Coding Code Description I87.2 Venous insufficiency (chronic) (peripheral) L97.812 Non-pressure chronic ulcer of other part of right lower leg with fat layer exposed Follow-up Appointments o Return Appointment in 1 week. Bathing/ Shower/ Hygiene o May shower with wound dressing protected with water repellent cover or cast protector. Edema Control - Lymphedema / Segmental Compressive Device / Other o Optional: One layer of unna paste to top of compression wrap (to act as an anchor). o Patient to wear own compression stockings. Remove compression stockings every night before going to bed and put on every morning when getting up. - right and left lower leg o Elevate, Exercise Daily and Avoid Standing for Long Periods of Time. o Elevate legs to the level of the heart and pump ankles as often as possible o Elevate leg(s) parallel to the floor when sitting. Wound Treatment Wound #1 - Lower Leg Wound Laterality: Right, Medial Cleanser: Soap and Water 1 x Per Day/30 Days Discharge  Instructions: Gently cleanse wound with antibacterial soap, rinse and pat dry prior to dressing wounds Peri-Wound Care: Triamcinolone Acetonide Cream, 0.1%, 15 (g) tube 1 x Per Day/30 Days Discharge Instructions: in wound clinic Peri-Wound Care: Clobetasol 1 x Per Day/30 Days Discharge Instructions: to peri wound and wound base Primary Dressing: Hydrofera Blue Ready Transfer Foam, 2.5x2.5 (in/in) 1 x Per Day/30 Days Discharge Instructions: Apply Hydrofera Blue Ready to wound bed as directed Secondary Dressing: Bordered Gauze Sterile-HBD 4x4 (in/in) 1 x Per Day/30 Days Discharge Instructions: Cover wound with Bordered Guaze Sterile as directed Patient Medications Allergies: No Known Drug Allergies Notifications Medication Indication Start End prednisone 03/23/2021 DOSE 1 - oral 50 mg tablet - 1 tablet oral taken 1 time per day with food in the morning or at lunch x 7 days clobetasol 03/23/2021 DOSE topical 0.05 % cream - cream topical applied in a thin film to the wound bed and the surrounding skin daily with each dressing change as directed in the clinic. Electronic Signature(s) Signed: 03/23/2021 9:45:04 AM By: Worthy Keeler PA-C Entered By: Worthy Keeler on 03/23/2021 09:45:04 [name], [name] ([id number]) Cletus Gash, [place] ([id number]) -------------------------------------------------------------------------------- Problem List Details Patient Name: OLAV, ZAWADA L. Date of Service: 03/23/2021 9:00 AM Medical Record Number: [id number] Patient Account Number: [id number] Date of Birth/Sex: 03/10/1971 (50 y.o. M) Treating RN: Primary Care  Provider: PATIENT, NO Other Clinician: Referring Provider: Barkley Boards Treating Provider/Extender: Skipper Cliche in Treatment: 10 Active Problems ICD-10 Encounter Code Description Active Date MDM Diagnosis I87.2 Venous insufficiency (chronic) (peripheral) 01/12/2021 No Yes L97.812 Non-pressure chronic ulcer of other part of right lower leg with  fat layer 01/12/2021 No Yes exposed Inactive Problems Resolved Problems Electronic Signature(s) Signed: 03/23/2021 9:21:38 AM By: Worthy Keeler PA-C Entered By: Worthy Keeler on 03/23/2021 09:21:37 [name], [name] ([id number]) -------------------------------------------------------------------------------- Progress Note Details Patient Name: Nicolas Cousin L. Date of Service: 03/23/2021 9:00 AM Medical Record Number: [id number] Patient Account Number: [id number] Date of Birth/Sex: 09-28-1970 (50 y.o. M) Treating RN: Primary Care Provider: PATIENT, NO Other Clinician: Referring Provider: Barkley Boards Treating Provider/Extender: Skipper Cliche in Treatment: 10 Subjective Chief Complaint Information obtained from Patient Right LE Ulcer History of Present Illness (HPI) 01/12/2021 upon evaluation today patient appears to be doing somewhat poorly in regard to a wound on his right medial lower leg. Currently he tells me that this has been going on since around the beginning of April. He has done a telehealth visit with a physician who initially gave him a triamcinolone cream along with Keflex for 5 days. Subsequently he ended up being seen at walk-in urgent care where they also given Keflex for 7 days that seem to be doing better for him. Overall he tells me that things have improved from the standpoint of redness. Fortunately there does not appear to be any signs of systemic infection to be honest right now even locally I do not [name] any evidence of infection right now. He does have 1 main wound with some scattering areas that look like they may have been small ulcerations but again for the most part these have filled in. This does look almost to be more of a vasculitis type scenario based on what I am seeing. Patient does have chronic venous insufficiency but is not wearing any compression even though it sounds like he has been told before he should. 5/27; patient with small wounds on his  right medial ankle. He has chronic venous insufficiency and stasis dermatitis a large open wound and several smaller areas all of which looks better than on presentation last week according to her nursing staff 01/26/2021 upon evaluation today patient appears to be doing well with regard to his wound. He is making good progress. With that being said there is some need currently for sharp debridement in regard to the wound there is some necrotic debris it is almost completely cleaned off but not completely. He is in agreement with that plan today. Fortunately there does not appear to be any evidence of active infection which is great and I am very pleased in that regard. He still wishes he did not have to wear the compression wrap. He did get his compression socks from [facility] therapy though they are in the 15 to 20 mmHg range they did not have any his length as he is a tall guy in the 20 to 30 mmHg range. 02/02/2021 upon evaluation today patient appears to be doing excellent in regard to his leg ulcer. He has been tolerating the dressing changes without complication. Fortunately there is no evidence of active infection at this time. No fevers, chills, nausea, vomiting, or diarrhea. 02/09/2021 upon evaluation today patient's wound actually showing signs of improvement. Fortunately there does not appear to be any evidence of active infection which is great news overall very pleased with where things stand today. 02/16/2021  upon evaluation today patient's wound is showing some signs of improvement. He did have a fairly aggressive debridement last week he notes that he had quite a bit of discomfort he was some swelling following. With that being said I think this was to be expected considering what we had to do from a debridement standpoint. The good news is there does not appear to be any evidence of infection currently nonetheless I do believe that the patient may have some signs of vasculitis here. Based on  the overall appearance of the wound today and beginning to suspect this. For that reason I think he would benefit from starting him on triamcinolone ointment to the wound bed and some of the immediate periwound to try to help out in this regard. 02/23/2021 upon evaluation today patient appears to be doing well with regard to his wound. He is actually making some progress here. The wound is roughly about the same size but the overall appearance of the wound bed is much improved. I do not [name] any signs of active infection at this time which is great news. No fevers, chills, nausea, vomiting, or diarrhea. 03/02/2021 upon evaluation today patient appears to be doing well with regard to his wound. I think the biggest issue I [name] right now is that with Hydrofera Blue this wound is staying somewhat dry. I think he could benefit from the use of collagen to try to help with getting this to stimulate some additional growth. He is in agreement with the plan. I think this also had a little bit more moisture which would be helpful at this point. 03/09/2021 on evaluation today patient appears to be doing about the same in regard to his wound is measuring a little bit smaller but still he does have the surface of the wound which is not quite as healthy as I would like to [name]. Fortunately there does not appear to be any active infection at this time. No fevers, chills, nausea, vomiting, or diarrhea. 03/16/2021 upon evaluation today patient appears to be doing better with regard to the overall appearance of his wound bed. There does not appear to be any signs of significant slough buildup I think the Iodoflex has done very well for him. The patient's happy to hear this and [name] the improvement. With that being said he is continuing to have a little bit of expansion of the wound again I think this is more related to #1 edema control and #2 the fact that again we will get the wound bed clean. I feel like were pretty much today  as far as cleaning up the wound bed is concerned now we just need to make sure the edema is optimized. I am just not certain that his compression socks are doing the job. 03/23/2021 upon evaluation today patient unfortunately appears to be doing a little bit worse with regard to his wound I was actually expecting this to be much better this week. With that being said I am getting concerned about the possibility of pyoderma gangrenosum. I did print off some information for the patient in this regard. Subsequently I am also can [name] about starting him on some steroid cream as well as an oral steroid. [name], [name] ([id number]) Objective Constitutional Well-nourished and well-hydrated in no acute distress. Vitals Time Taken: 9:11 AM, Height: 78 in, Weight: 249 lbs, BMI: 28.8, Temperature: 98.2 F, Pulse: 64 bpm, Respiratory Rate: 18 breaths/min, Blood Pressure: 138/80 mmHg. Respiratory normal breathing without difficulty. Psychiatric this patient  is able to make decisions and demonstrates good insight into disease process. Alert and Oriented x 3. pleasant and cooperative. General Notes: Patient's wound did not require any sharp debridement today it actually appears to be doing healthy although unfortunately this seems to want to spread a little bit along the border. I am concerned based on what I am seeing a pyoderma gangrenosum I [name] about getting started on topical steroids that are bit stronger today. Integumentary (Hair, Skin) Wound #1 status is Open. Original cause of wound was Gradually Appeared. The date acquired was: 11/24/2020. The wound has been in treatment 10 weeks. The wound is located on the Right,Medial Lower Leg. The wound measures 1.5cm length x 1.6cm width x 0.3cm depth; 1.885cm^2 area and 0.565cm^3 volume. There is Fat Layer (Subcutaneous Tissue) exposed. There is no tunneling or undermining noted. There is a medium amount of serosanguineous drainage noted. The wound margin  is flat and intact. There is small (1-33%) red, pink granulation within the wound bed. There is a large (67-100%) amount of necrotic tissue within the wound bed including Adherent Slough. Assessment Active Problems ICD-10 Venous insufficiency (chronic) (peripheral) Non-pressure chronic ulcer of other part of right lower leg with fat layer exposed Plan Follow-up Appointments: Return Appointment in 1 week. Bathing/ Shower/ Hygiene: May shower with wound dressing protected with water repellent cover or cast protector. Edema Control - Lymphedema / Segmental Compressive Device / Other: Optional: One layer of unna paste to top of compression wrap (to act as an anchor). Patient to wear own compression stockings. Remove compression stockings every night before going to bed and put on every morning when getting up. - right and left lower leg Elevate, Exercise Daily and Avoid Standing for Long Periods of Time. Elevate legs to the level of the heart and pump ankles as often as possible Elevate leg(s) parallel to the floor when sitting. The following medication(s) was prescribed: prednisone oral 50 mg tablet 1 1 tablet oral taken 1 time per day with food in the morning or at lunch x 7 days starting 03/23/2021 clobetasol topical 0.05 % cream cream topical applied in a thin film to the wound bed and the surrounding skin daily with each dressing change as directed in the clinic. starting 03/23/2021 WOUND #1: - Lower Leg Wound Laterality: Right, Medial Cleanser: Soap and Water 1 x Per Day/30 Days Discharge Instructions: Gently cleanse wound with antibacterial soap, rinse and pat dry prior to dressing wounds Peri-Wound Care: Triamcinolone Acetonide Cream, 0.1%, 15 (g) tube 1 x Per Day/30 Days Discharge Instructions: in wound clinic Peri-Wound Care: Clobetasol 1 x Per Day/30 Days Discharge Instructions: to peri wound and wound base Primary Dressing: Hydrofera Blue Ready Transfer Foam, 2.5x2.5 (in/in) 1 x  Per Day/30 Days Discharge Instructions: Apply Hydrofera Blue Ready to wound bed as directed Secondary Dressing: Bordered Gauze Sterile-HBD 4x4 (in/in) 1 x Per Day/30 Days Discharge Instructions: Cover wound with Bordered Guaze Sterile as directed [name], [name] ([id number]) 1. I did go ahead and send this to some full prednisone oral for seven days along with clobetasol topical things to be applied to the wound bed thin-film. He said changes. 2. I will monitor closely for responses next week hopefully things will show signs of improvement. 3. 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 Please [name] above for specific wound care orders. We will [name] patient for re-evaluation  in 1 week(s) here in the clinic. If anything worsens or changes patient will contact our office for additional recommendations. Electronic Signature(s) Signed: 03/25/2021 9:09:56 PM By: Worthy Keeler PA-C Entered By: Worthy Keeler on 03/25/2021 21:08:56 [name] ([id number]) -------------------------------------------------------------------------------- SuperBill Details Patient Name: Nicolas Cousin L. Date of Service: 03/23/2021 Medical Record Number: [id number] Patient Account Number: [id number] Date of Birth/Sex: June 10, 1971 (50 y.o. M) Treating RN: Carlene Coria Primary Care Provider: PATIENT, NO Other Clinician: Referring Provider: Barkley Boards Treating Provider/Extender: Skipper Cliche in Treatment: 10 Diagnosis Coding ICD-10 Codes Code Description I87.2 Venous insufficiency (chronic) (peripheral) L97.812 Non-pressure chronic ulcer of other part of right lower leg with fat layer exposed Facility Procedures CPT4 Code: [id number] Description: [phone] - WOUND CARE VISIT-LEV 2 EST PT Modifier: Quantity: 1 Physician Procedures CPT4 Code: [id number] Description: [id number] - WC PHYS LEVEL 4 - EST  PT Modifier: Quantity: 1 CPT4 Code: Description: ICD-10 Diagnosis Description I87.2 Venous insufficiency (chronic) (peripheral) L97.812 Non-pressure chronic ulcer of other part of right lower leg with fat la Modifier: yer exposed Quantity: Electronic Signature(s) Signed: 03/25/2021 9:09:56 PM By: Worthy Keeler PA-C Previous Signature: 03/23/2021 4:21:57 PM Version By: Worthy Keeler PA-C Previous Signature: 03/23/2021 4:32:50 PM Version By: Carlene Coria RN Entered By: Worthy Keeler on 03/25/2021 21:09:30

## 2021-03-24 NOTE — Progress Notes (Signed)
MONIQUE, GIFT ([id number]) Visit Report for 03/23/2021 Arrival Information Details Patient Name: [name], [name]. Date of Service: 03/23/2021 9:00 AM Medical Record Number: [id number] Patient Account Number: [id number] Date of Birth/Sex: December 17, 1970 (50 y.o. M) Treating RN: Carlene Coria Primary Care Aubery Douthat: PATIENT, NO Other Clinician: Referring [place] Chagnon: Barkley Boards Treating Susannah Carbin/Extender: Skipper Cliche in Treatment: 10 Visit Information History Since Last Visit All ordered tests and consults were completed: No Patient Arrived: Ambulatory Added or deleted any medications: No Arrival Time: 09:07 Any new allergies or adverse reactions: No Accompanied By: self Had a fall or experienced change in No Transfer Assistance: None activities of daily living that may affect Patient Identification Verified: Yes risk of falls: Secondary Verification Process Completed: Yes Signs or symptoms of abuse/neglect since last visito No Patient Requires Transmission-Based Precautions: No Hospitalized since last visit: No Patient Has Alerts: Yes Implantable device outside of the clinic excluding No Patient Alerts: NOT DIABETIC cellular tissue based products placed in the center since last visit: Has Dressing in Place as Prescribed: Yes Has Compression in Place as Prescribed: Yes Pain Present Now: No Electronic Signature(s) Signed: 03/23/2021 4:32:50 PM By: Carlene Coria RN Entered By: Carlene Coria on 03/23/2021 [place], [place]. ([id number]) -------------------------------------------------------------------------------- Clinic Level of Care Assessment Details Patient Name: [name] Date of Service: 03/23/2021 9:00 AM Medical Record Number: [id number] Patient Account Number: [id number] Date of Birth/Sex: 1971-07-27 (50 y.o. M) Treating RN: Carlene Coria Primary Care Tameron Lama: PATIENT, NO Other Clinician: Referring Nyzaiah Kai: Barkley Boards Treating Milina Pagett/Extender: Skipper Cliche in Treatment: 10 Clinic Level of Care Assessment Items TOOL 4 Quantity Score X - Use when only an EandM is performed on FOLLOW-UP visit 1 0 ASSESSMENTS - Nursing Assessment / Reassessment []  - Reassessment of Co-morbidities (includes updates in patient status) 0 []  - 0 Reassessment of Adherence to Treatment Plan ASSESSMENTS - Wound and Skin Assessment / Reassessment X - Simple Wound Assessment / Reassessment - one wound 1 5 []  - 0 Complex Wound Assessment / Reassessment - multiple wounds []  - 0 Dermatologic / Skin Assessment (not related to wound area) ASSESSMENTS - Focused Assessment []  - Circumferential Edema Measurements - multi extremities 0 []  - 0 Nutritional Assessment / Counseling / Intervention []  - 0 Lower Extremity Assessment (monofilament, tuning fork, pulses) []  - 0 Peripheral Arterial Disease Assessment (using hand held doppler) ASSESSMENTS - Ostomy and/or Continence Assessment and Care []  - Incontinence Assessment and Management 0 []  - 0 Ostomy Care Assessment and Management (repouching, etc.) PROCESS - Coordination of Care X - Simple Patient / Family Education for ongoing care 1 15 []  - 0 Complex (extensive) Patient / Family Education for ongoing care []  - 0 Staff obtains Programmer, systems, Records, Test Results / Process Orders []  - 0 Staff telephones HHA, Nursing Homes / Clarify orders / etc []  - 0 Routine Transfer to another Facility (non-emergent condition) []  - 0 Routine Hospital Admission (non-emergent condition) []  - 0 New Admissions / Biomedical engineer / Ordering NPWT, Apligraf, etc. []  - 0 Emergency Hospital Admission (emergent condition) X- 1 10 Simple Discharge Coordination []  - 0 Complex (extensive) Discharge Coordination PROCESS - Special Needs []  - Pediatric / Minor Patient Management 0 []  - 0 Isolation Patient Management []  - 0 Hearing / Language / Visual special needs []  - 0 Assessment of Community assistance  (transportation, D/C planning, etc.) []  - 0 Additional assistance / Altered mentation []  - 0 Support Surface(s) Assessment (bed, cushion, seat, etc.) INTERVENTIONS - Wound Cleansing / Measurement Denk,  Hisashi L. ([id number]) X- 1 5 Simple Wound Cleansing - one wound []  - 0 Complex Wound Cleansing - multiple wounds X- 1 5 Wound Imaging (photographs - any number of wounds) []  - 0 Wound Tracing (instead of photographs) X- 1 5 Simple Wound Measurement - one wound []  - 0 Complex Wound Measurement - multiple wounds INTERVENTIONS - Wound Dressings X - Small Wound Dressing one or multiple wounds 1 10 []  - 0 Medium Wound Dressing one or multiple wounds []  - 0 Large Wound Dressing one or multiple wounds X- 1 5 Application of Medications - topical []  - 0 Application of Medications - injection INTERVENTIONS - Miscellaneous []  - External ear exam 0 []  - 0 Specimen Collection (cultures, biopsies, blood, body fluids, etc.) []  - 0 Specimen(s) / Culture(s) sent or taken to Lab for analysis []  - 0 Patient Transfer (multiple staff / Civil Service fast streamer / Similar devices) []  - 0 Simple Staple / Suture removal (25 or less) []  - 0 Complex Staple / Suture removal (26 or more) []  - 0 Hypo / Hyperglycemic Management (close monitor of Blood Glucose) []  - 0 Ankle / Brachial Index (ABI) - do not check if billed separately X- 1 5 Vital Signs Has the patient been seen at the hospital within the last three years: Yes Total Score: 65 Level Of Care: New/Established - Level 2 Electronic Signature(s) Signed: 03/23/2021 4:32:50 PM By: Carlene Coria RN Entered By: Carlene Coria on 03/23/2021 09:40:38 [place], [place]. ([id number]) -------------------------------------------------------------------------------- Encounter Discharge Information Details Patient Name: [name] Date of Service: 03/23/2021 9:00 AM Medical Record Number: [id number] Patient Account Number: [id number] Date of Birth/Sex:  [birth year] (50 y.o. M) Treating RN: Carlene Coria Primary Care Dashun Borre: PATIENT, NO Other Clinician: Referring Dawn Kiper: Barkley Boards Treating Loren Vicens/Extender: Skipper Cliche in Treatment: 10 Encounter Discharge Information Items Discharge Condition: Stable Ambulatory Status: Ambulatory Discharge Destination: Home Transportation: Private Auto Accompanied By: self Schedule Follow-up Appointment: Yes Clinical Summary of Care: Patient Declined Electronic Signature(s) Signed: 03/23/2021 4:32:50 PM By: Carlene Coria RN Entered By: Carlene Coria on 03/23/2021 09:43:04 [place], Sophia ([id number]) -------------------------------------------------------------------------------- Lower Extremity Assessment Details Patient Name: [name] Date of Service: 03/23/2021 9:00 AM Medical Record Number: [id number] Patient Account Number: [id number] Date of Birth/Sex: 1970-10-25 (50 y.o. M) Treating RN: Carlene Coria Primary Care Jasani Lengel: PATIENT, NO Other Clinician: Referring Zakayla Martinec: Barkley Boards Treating Brodrick Curran/Extender: Skipper Cliche in Treatment: 10 Edema Assessment Assessed: [Left: No] [Right: No] Edema: [Left: Ye] [Right: s] Calf Left: Right: Point of Measurement: 48 cm From Medial Instep 34 cm Ankle Left: Right: Point of Measurement: 10 cm From Medial Instep 24 cm Vascular Assessment Pulses: Dorsalis Pedis Palpable: [Right:Yes] Electronic Signature(s) Signed: 03/23/2021 4:32:50 PM By: Carlene Coria RN Entered By: Carlene Coria on 03/23/2021 09:18:38 [place], [place] ([id number]) -------------------------------------------------------------------------------- Multi Wound Chart Details Patient Name: [name] Date of Service: 03/23/2021 9:00 AM Medical Record Number: [id number] Patient Account Number: [id number] Date of Birth/Sex: 1971-02-22 (50 y.o. M) Treating RN: Carlene Coria Primary Care Lile Mccurley: PATIENT, NO Other Clinician: Referring Jupiter Kabir: Barkley Boards Treating Ryosuke Ericksen/Extender: Skipper Cliche in Treatment: 10 Vital Signs Height(in): 78 Pulse(bpm): 65 Weight(lbs): 249 Blood Pressure(mmHg): 138/80 Body Mass Index(BMI): 29 Temperature(F): 98.2 Respiratory Rate(breaths/min): 18 Photos: [N/A:N/A] Wound Location: Right, Medial Lower Leg N/A N/A Wounding Event: Gradually Appeared N/A N/A Primary Etiology: Venous Leg Ulcer N/A N/A Date Acquired: 11/24/2020 N/A N/A Weeks of Treatment: 10 N/A N/A Wound Status: Open N/A N/A Measurements L x W x D (cm)  1.5x1.6x0.3 N/A N/A Area (cm) : 1.885 N/A N/A Volume (cm) : 0.565 N/A N/A % Reduction in Area: -122.30% N/A N/A % Reduction in Volume: -232.40% N/A N/A Classification: Full Thickness Without Exposed N/A N/A Support Structures Exudate Amount: Medium N/A N/A Exudate Type: Serosanguineous N/A N/A Exudate Color: red, brown N/A N/A Wound Margin: Flat and Intact N/A N/A Granulation Amount: Small (1-33%) N/A N/A Granulation Quality: Red, Pink N/A N/A Necrotic Amount: Large (67-100%) N/A N/A Exposed Structures: Fat Layer (Subcutaneous Tissue): N/A N/A Yes Fascia: No Tendon: No Muscle: No Joint: No Bone: No Epithelialization: Small (1-33%) N/A N/A Treatment Notes Electronic Signature(s) Signed: 03/23/2021 4:32:50 PM By: Carlene Coria RN Entered By: Carlene Coria on 03/23/2021 09:29:47 [place], Anthonie Carlean Jews ([id number]) -------------------------------------------------------------------------------- [place] Details Patient Name: [name] Date of Service: 03/23/2021 9:00 AM Medical Record Number: [id number] Patient Account Number: [id number] Date of Birth/Sex: [birth year] (50 y.o. M) Treating RN: Carlene Coria Primary Care Kabrina Christiano: PATIENT, NO Other Clinician: Referring Kimbly Eanes: Barkley Boards Treating Yarelly Kuba/Extender: Skipper Cliche in Treatment: 10 Active Inactive Wound/Skin Impairment Nursing Diagnoses: Knowledge deficit related to  ulceration/compromised skin integrity Goals: Patient/caregiver will verbalize understanding of skin care regimen Date Initiated: 01/12/2021 Date Inactivated: 02/23/2021 Target Resolution Date: 02/12/2021 Goal Status: Met Ulcer/skin breakdown will have a volume reduction of 30% by week 4 Date Initiated: 01/12/2021 Date Inactivated: 02/23/2021 Target Resolution Date: 02/12/2021 Goal Status: Met Ulcer/skin breakdown will have a volume reduction of 50% by week 8 Date Initiated: 01/12/2021 Date Inactivated: 03/16/2021 Target Resolution Date: 03/14/2021 Goal Status: Unmet Unmet Reason: comorbities Ulcer/skin breakdown will have a volume reduction of 80% by week 12 Date Initiated: 01/12/2021 Target Resolution Date: 04/14/2021 Goal Status: Active Ulcer/skin breakdown will heal within 14 weeks Date Initiated: 01/12/2021 Target Resolution Date: 05/15/2021 Goal Status: Active Interventions: Assess patient/caregiver ability to obtain necessary supplies Assess patient/caregiver ability to perform ulcer/skin care regimen upon admission and as needed Assess ulceration(s) every visit Notes: Electronic Signature(s) Signed: 03/23/2021 4:32:50 PM By: Carlene Coria RN Entered By: Carlene Coria on 03/23/2021 09:29:38 [place], [place] ([id number]) -------------------------------------------------------------------------------- Pain Assessment Details Patient Name: [name] Date of Service: 03/23/2021 9:00 AM Medical Record Number: [id number] Patient Account Number: [id number] Date of Birth/Sex: [birth year] (50 y.o. M) Treating RN: Carlene Coria Primary Care Lynnita Somma: PATIENT, NO Other Clinician: Referring Laniah Grimm: Barkley Boards Treating Gay Moncivais/Extender: Skipper Cliche in Treatment: 10 Active Problems Location of Pain Severity and Description of Pain Patient Has Paino No Site Locations Pain Management and Medication Current Pain Management: Electronic Signature(s) Signed: 03/23/2021 4:32:50 PM By:  Carlene Coria RN Entered By: Carlene Coria on 03/23/2021 09:11:30 [name] ([id number]) -------------------------------------------------------------------------------- Patient/Caregiver Education Details Patient Name: [name] Date of Service: 03/23/2021 9:00 AM Medical Record Number: [id number] Patient Account Number: [id number] Date of Birth/Gender: [birth year] (50 y.o. M) Treating RN: Carlene Coria Primary Care Physician: PATIENT, NO Other Clinician: Referring Physician: Barkley Boards Treating Physician/Extender: Skipper Cliche in Treatment: 10 Education Assessment Education Provided To: Patient Education Topics Provided Wound/Skin Impairment: Methods: Explain/Verbal Responses: State content correctly Electronic Signature(s) Signed: 03/23/2021 4:32:50 PM By: Carlene Coria RN Entered By: Carlene Coria on 03/23/2021 09:42:16 Bomar, Sherill L. ([id number]) -------------------------------------------------------------------------------- Wound Assessment Details Patient Name: [name] Date of Service: 03/23/2021 9:00 AM Medical Record Number: [id number] Patient Account Number: [id number] Date of Birth/Sex: [birth year] (50 y.o. M) Treating RN: Carlene Coria Primary Care Madalina Rosman: PATIENT, NO Other Clinician: Referring Belina Mandile: Barkley Boards Treating Nkosi Cortright/Extender: Skipper Cliche in Treatment: 10 Wound Status Wound  Number: 1 Primary Etiology: Venous Leg Ulcer Wound Location: Right, Medial Lower Leg Wound Status: Open Wounding Event: Gradually Appeared Date Acquired: 11/24/2020 Weeks Of Treatment: 10 Clustered Wound: No Photos Wound Measurements Length: (cm) 1.5 Width: (cm) 1.6 Depth: (cm) 0.3 Area: (cm) 1.885 Volume: (cm) 0.565 % Reduction in Area: -122.3% % Reduction in Volume: -232.4% Epithelialization: Small (1-33%) Tunneling: No Undermining: No Wound Description Classification: Full Thickness Without Exposed Support Structu Wound Margin: Flat  and Intact Exudate Amount: Medium Exudate Type: Serosanguineous Exudate Color: red, brown res Foul Odor After Cleansing: No Slough/Fibrino Yes Wound Bed Granulation Amount: Small (1-33%) Exposed Structure Granulation Quality: Red, Pink Fascia Exposed: No Necrotic Amount: Large (67-100%) Fat Layer (Subcutaneous Tissue) Exposed: Yes Necrotic Quality: Adherent Slough Tendon Exposed: No Muscle Exposed: No Joint Exposed: No Bone Exposed: No Treatment Notes Wound #1 (Lower Leg) Wound Laterality: Right, Medial Cleanser Soap and Water Discharge Instruction: Gently cleanse wound with antibacterial soap, rinse and pat dry prior to dressing wounds Peri-Wound Care CHAROD, SLAWINSKI L. ([id number]) Triamcinolone Acetonide Cream, 0.1%, 15 (g) tube Discharge Instruction: in wound clinic Clobetasol Discharge Instruction: to peri wound and wound base Topical Primary Dressing Hydrofera Blue Ready Transfer Foam, 2.5x2.5 (in/in) Discharge Instruction: Apply Hydrofera Blue Ready to wound bed as directed Secondary Dressing Bordered Gauze Sterile-HBD 4x4 (in/in) Discharge Instruction: Cover wound with Bordered Guaze Sterile as directed Secured With Compression Wrap Compression Stockings Add-Ons Electronic Signature(s) Signed: 03/23/2021 4:32:50 PM By: Carlene Coria RN Entered By: Carlene Coria on 03/23/2021 09:17:49 [place], [place] ([id number]) -------------------------------------------------------------------------------- Vitals Details Patient Name: [name] Date of Service: 03/23/2021 9:00 AM Medical Record Number: [id number] Patient Account Number: [id number] Date of Birth/Sex: 05/07/1971 (50 y.o. M) Treating RN: Carlene Coria Primary Care Twain Stenseth: PATIENT, NO Other Clinician: Referring Darrio Bade: Barkley Boards Treating Karol Liendo/Extender: Skipper Cliche in Treatment: 10 Vital Signs Time Taken: 09:11 Temperature (F): 98.2 Height (in): 78 Pulse (bpm): 64 Weight (lbs):  249 Respiratory Rate (breaths/min): 18 Body Mass Index (BMI): 28.8 Blood Pressure (mmHg): 138/80 Reference Range: 80 - 120 mg / dl Electronic Signature(s) Signed: 03/23/2021 4:32:50 PM By: Carlene Coria RN Entered By: Carlene Coria on 03/23/2021 09:11:24

## 2021-03-24 NOTE — Progress Notes (Signed)
Nicolas, [name] ([id number]) Visit Report for 03/16/2021 Arrival Information Details Patient Name: [name], [name]. Date of Service: 03/16/2021 9:00 AM Medical Record Number: [id number] Patient Account Number: [id number] Date of Birth/Sex: 01-Feb-1971 (50 y.o. M) Treating RN: Carlene Coria Primary Care Cruise Baumgardner: PATIENT, NO Other Clinician: Referring Tristin Gladman: Barkley Boards Treating Faith Patricelli/Extender: Eduard Roux in Treatment: 9 Visit Information History Since Last Visit All ordered tests and consults were completed: No Patient Arrived: Ambulatory Added or deleted any medications: No Arrival Time: 09:20 Any new allergies or adverse reactions: No Accompanied By: self Had a fall or experienced change in No Transfer Assistance: None activities of daily living that may affect Patient Identification Verified: Yes risk of falls: Secondary Verification Process Completed: Yes Signs or symptoms of abuse/neglect since last visito No Patient Requires Transmission-Based Precautions: No Hospitalized since last visit: No Patient Has Alerts: Yes Implantable device outside of the clinic excluding No Patient Alerts: NOT DIABETIC cellular tissue based products placed in the center since last visit: Has Dressing in Place as Prescribed: Yes Pain Present Now: Yes Electronic Signature(s) Signed: 03/23/2021 4:33:49 PM By: Carlene Coria RN Entered By: Carlene Coria on 03/16/2021 09:20:59 Solazzo, Frohna ([id number]) -------------------------------------------------------------------------------- Clinic Level of Care Assessment Details Patient Name: Nicolas Cousin L. Date of Service: 03/16/2021 9:00 AM Medical Record Number: [id number] Patient Account Number: [id number] Date of Birth/Sex: 11/16/1970 (50 y.o. M) Treating RN: Carlene Coria Primary Care Moris Ratchford: PATIENT, NO Other Clinician: Referring Leisel Pinette: Barkley Boards Treating Arlynn Stare/Extender: Eduard Roux in Treatment: 9 Clinic Level  of Care Assessment Items TOOL 1 Quantity Score _0  - Use when EandM and Procedure is performed on INITIAL visit 0 ASSESSMENTS - Nursing Assessment / Reassessment _1  - General Physical Exam (combine w/ comprehensive assessment (listed just below) when performed on new 0 pt. evals) _2  - 0 Comprehensive Assessment (HX, ROS, Risk Assessments, Wounds Hx, etc.) ASSESSMENTS - Wound and Skin Assessment / Reassessment _3  - Dermatologic / Skin Assessment (not related to wound area) 0 ASSESSMENTS - Ostomy and/or Continence Assessment and Care _4  - Incontinence Assessment and Management 0 _5  - 0 Ostomy Care Assessment and Management (repouching, etc.) PROCESS - Coordination of Care _6  - Simple Patient / Family Education for ongoing care 0 _7  - 0 Complex (extensive) Patient / Family Education for ongoing care _8  - 0 Staff obtains Programmer, systems, Records, Test Results / Process Orders _9  - 0 Staff telephones HHA, Nursing Homes / Clarify orders / etc _10  - 0 Routine Transfer to another Facility (non-emergent condition) _11  - 0 Routine Hospital Admission (non-emergent condition) _12  - 0 New Admissions / Biomedical engineer / Ordering NPWT, Apligraf, etc. _13  - 0 Emergency Hospital Admission (emergent condition) PROCESS - Special Needs _14  - Pediatric / Minor Patient Management 0 _15  - 0 Isolation Patient Management _16  - 0 Hearing / Language / Visual special needs _17  - 0 Assessment of Community assistance (transportation, D/C planning, etc.) _18  - 0 Additional assistance / Altered mentation _19  - 0 Support Surface(s) Assessment (bed, cushion, seat, etc.) INTERVENTIONS - Miscellaneous _20  - External ear exam 0 _21  - 0 Patient Transfer (multiple staff / Civil Service fast streamer / Similar devices) _22  - 0 Simple Staple / Suture removal (25 or less) _23  - 0 Complex Staple / Suture removal (26 or more) _24  - 0 Hypo/Hyperglycemic Management (do not check if billed separately) _25  - 0 Ankle / Brachial Index (ABI) -  do not check if billed separately Has the patient been seen at the hospital within the last three years: Yes  Total Score: 0 Level Of Care: ____ [name] ([id number]) Electronic Signature(s) Signed: 03/23/2021 4:33:49 PM By: Carlene Coria RN Entered By: Carlene Coria on 03/16/2021 09:36:27 Nicolas, Arvon LMarland [name] ([id number]) -------------------------------------------------------------------------------- Compression Therapy Details Patient Name: Nicolas Cousin L. Date of Service: 03/16/2021 9:00 AM Medical Record Number: [id number] Patient Account Number: [id number] Date of Birth/Sex: [birth year] (50 y.o. M) Treating RN: Carlene Coria Primary Care Tsering Leaman: PATIENT, NO Other Clinician: Referring Anthonee Gelin: Barkley Boards Treating Riker Collier/Extender: Skipper Cliche in Treatment: 9 Compression Therapy Performed for Wound Assessment: Wound #1 Right,Medial Lower Leg Performed By: Clinician Carlene Coria, RN Compression Type: Four Layer Post Procedure Diagnosis Same as Pre-procedure Electronic Signature(s) Signed: 03/23/2021 4:33:49 PM By: Carlene Coria RN Entered By: Carlene Coria on 03/16/2021 [place], [place] L. ([id number]) -------------------------------------------------------------------------------- Encounter Discharge Information Details Patient Name: LAZAR, TIERCE L. Date of Service: 03/16/2021 9:00 AM Medical Record Number: [id number] Patient Account Number: [id number] Date of Birth/Sex: Feb 14, 1971 (50 y.o. M) Treating RN: Carlene Coria Primary Care Adisson Deak: PATIENT, NO Other Clinician: Referring Patton Rabinovich: Barkley Boards Treating Abdi Husak/Extender: Skipper Cliche in Treatment: 9 Encounter Discharge Information Items Post Procedure Vitals Discharge Condition: Stable Temperature (F): 98.6 Ambulatory Status: Ambulatory Pulse (bpm): 68 Discharge Destination: Home Respiratory Rate (breaths/min): 18 Transportation: Private Auto Blood Pressure (mmHg): 142/70 Accompanied By:  self Schedule Follow-up Appointment: Yes Clinical Summary of Care: Patient Declined Electronic Signature(s) Signed: 03/23/2021 4:33:49 PM By: Carlene Coria RN Entered By: Carlene Coria on 03/16/2021 09:52:09 Bhandari, Abner L. ([id number]) -------------------------------------------------------------------------------- Lower Extremity Assessment Details Patient Name: Nicolas Cousin L. Date of Service: 03/16/2021 9:00 AM Medical Record Number: [id number] Patient Account Number: [id number] Date of Birth/Sex: 1971-04-22 (50 y.o. M) Treating RN: Carlene Coria Primary Care Zakira Ressel: PATIENT, NO Other Clinician: Referring Parry Po: Barkley Boards Treating Kellsey Sansone/Extender: Joaquim Lai, ABIGAIL Weeks in Treatment: 9 Edema Assessment Assessed: [Left: No] [Right: No] [Left: Edema] [Right: :] Calf Left: Right: Point of Measurement: 48 cm From Medial Instep 36 cm Ankle Left: Right: Point of Measurement: 10 cm From Medial Instep 25 cm Vascular Assessment Pulses: Dorsalis Pedis Palpable: [Right:Yes] Electronic Signature(s) Signed: 03/23/2021 4:33:49 PM By: Carlene Coria RN Entered By: Carlene Coria on 03/16/2021 09:25:54 Hipps, Angle L. ([id number]) -------------------------------------------------------------------------------- Multi Wound Chart Details Patient Name: Nicolas Cousin L. Date of Service: 03/16/2021 9:00 AM Medical Record Number: [id number] Patient Account Number: [id number] Date of Birth/Sex: [birth year] (50 y.o. M) Treating RN: Carlene Coria Primary Care Erling Arrazola: PATIENT, NO Other Clinician: Referring Lakyla Biswas: Barkley Boards Treating Tricia Pledger/Extender: Joaquim Lai, ABIGAIL Weeks in Treatment: 9 Vital Signs Height(in): 78 Pulse(bpm): 76 Weight(lbs): 249 Blood Pressure(mmHg): 142/70 Body Mass Index(BMI): 29 Temperature(F): 98.6 Respiratory Rate(breaths/min): 18 Photos: [N/A:N/A] Wound Location: Right, Medial Lower Leg N/A N/A Wounding Event: Gradually Appeared N/A N/A Primary Etiology:  Venous Leg Ulcer N/A N/A Date Acquired: 11/24/2020 N/A N/A Weeks of Treatment: 9 N/A N/A Wound Status: Open N/A N/A Measurements L x W x D (cm) 1.1x1.1x0.3 N/A N/A Area (cm) : 0.95 N/A N/A Volume (cm) : 0.285 N/A N/A % Reduction in Area: -12.00% N/A N/A % Reduction in Volume: -67.60% N/A N/A Classification: Full Thickness Without Exposed N/A N/A Support Structures Exudate Amount: Medium N/A N/A Exudate Type: Serosanguineous N/A N/A Exudate Color: red, brown N/A N/A Wound Margin: Flat and Intact N/A N/A Granulation Amount: Small (1-33%) N/A N/A Granulation Quality: Red, Pink N/A N/A Necrotic Amount: Large (67-100%) N/A N/A Exposed Structures: Fat Layer (Subcutaneous Tissue): N/A N/A Yes Fascia: No Tendon: No Muscle: No Joint: No Bone: No Epithelialization: Small (1-33%) N/A N/A Treatment Notes  Electronic Signature(s) Signed: 03/23/2021 4:33:49 PM By: Carlene Coria RN Entered By: Carlene Coria on 03/16/2021 09:32:34 [name] ([id number]) -------------------------------------------------------------------------------- [place] Details Patient Name: JOURDIN, CONNORS L. Date of Service: 03/16/2021 9:00 AM Medical Record Number: [id number] Patient Account Number: [id number] Date of Birth/Sex: 1971/01/19 (50 y.o. M) Treating RN: Carlene Coria Primary Care Vannak Montenegro: PATIENT, NO Other Clinician: Referring Lorina Duffner: Barkley Boards Treating Taysen Bushart/Extender: Dorian Furnace Weeks in Treatment: 9 Active Inactive Wound/Skin Impairment Nursing Diagnoses: Knowledge deficit related to ulceration/compromised skin integrity Goals: Patient/caregiver will verbalize understanding of skin care regimen Date Initiated: 01/12/2021 Date Inactivated: 02/23/2021 Target Resolution Date: 02/12/2021 Goal Status: Met Ulcer/skin breakdown will have a volume reduction of 30% by week 4 Date Initiated: 01/12/2021 Date Inactivated: 02/23/2021 Target Resolution Date: 02/12/2021 Goal Status:  Met Ulcer/skin breakdown will have a volume reduction of 50% by week 8 Date Initiated: 01/12/2021 Date Inactivated: 03/16/2021 Target Resolution Date: 03/14/2021 Goal Status: Unmet Unmet Reason: comorbities Ulcer/skin breakdown will have a volume reduction of 80% by week 12 Date Initiated: 01/12/2021 Target Resolution Date: 04/14/2021 Goal Status: Active Ulcer/skin breakdown will heal within 14 weeks Date Initiated: 01/12/2021 Target Resolution Date: 05/15/2021 Goal Status: Active Interventions: Assess patient/caregiver ability to obtain necessary supplies Assess patient/caregiver ability to perform ulcer/skin care regimen upon admission and as needed Assess ulceration(s) every visit Notes: Electronic Signature(s) Signed: 03/23/2021 4:33:49 PM By: Carlene Coria RN Entered By: Carlene Coria on 03/16/2021 09:32:21 Shrout, [place] ([id number]) -------------------------------------------------------------------------------- Pain Assessment Details Patient Name: Nicolas Cousin L. Date of Service: 03/16/2021 9:00 AM Medical Record Number: [id number] Patient Account Number: [id number] Date of Birth/Sex: 1970/10/31 (50 y.o. M) Treating RN: Carlene Coria Primary Care Layne Dilauro: PATIENT, NO Other Clinician: Referring Eswin Worrell: Barkley Boards Treating Isa Kohlenberg/Extender: Dorian Furnace Weeks in Treatment: 9 Active Problems Location of Pain Severity and Description of Pain Patient Has Paino No Site Locations Pain Management and Medication Current Pain Management: Electronic Signature(s) Signed: 03/23/2021 4:33:49 PM By: Carlene Coria RN Entered By: Carlene Coria on 03/16/2021 09:21:44 [name] ([id number]) -------------------------------------------------------------------------------- Patient/Caregiver Education Details Patient Name: Nicolas Cousin L. Date of Service: 03/16/2021 9:00 AM Medical Record Number: [id number] Patient Account Number: [id number] Date of Birth/Gender: 10/07/1970 (50 y.o.  M) Treating RN: Carlene Coria Primary Care Physician: PATIENT, NO Other Clinician: Referring Physician: Barkley Boards Treating Physician/Extender: Eduard Roux in Treatment: 9 Education Assessment Education Provided To: Patient Education Topics Provided Wound/Skin Impairment: Methods: Explain/Verbal Responses: State content correctly Electronic Signature(s) Signed: 03/23/2021 4:33:49 PM By: Carlene Coria RN Entered By: Carlene Coria on 03/16/2021 09:36:42 [place], [place] ([id number]) -------------------------------------------------------------------------------- Wound Assessment Details Patient Name: Nicolas Cousin L. Date of Service: 03/16/2021 9:00 AM Medical Record Number: [id number] Patient Account Number: [id number] Date of Birth/Sex: 1971-04-22 (50 y.o. M) Treating RN: Carlene Coria Primary Care Weslynn Ke: PATIENT, NO Other Clinician: Referring Shantay Sonn: Barkley Boards Treating Kristen Bushway/Extender: Joaquim Lai, ABIGAIL Weeks in Treatment: 9 Wound Status Wound Number: 1 Primary Etiology: Venous Leg Ulcer Wound Location: Right, Medial Lower Leg Wound Status: Open Wounding Event: Gradually Appeared Date Acquired: 11/24/2020 Weeks Of Treatment: 9 Clustered Wound: No Photos Wound Measurements Length: (cm) 1.1 Width: (cm) 1.1 Depth: (cm) 0.3 Area: (cm) 0.95 Volume: (cm) 0.285 % Reduction in Area: -12% % Reduction in Volume: -67.6% Epithelialization: Small (1-33%) Tunneling: No Undermining: No Wound Description Classification: Full Thickness Without Exposed Support Structu Wound Margin: Flat and Intact Exudate Amount: Medium Exudate Type: Serosanguineous Exudate Color: red, brown res Foul Odor After Cleansing: No Slough/Fibrino Yes Wound Bed Granulation Amount: Small (1-33%) Exposed Structure  Granulation Quality: Red, Pink Fascia Exposed: No Necrotic Amount: Large (67-100%) Fat Layer (Subcutaneous Tissue) Exposed: Yes Necrotic Quality: Adherent Slough Tendon Exposed:  No Muscle Exposed: No Joint Exposed: No Bone Exposed: No Electronic Signature(s) Signed: 03/23/2021 4:33:49 PM By: Carlene Coria RN Entered By: Carlene Coria on 03/16/2021 09:24:37 Olinde, Braxtin L. ([id number]) -------------------------------------------------------------------------------- Vitals Details Patient Name: Nicolas Cousin L. Date of Service: 03/16/2021 9:00 AM Medical Record Number: [id number] Patient Account Number: [id number] Date of Birth/Sex: 09-Feb-1971 (50 y.o. M) Treating RN: Carlene Coria Primary Care Novalynn Branaman: PATIENT, NO Other Clinician: Referring Kineta Fudala: Barkley Boards Treating Yoshimi Sarr/Extender: Joaquim Lai, ABIGAIL Weeks in Treatment: 9 Vital Signs Time Taken: 09:21 Temperature (F): 98.6 Height (in): 78 Pulse (bpm): 68 Weight (lbs): 249 Respiratory Rate (breaths/min): 18 Body Mass Index (BMI): 28.8 Blood Pressure (mmHg): 142/70 Reference Range: 80 - 120 mg / dl Electronic Signature(s) Signed: 03/23/2021 4:33:49 PM By: Carlene Coria RN Entered By: Carlene Coria on 03/16/2021 09:21:33

## 2021-03-30 ENCOUNTER — Other Ambulatory Visit: Payer: Self-pay

## 2021-03-30 ENCOUNTER — Encounter: Payer: Self-pay | Attending: Physician Assistant | Admitting: Physician Assistant

## 2021-03-30 DIAGNOSIS — L97812 Non-pressure chronic ulcer of other part of right lower leg with fat layer exposed: Secondary | ICD-10-CM | POA: Insufficient documentation

## 2021-03-30 DIAGNOSIS — I872 Venous insufficiency (chronic) (peripheral): Secondary | ICD-10-CM | POA: Insufficient documentation

## 2021-03-30 NOTE — Progress Notes (Signed)
DURREL, MCNEE ([id number]) Visit Report for 03/30/2021 Arrival Information Details Patient Name: PATRIC, BUCKHALTER. Date of Service: 03/30/2021 9:00 AM Medical Record Number: [id number] Patient Account Number: [id number] Date of Birth/Sex: 03/28/1971 (50 y.o. M) Treating RN: Dolan Amen Primary Care Teagan Ozawa: PATIENT, NO Other Clinician: Referring Benancio Osmundson: Barkley Boards Treating Jacqulene Huntley/Extender: Suella Grove in Treatment: 11 Visit Information History Since Last Visit Pain Present Now: No Patient Arrived: Ambulatory Arrival Time: 09:17 Accompanied By: self Transfer Assistance: None Patient Identification Verified: Yes Secondary Verification Process Completed: Yes Patient Requires Transmission-Based Precautions: No Patient Has Alerts: Yes Patient Alerts: NOT DIABETIC Electronic Signature(s) Signed: 03/30/2021 4:41:19 PM By: Dolan Amen RN Entered By: Dolan Amen on 03/30/2021 09:17:51 [place], [place]. ([id number]) -------------------------------------------------------------------------------- Clinic Level of Care Assessment Details Patient Name: [name] Date of Service: 03/30/2021 9:00 AM Medical Record Number: [id number] Patient Account Number: [id number] Date of Birth/Sex: Jan 23, 1971 (50 y.o. M) Treating RN: Dolan Amen Primary Care Shakeela Rabadan: PATIENT, NO Other Clinician: Referring Cace Osorto: Barkley Boards Treating Christo Hain/Extender: Suella Grove in Treatment: 11 Clinic Level of Care Assessment Items TOOL 4 Quantity Score X - Use when only an EandM is performed on FOLLOW-UP visit 1 0 ASSESSMENTS - Nursing Assessment / Reassessment X - Reassessment of Co-morbidities (includes updates in patient status) 1 10 X- 1 5 Reassessment of Adherence to Treatment Plan ASSESSMENTS - Wound and Skin Assessment / Reassessment X - Simple Wound Assessment / Reassessment - one wound 1 5 []  - 0 Complex Wound Assessment / Reassessment - multiple wounds []  - 0 Dermatologic / Skin Assessment (not  related to wound area) ASSESSMENTS - Focused Assessment []  - Circumferential Edema Measurements - multi extremities 0 []  - 0 Nutritional Assessment / Counseling / Intervention []  - 0 Lower Extremity Assessment (monofilament, tuning fork, pulses) []  - 0 Peripheral Arterial Disease Assessment (using hand held doppler) ASSESSMENTS - Ostomy and/or Continence Assessment and Care []  - Incontinence Assessment and Management 0 []  - 0 Ostomy Care Assessment and Management (repouching, etc.) PROCESS - Coordination of Care X - Simple Patient / Family Education for ongoing care 1 15 []  - 0 Complex (extensive) Patient / Family Education for ongoing care []  - 0 Staff obtains Programmer, systems, Records, Test Results / Process Orders []  - 0 Staff telephones HHA, Nursing Homes / Clarify orders / etc []  - 0 Routine Transfer to another Facility (non-emergent condition) []  - 0 Routine Hospital Admission (non-emergent condition) []  - 0 New Admissions / Biomedical engineer / Ordering NPWT, Apligraf, etc. []  - 0 Emergency Hospital Admission (emergent condition) X- 1 10 Simple Discharge Coordination []  - 0 Complex (extensive) Discharge Coordination PROCESS - Special Needs []  - Pediatric / Minor Patient Management 0 []  - 0 Isolation Patient Management []  - 0 Hearing / Language / Visual special needs []  - 0 Assessment of Community assistance (transportation, D/C planning, etc.) []  - 0 Additional assistance / Altered mentation []  - 0 Support Surface(s) Assessment (bed, cushion, seat, etc.) INTERVENTIONS - Wound Cleansing / Measurement Treiber, Mardy L. ([id number]) X- 1 5 Simple Wound Cleansing - one wound []  - 0 Complex Wound Cleansing - multiple wounds X- 1 5 Wound Imaging (photographs - any number of wounds) []  - 0 Wound Tracing (instead of photographs) X- 1 5 Simple Wound Measurement - one wound []  - 0 Complex Wound Measurement - multiple wounds INTERVENTIONS - Wound Dressings []  -  Small Wound Dressing one or multiple wounds 0 X- 1 15 Medium Wound Dressing one or multiple wounds []  - 0 Large Wound  Dressing one or multiple wounds []  - 0 Application of Medications - topical []  - 0 Application of Medications - injection INTERVENTIONS - Miscellaneous []  - External ear exam 0 []  - 0 Specimen Collection (cultures, biopsies, blood, body fluids, etc.) []  - 0 Specimen(s) / Culture(s) sent or taken to Lab for analysis []  - 0 Patient Transfer (multiple staff / Civil Service fast streamer / Similar devices) []  - 0 Simple Staple / Suture removal (25 or less) []  - 0 Complex Staple / Suture removal (26 or more) []  - 0 Hypo / Hyperglycemic Management (close monitor of Blood Glucose) []  - 0 Ankle / Brachial Index (ABI) - do not check if billed separately X- 1 5 Vital Signs Has the patient been seen at the hospital within the last three years: Yes Total Score: 80 Level Of Care: New/Established - Level 3 Electronic Signature(s) Signed: 03/30/2021 4:41:19 PM By: Dolan Amen RN Entered By: Dolan Amen on 03/30/2021 09:58:26 Lannan, Arne L. ([id number]) -------------------------------------------------------------------------------- Encounter Discharge Information Details Patient Name: [name] Date of Service: 03/30/2021 9:00 AM Medical Record Number: [id number] Patient Account Number: [id number] Date of Birth/Sex: [birth year] (50 y.o. M) Treating RN: Dolan Amen Primary Care Ashan Cueva: PATIENT, NO Other Clinician: Referring Male Minish: Barkley Boards Treating Atisha Hamidi/Extender: Suella Grove in Treatment: 11 Encounter Discharge Information Items Discharge Condition: Stable Ambulatory Status: Ambulatory Discharge Destination: Home Transportation: Private Auto Accompanied By: self Schedule Follow-up Appointment: Yes Clinical Summary of Care: Electronic Signature(s) Signed: 03/30/2021 4:41:19 PM By: Dolan Amen RN Entered By: Dolan Amen on 03/30/2021 09:59:13 [place], [place] ([id number]) -------------------------------------------------------------------------------- Lower Extremity Assessment Details Patient Name: VINCE, AINSLEY L. Date of Service: 03/30/2021 9:00 AM Medical Record Number: [id number] Patient Account Number: [id number] Date of Birth/Sex: March 13, 1971 (50 y.o. M) Treating RN: Dolan Amen Primary Care Lemmie Steinhaus: PATIENT, NO Other Clinician: Referring Ivory Bail: Barkley Boards Treating Betzaira Mentel/Extender: Suella Grove in Treatment: 11 Edema Assessment Assessed: [Left: No] [Right: Yes] Edema: [Left: Ye] [Right: s] Calf Left: Right: Point of Measurement: 40 cm From Medial Instep 35 cm Ankle Left: Right: Point of Measurement: 10 cm From Medial Instep 25 cm Vascular Assessment Pulses: Dorsalis Pedis Palpable: [Right:Yes] Electronic Signature(s) Signed: 03/30/2021 4:41:19 PM By: Dolan Amen RN Entered By: Dolan Amen on 03/30/2021 09:25:07 [place], [place] ([id number]) -------------------------------------------------------------------------------- Multi Wound Chart Details Patient Name: [name] Date of Service: 03/30/2021 9:00 AM Medical Record Number: [id number] Patient Account Number: [id number] Date of Birth/Sex: 20-Apr-1971 (50 y.o. M) Treating RN: Dolan Amen Primary Care Annai Heick: PATIENT, NO Other Clinician: Referring Lilymarie Scroggins: Barkley Boards Treating Ophie Burrowes/Extender: Suella Grove in Treatment: 11 Vital Signs Height(in): 78 Pulse(bpm): 61 Weight(lbs): 249 Blood Pressure(mmHg): 126/77 Body Mass Index(BMI): 29 Temperature(F): 98.8 Respiratory Rate(breaths/min): 18 Photos: [N/A:N/A] Wound Location: Right, Medial Lower Leg N/A N/A Wounding Event: Gradually Appeared N/A N/A Primary Etiology: Venous Leg Ulcer N/A N/A Date Acquired: 11/24/2020 N/A N/A Weeks of Treatment: 11 N/A N/A Wound Status: Open N/A N/A Measurements L x W x D (cm) 1.2x1.4x0.2 N/A N/A Area (cm) : 1.319 N/A N/A Volume (cm) : 0.264 N/A N/A % Reduction in Area:  -55.50% N/A N/A % Reduction in Volume: -55.30% N/A N/A Classification: Full Thickness Without Exposed N/A N/A Support Structures Exudate Amount: Medium N/A N/A Exudate Type: Serosanguineous N/A N/A Exudate Color: red, brown N/A N/A Wound Margin: Flat and Intact N/A N/A Granulation Amount: Medium (34-66%) N/A N/A Granulation Quality: Red N/A N/A Necrotic Amount: Medium (34-66%) N/A N/A Exposed Structures: Fat Layer (Subcutaneous Tissue): N/A N/A Yes Fascia: No Tendon:  No Muscle: No Joint: No Bone: No Epithelialization: Small (1-33%) N/A N/A Treatment Notes Electronic Signature(s) Signed: 03/30/2021 4:41:19 PM By: Dolan Amen RN Entered By: Dolan Amen on 03/30/2021 09:30:26 [name] ([id number]) -------------------------------------------------------------------------------- [place] Details Patient Name: KAELYN, NAUTA L. Date of Service: 03/30/2021 9:00 AM Medical Record Number: [id number] Patient Account Number: [id number] Date of Birth/Sex: [birth year] (50 y.o. M) Treating RN: Dolan Amen Primary Care Jaevian Shean: PATIENT, NO Other Clinician: Referring Finch Costanzo: Barkley Boards Treating Theadora Noyes/Extender: Suella Grove in Treatment: 11 Active Inactive Wound/Skin Impairment Nursing Diagnoses: Knowledge deficit related to ulceration/compromised skin integrity Goals: Patient/caregiver will verbalize understanding of skin care regimen Date Initiated: 01/12/2021 Date Inactivated: 02/23/2021 Target Resolution Date: 02/12/2021 Goal Status: Met Ulcer/skin breakdown will have a volume reduction of 30% by week 4 Date Initiated: 01/12/2021 Date Inactivated: 02/23/2021 Target Resolution Date: 02/12/2021 Goal Status: Met Ulcer/skin breakdown will have a volume reduction of 50% by week 8 Date Initiated: 01/12/2021 Date Inactivated: 03/16/2021 Target Resolution Date: 03/14/2021 Goal Status: Unmet Unmet Reason: comorbities Ulcer/skin breakdown will have a volume  reduction of 80% by week 12 Date Initiated: 01/12/2021 Target Resolution Date: 04/14/2021 Goal Status: Active Ulcer/skin breakdown will heal within 14 weeks Date Initiated: 01/12/2021 Target Resolution Date: 05/15/2021 Goal Status: Active Interventions: Assess patient/caregiver ability to obtain necessary supplies Assess patient/caregiver ability to perform ulcer/skin care regimen upon admission and as needed Assess ulceration(s) every visit Notes: Electronic Signature(s) Signed: 03/30/2021 4:41:19 PM By: Dolan Amen RN Entered By: Dolan Amen on 03/30/2021 [place], [place]. ([id number]) -------------------------------------------------------------------------------- Pain Assessment Details Patient Name: [name] Date of Service: 03/30/2021 9:00 AM Medical Record Number: [id number] Patient Account Number: [id number] Date of Birth/Sex: 09-Oct-1970 (50 y.o. M) Treating RN: Dolan Amen Primary Care Ashtin Rosner: PATIENT, NO Other Clinician: Referring Harbert Fitterer: Barkley Boards Treating Latoi Giraldo/Extender: Suella Grove in Treatment: 11 Active Problems Location of Pain Severity and Description of Pain Patient Has Paino No Site Locations Rate the pain. Current Pain Level: 0 Pain Management and Medication Current Pain Management: Electronic Signature(s) Signed: 03/30/2021 4:41:19 PM By: Dolan Amen RN Entered By: Dolan Amen on 03/30/2021 09:18:52 [name] ([id number]) -------------------------------------------------------------------------------- Patient/Caregiver Education Details Patient Name: [name] Date of Service: 03/30/2021 9:00 AM Medical Record Number: [id number] Patient Account Number: [id number] Date of Birth/Gender: [birth year] (50 y.o. M) Treating RN: Dolan Amen Primary Care Physician: PATIENT, NO Other Clinician: Referring Physician: Barkley Boards Treating Physician/Extender: Suella Grove in Treatment: 11 Education Assessment Education Provided  To: Patient Education Topics Provided Wound/Skin Impairment: Methods: Explain/Verbal Responses: State content correctly Electronic Signature(s) Signed: 03/30/2021 4:41:19 PM By: Dolan Amen RN Entered By: Dolan Amen on 03/30/2021 09:58:40 [place], [place] ([id number]) -------------------------------------------------------------------------------- Wound Assessment Details Patient Name: JETER, TOMEY L. Date of Service: 03/30/2021 9:00 AM Medical Record Number: [id number] Patient Account Number: [id number] Date of Birth/Sex: 1970/11/17 (50 y.o. M) Treating RN: Dolan Amen Primary Care Tyniesha Howald: PATIENT, NO Other Clinician: Referring Myldred Raju: Barkley Boards Treating Hansen Carino/Extender: Suella Grove in Treatment: 11 Wound Status Wound Number: 1 Primary Etiology: Venous Leg Ulcer Wound Location: Right, Medial Lower Leg Wound Status: Open Wounding Event: Gradually Appeared Date Acquired: 11/24/2020 Weeks Of Treatment: 11 Clustered Wound: No Photos Wound Measurements Length: (cm) 1.2 Width: (cm) 1.4 Depth: (cm) 0.2 Area: (cm) 1.319 Volume: (cm) 0.264 % Reduction in Area: -55.5% % Reduction in Volume: -55.3% Epithelialization: Small (1-33%) Tunneling: No Undermining: No Wound Description Classification: Full Thickness Without Exposed Support Structu Wound Margin: Flat and Intact Exudate Amount: Medium Exudate Type:  Serosanguineous Exudate Color: red, brown res Foul Odor After Cleansing: No Slough/Fibrino Yes Wound Bed Granulation Amount: Medium (34-66%) Exposed Structure Granulation Quality: Red Fascia Exposed: No Necrotic Amount: Medium (34-66%) Fat Layer (Subcutaneous Tissue) Exposed: Yes Necrotic Quality: Adherent Slough Tendon Exposed: No Muscle Exposed: No Joint Exposed: No Bone Exposed: No Treatment Notes Wound #1 (Lower Leg) Wound Laterality: Right, Medial Cleanser Soap and Water Discharge Instruction: Gently cleanse wound with antibacterial soap, rinse and  pat dry prior to dressing wounds Peri-Wound Care Vora, Urban L. ([id number]) Triamcinolone Acetonide Cream, 0.1%, 15 (g) tube Discharge Instruction: In wound clinic now Topical Clobetasol Propionate ointment 0.05%, 60 (g) tube Discharge Instruction: Apply to wound bed and around wound Primary Dressing Hydrofera Blue Ready Transfer Foam, 2.5x2.5 (in/in) Discharge Instruction: Apply Hydrofera Blue Ready to wound bed as directed Secondary Dressing Bordered Gauze Sterile-HBD 4x4 (in/in) Discharge Instruction: Cover wound with Bordered Guaze Sterile as directed Secured With Compression Wrap Compression Stockings Add-Ons Electronic Signature(s) Signed: 03/30/2021 4:41:19 PM By: Dolan Amen RN Entered By: Dolan Amen on 03/30/2021 09:23:15 Yoo, Kaushal L. ([id number]) -------------------------------------------------------------------------------- [place] Details Patient Name: [name] Date of Service: 03/30/2021 9:00 AM Medical Record Number: [id number] Patient Account Number: [id number] Date of Birth/Sex: 10/19/1970 (50 y.o. M) Treating RN: Dolan Amen Primary Care Dany Harten: PATIENT, NO Other Clinician: Referring Niema Carrara: Barkley Boards Treating Richie Bonanno/Extender: Suella Grove in Treatment: 11 Vital Signs Time Taken: 09:18 Temperature (F): 98.8 Height (in): 78 Pulse (bpm): 72 Weight (lbs): 249 Respiratory Rate (breaths/min): 18 Body Mass Index (BMI): 28.8 Blood Pressure (mmHg): 126/77 Reference Range: 80 - 120 mg / dl Electronic Signature(s) Signed: 03/30/2021 4:41:19 PM By: Dolan Amen RN Entered By: Dolan Amen on 03/30/2021 09:18:42

## 2021-03-30 NOTE — Progress Notes (Addendum)
YASMIN, ZUCCA ([id number]) Visit Report for 03/30/2021 Chief Complaint Document Details Patient Name: [name], [name]. Date of Service: 03/30/2021 9:00 AM Medical Record Number: [id number] Patient Account Number: [id number] Date of Birth/Sex: 1971-04-25 (50 y.o. M) Treating RN: Dolan Amen Primary Care Axavier Pressley: PATIENT, NO Other Clinician: Referring Jacalynn Buzzell: Barkley Boards Treating Danaka Llera/Extender: Suella Grove in Treatment: 11 Information Obtained from: Patient Chief Complaint Right LE Ulcer Electronic Signature(s) Signed: 03/30/2021 9:49:04 AM By: Worthy Keeler PA-C Entered By: Worthy Keeler on 03/30/2021 09:49:04 [place], [place] ([id number]) -------------------------------------------------------------------------------- HPI Details Patient Name: Nicolas Cousin L. Date of Service: 03/30/2021 9:00 AM Medical Record Number: [id number] Patient Account Number: [id number] Date of Birth/Sex: 1971-02-21 (50 y.o. M) Treating RN: Dolan Amen Primary Care Toddy Boyd: PATIENT, NO Other Clinician: Referring Neida Ellegood: Barkley Boards Treating Amjad Fikes/Extender: Suella Grove in Treatment: 11 History of Present Illness HPI Description: 01/12/2021 upon evaluation today patient appears to be doing somewhat poorly in regard to a wound on his right medial lower leg. Currently he tells me that this has been going on since around the beginning of April. He has done a telehealth visit with a physician who initially gave him a triamcinolone cream along with Keflex for 5 days. Subsequently he ended up being seen at walk-in urgent care where they also given Keflex for 7 days that seem to be doing better for him. Overall he tells me that things have improved from the standpoint of redness. Fortunately there does not appear to be any signs of systemic infection to be honest right now even locally I do not see any evidence of infection right now. He does have 1 main wound with some scattering areas that look like they may have  been small ulcerations but again for the most part these have filled in. This does look almost to be more of a vasculitis type scenario based on what I am seeing. Patient does have chronic venous insufficiency but is not wearing any compression even though it sounds like he has been told before he should. 5/27; patient with small wounds on his right medial ankle. He has chronic venous insufficiency and stasis dermatitis a large open wound and several smaller areas all of which looks better than on presentation last week according to her nursing staff 01/26/2021 upon evaluation today patient appears to be doing well with regard to his wound. He is making good progress. With that being said there is some need currently for sharp debridement in regard to the wound there is some necrotic debris it is almost completely cleaned off but not completely. He is in agreement with that plan today. Fortunately there does not appear to be any evidence of active infection which is great and I am very pleased in that regard. He still wishes he did not have to wear the compression wrap. He did get his compression socks from elastic therapy though they are in the 15 to 20 mmHg range they did not have any his length as he is a tall guy in the 20 to 30 mmHg range. 02/02/2021 upon evaluation today patient appears to be doing excellent in regard to his leg ulcer. He has been tolerating the dressing changes without complication. Fortunately there is no evidence of active infection at this time. No fevers, chills, nausea, vomiting, or diarrhea. 02/09/2021 upon evaluation today patient's wound actually showing signs of improvement. Fortunately there does not appear to be any evidence of active infection which is great news overall very pleased with where things stand today. 02/16/2021  upon evaluation today patient's wound is showing some signs of improvement. He did have a fairly aggressive debridement last week he notes that he  had quite a bit of discomfort he was some swelling following. With that being said I think this was to be expected considering what we had to do from a debridement standpoint. The good news is there does not appear to be any evidence of infection currently nonetheless I do believe that the patient may have some signs of vasculitis here. Based on the overall appearance of the wound today and beginning to suspect this. For that reason I think he would benefit from starting him on triamcinolone ointment to the wound bed and some of the immediate periwound to try to help out in this regard. 02/23/2021 upon evaluation today patient appears to be doing well with regard to his wound. He is actually making some progress here. The wound is roughly about the same size but the overall appearance of the wound bed is much improved. I do not see any signs of active infection at this time which is great news. No fevers, chills, nausea, vomiting, or diarrhea. 03/02/2021 upon evaluation today patient appears to be doing well with regard to his wound. I think the biggest issue I see right now is that with Hydrofera Blue this wound is staying somewhat dry. I think he could benefit from the use of collagen to try to help with getting this to stimulate some additional growth. He is in agreement with the plan. I think this also had a little bit more moisture which would be helpful at this point. 03/09/2021 on evaluation today patient appears to be doing about the same in regard to his wound is measuring a little bit smaller but still he does have the surface of the wound which is not quite as healthy as I would like to see. Fortunately there does not appear to be any active infection at this time. No fevers, chills, nausea, vomiting, or diarrhea. 03/16/2021 upon evaluation today patient appears to be doing better with regard to the overall appearance of his wound bed. There does not appear to be any signs of significant slough  buildup I think the Iodoflex has done very well for him. The patient's happy to hear this and see the improvement. With that being said he is continuing to have a little bit of expansion of the wound again I think this is more related to #1 edema control and #2 the fact that again we will get the wound bed clean. I feel like were pretty much today as far as cleaning up the wound bed is concerned now we just need to make sure the edema is optimized. I am just not certain that his compression socks are doing the job. 03/23/2021 upon evaluation today patient unfortunately appears to be doing a little bit worse with regard to his wound I was actually expecting this to be much better this week. With that being said I am getting concerned about the possibility of pyoderma gangrenosum. I did print off some information for the patient in this regard. Subsequently I am also can see about starting him on some steroid cream as well as an oral steroid. 03/30/2021 upon evaluation today patient appears to be doing significantly better in regard to his wound. He has started using the clobetasol which seems to have made a excellent improvement in regard to the wound today. 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 Either way at this point I would recommend that we avoid sharp debridements I think that is probably can be the best option based on what I am seeing currently. The patient voiced understanding. He also had questions about taking the prednisone with regard to his immune system. I explained that I do believe that that can be an issue long-term but for short-term which is mainly what we are doing here I do not think it is going to be a major issue for him and in fact I think would help more especially considering the pyoderma here and the fact  that is more of an autoimmune type issue than not using the prednisone. Electronic Signature(s) GIBRAN, KLIETHERMES ([id number]) Signed: 03/30/2021 6:08:36 PM By: Worthy Keeler PA-C Entered By: Worthy Keeler on 03/30/2021 18:08:36 Cletus Gash, [place] Carlean Jews ([id number]) -------------------------------------------------------------------------------- Physical Exam Details Patient Name: [name], [name] Date of Service: 03/30/2021 9:00 AM Medical Record Number: [id number] Patient Account Number: [id number] Date of Birth/Sex: [birth year] (50 y.o. M) Treating RN: Dolan Amen Primary Care Destyn Parfitt: PATIENT, NO Other Clinician: Referring Lucindia Lemley: Barkley Boards Treating Antara Brecheisen/Extender: Suella Grove in Treatment: 7 Constitutional Well-nourished and well-hydrated in no acute distress. Respiratory normal breathing without difficulty. Psychiatric this patient is able to make decisions and demonstrates good insight into disease process. Alert and Oriented x 3. pleasant and cooperative. Notes Upon inspection patient's wound bed showed signs of significant improvement and I am actually extremely pleased with where things stand today. I do not see any evidence whatsoever of active infection at this time. Overall the wound is significantly smaller today. Electronic Signature(s) Signed: 03/30/2021 6:08:51 PM By: Worthy Keeler PA-C Entered By: Worthy Keeler on 03/30/2021 18:08:51 [name] ([id number]) -------------------------------------------------------------------------------- Physician Orders Details Patient Name: [name], [name] Date of Service: 03/30/2021 9:00 AM Medical Record Number: [id number] Patient Account Number: [id number] Date of Birth/Sex: [birth year] (50 y.o. M) Treating RN: Dolan Amen Primary Care Chequita Mofield: PATIENT, NO Other Clinician: Referring Xzavior Reinig: Barkley Boards Treating Bobbi Kozakiewicz/Extender: Suella Grove in Treatment: 11 Verbal / Phone Orders: No Diagnosis Coding ICD-10 Coding Code  Description I87.2 Venous insufficiency (chronic) (peripheral) L97.812 Non-pressure chronic ulcer of other part of right lower leg with fat layer exposed Follow-up Appointments o Return Appointment in 1 week. Bathing/ Shower/ Hygiene o May shower with wound dressing protected with water repellent cover or cast protector. Edema Control - Lymphedema / Segmental Compressive Device / Other Bilateral Lower Extremities o Patient to wear own compression stockings. Remove compression stockings every night before going to bed and put on every morning when getting up. o Elevate, Exercise Daily and Avoid Standing for Long Periods of Time. o Elevate legs to the level of the heart and pump ankles as often as possible o Elevate leg(s) parallel to the floor when sitting. Wound Treatment Wound #1 - Lower Leg Wound Laterality: Right, Medial Cleanser: Soap and Water 1 x Per Day/30 Days Discharge Instructions: Gently cleanse wound with antibacterial soap, rinse and pat dry prior to dressing wounds Peri-Wound Care: Triamcinolone Acetonide Cream, 0.1%, 15 (g) tube 1 x Per Day/30 Days Discharge Instructions: In wound clinic now Topical: Clobetasol Propionate ointment 0.05%, 60 (g) tube 1 x Per Day/30 Days Discharge Instructions: Apply to wound bed and around wound Primary Dressing: Hydrofera Blue Ready Transfer Foam, 2.5x2.5 (in/in) 1 x Per Day/30 Days Discharge Instructions: Apply  Hydrofera Blue Ready to wound bed as directed Secondary Dressing: Bordered Gauze Sterile-HBD 4x4 (in/in) 1 x Per Day/30 Days Discharge Instructions: Cover wound with Bordered Guaze Sterile as directed Electronic Signature(s) Signed: 03/30/2021 4:41:19 PM By: Dolan Amen RN Entered By: Dolan Amen on 03/30/2021 09:58:03 [place], [place] ([id number]) -------------------------------------------------------------------------------- Problem List Details Patient Name: [name], [name] Date of Service: 03/30/2021 9:00  AM Medical Record Number: [id number] Patient Account Number: [id number] Date of Birth/Sex: 06-Jun-1971 (51 y.o. M) Treating RN: Dolan Amen Primary Care Dameir Gentzler: PATIENT, NO Other Clinician: Referring Shemar Plemmons: Barkley Boards Treating Dallon Dacosta/Extender: Suella Grove in Treatment: 11 Active Problems ICD-10 Encounter Code Description Active Date MDM Diagnosis I87.2 Venous insufficiency (chronic) (peripheral) 01/12/2021 No Yes L97.812 Non-pressure chronic ulcer of other part of right lower leg with fat layer 01/12/2021 No Yes exposed Inactive Problems Resolved Problems Electronic Signature(s) Signed: 03/30/2021 9:48:38 AM By: Worthy Keeler PA-C Entered By: Worthy Keeler on 03/30/2021 09:48:38 [name], [name] ([id number]) -------------------------------------------------------------------------------- Progress Note Details Patient Name: Nicolas Cousin L. Date of Service: 03/30/2021 9:00 AM Medical Record Number: [id number] Patient Account Number: [id number] Date of Birth/Sex: 1971-04-19 (50 y.o. M) Treating RN: Dolan Amen Primary Care Iasia Forcier: PATIENT, NO Other Clinician: Referring Averil Digman: Barkley Boards Treating Jaizon Deroos/Extender: Suella Grove in Treatment: 11 Subjective Chief Complaint Information obtained from Patient Right LE Ulcer History of Present Illness (HPI) 01/12/2021 upon evaluation today patient appears to be doing somewhat poorly in regard to a wound on his right medial lower leg. Currently he tells me that this has been going on since around the beginning of April. He has done a telehealth visit with a physician who initially gave him a triamcinolone cream along with Keflex for 5 days. Subsequently he ended up being seen at walk-in urgent care where they also given Keflex for 7 days that seem to be doing better for him. Overall he tells me that things have improved from the standpoint of redness. Fortunately there does not appear to be any signs of systemic infection to be honest  right now even locally I do not see any evidence of infection right now. He does have 1 main wound with some scattering areas that look like they may have been small ulcerations but again for the most part these have filled in. This does look almost to be more of a vasculitis type scenario based on what I am seeing. Patient does have chronic venous insufficiency but is not wearing any compression even though it sounds like he has been told before he should. 5/27; patient with small wounds on his right medial ankle. He has chronic venous insufficiency and stasis dermatitis a large open wound and several smaller areas all of which looks better than on presentation last week according to her nursing staff 01/26/2021 upon evaluation today patient appears to be doing well with regard to his wound. He is making good progress. With that being said there is some need currently for sharp debridement in regard to the wound there is some necrotic debris it is almost completely cleaned off but not completely. He is in agreement with that plan today. Fortunately there does not appear to be any evidence of active infection which is great and I am very pleased in that regard. He still wishes he did not have to wear the compression wrap. He did get his compression socks from elastic therapy though they are in the 15 to 20 mmHg range they did not have any his length as he is a tall  guy in the 20 to 30 mmHg range. 02/02/2021 upon evaluation today patient appears to be doing excellent in regard to his leg ulcer. He has been tolerating the dressing changes without complication. Fortunately there is no evidence of active infection at this time. No fevers, chills, nausea, vomiting, or diarrhea. 02/09/2021 upon evaluation today patient's wound actually showing signs of improvement. Fortunately there does not appear to be any evidence of active infection which is great news overall very pleased with where things stand  today. 02/16/2021 upon evaluation today patient's wound is showing some signs of improvement. He did have a fairly aggressive debridement last week he notes that he had quite a bit of discomfort he was some swelling following. With that being said I think this was to be expected considering what we had to do from a debridement standpoint. The good news is there does not appear to be any evidence of infection currently nonetheless I do believe that the patient may have some signs of vasculitis here. Based on the overall appearance of the wound today and beginning to suspect this. For that reason I think he would benefit from starting him on triamcinolone ointment to the wound bed and some of the immediate periwound to try to help out in this regard. 02/23/2021 upon evaluation today patient appears to be doing well with regard to his wound. He is actually making some progress here. The wound is roughly about the same size but the overall appearance of the wound bed is much improved. I do not see any signs of active infection at this time which is great news. No fevers, chills, nausea, vomiting, or diarrhea. 03/02/2021 upon evaluation today patient appears to be doing well with regard to his wound. I think the biggest issue I see right now is that with Hydrofera Blue this wound is staying somewhat dry. I think he could benefit from the use of collagen to try to help with getting this to stimulate some additional growth. He is in agreement with the plan. I think this also had a little bit more moisture which would be helpful at this point. 03/09/2021 on evaluation today patient appears to be doing about the same in regard to his wound is measuring a little bit smaller but still he does have the surface of the wound which is not quite as healthy as I would like to see. Fortunately there does not appear to be any active infection at this time. No fevers, chills, nausea, vomiting, or diarrhea. 03/16/2021 upon  evaluation today patient appears to be doing better with regard to the overall appearance of his wound bed. There does not appear to be any signs of significant slough buildup I think the Iodoflex has done very well for him. The patient's happy to hear this and see the improvement. With that being said he is continuing to have a little bit of expansion of the wound again I think this is more related to #1 edema control and #2 the fact that again we will get the wound bed clean. I feel like were pretty much today as far as cleaning up the wound bed is concerned now we just need to make sure the edema is optimized. I am just not certain that his compression socks are doing the job. 03/23/2021 upon evaluation today patient unfortunately appears to be doing a little bit worse with regard to his wound I was actually expecting this to be much better this week. With that being said I am  getting concerned about the possibility of pyoderma gangrenosum. I did print off some information for the patient in this regard. Subsequently I am also can see about starting him on some steroid cream as well as an oral steroid. 03/30/2021 upon evaluation today patient appears to be doing significantly better in regard to his wound. He has started using the clobetasol which seems to have made a excellent improvement in regard to the wound today. 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 Either way at this point I would recommend that we avoid sharp debridements I think that is probably can be the best option based on what I am seeing currently. The patient voiced understanding. He also had questions about taking the prednisone with regard to his immune system. I explained that I do believe that that can be an issue long-term but for short-term which is mainly  what we are doing here I do not think it is going to be a major issue for him and in fact I think would help more especially considering the pyoderma here and the fact that is more of an [place], [place] ([id number]) autoimmune type issue than not using the prednisone. Objective Constitutional Well-nourished and well-hydrated in no acute distress. Vitals Time Taken: 9:18 AM, Height: 78 in, Weight: 249 lbs, BMI: 28.8, Temperature: 98.8 F, Pulse: 72 bpm, Respiratory Rate: 18 breaths/min, Blood Pressure: 126/77 mmHg. Respiratory normal breathing without difficulty. Psychiatric this patient is able to make decisions and demonstrates good insight into disease process. Alert and Oriented x 3. pleasant and cooperative. General Notes: Upon inspection patient's wound bed showed signs of significant improvement and I am actually extremely pleased with where things stand today. I do not see any evidence whatsoever of active infection at this time. Overall the wound is significantly smaller today. Integumentary (Hair, Skin) Wound #1 status is Open. Original cause of wound was Gradually Appeared. The date acquired was: 11/24/2020. The wound has been in treatment 11 weeks. The wound is located on the Right,Medial Lower Leg. The wound measures 1.2cm length x 1.4cm width x 0.2cm depth; 1.319cm^2 area and 0.264cm^3 volume. There is Fat Layer (Subcutaneous Tissue) exposed. There is no tunneling or undermining noted. There is a medium amount of serosanguineous drainage noted. The wound margin is flat and intact. There is medium (34-66%) red granulation within the wound bed. There is a medium (34-66%) amount of necrotic tissue within the wound bed including Adherent Slough. Assessment Active Problems ICD-10 Venous insufficiency (chronic) (peripheral) Non-pressure chronic ulcer of other part of right lower leg with fat layer exposed Plan Follow-up Appointments: Return Appointment in 1 week. Bathing/ Shower/  Hygiene: May shower with wound dressing protected with water repellent cover or cast protector. Edema Control - Lymphedema / Segmental Compressive Device / Other: Patient to wear own compression stockings. Remove compression stockings every night before going to bed and put on every morning when getting up. Elevate, Exercise Daily and Avoid Standing for Long Periods of Time. Elevate legs to the level of the heart and pump ankles as often as possible Elevate leg(s) parallel to the floor when sitting. WOUND #1: - Lower Leg Wound Laterality: Right, Medial Cleanser: Soap and Water 1 x Per Day/30 Days Discharge Instructions: Gently cleanse wound with antibacterial soap, rinse and pat dry prior to dressing wounds Peri-Wound  Care: Triamcinolone Acetonide Cream, 0.1%, 15 (g) tube 1 x Per Day/30 Days Discharge Instructions: In wound clinic now Topical: Clobetasol Propionate ointment 0.05%, 60 (g) tube 1 x Per Day/30 Days Discharge Instructions: Apply to wound bed and around wound Primary Dressing: Hydrofera Blue Ready Transfer Foam, 2.5x2.5 (in/in) 1 x Per Day/30 Days Discharge Instructions: Apply Hydrofera Blue Ready to wound bed as directed Secondary Dressing: Bordered Gauze Sterile-HBD 4x4 (in/in) 1 x Per Day/30 Days Discharge Instructions: Cover wound with Bordered Guaze Sterile as directed [place], [place] ([id number]) 1. Would recommend currently that we going to continue with wound care measures as before and the patient is in agreement with the plan this includes the use of the [facility] dressing which I think is doing a great job. 2. Also to continue with the clobetasol underneath and a thin film. 3. I am also going to recommend that we have the patient continue with the border foam dressing to cover and then subsequently he is using his 20-30 mmHg compression socks which are doing excellent for him. We will see patient back for reevaluation in 1 week here in the clinic. If anything  worsens or changes patient will contact our office for additional recommendations. Electronic Signature(s) Signed: 03/30/2021 6:09:29 PM By: Worthy Keeler PA-C Entered By: Worthy Keeler on 03/30/2021 18:09:29 [name] ([id number]) -------------------------------------------------------------------------------- SuperBill Details Patient Name: Nicolas Cousin L. Date of Service: 03/30/2021 Medical Record Number: [id number] Patient Account Number: [id number] Date of Birth/Sex: 01-03-1971 (50 y.o. M) Treating RN: Dolan Amen Primary Care Taunia Frasco: PATIENT, NO Other Clinician: Referring Lyah Millirons: Barkley Boards Treating Charleigh Correnti/Extender: [place] in Treatment: 11 Diagnosis Coding ICD-10 Codes Code Description I87.2 Venous insufficiency (chronic) (peripheral) L97.812 Non-pressure chronic ulcer of other part of right lower leg with fat layer exposed Facility Procedures CPT4 Code: [id number] Description: [place] VISIT-LEV 3 EST PT Modifier: Quantity: 1 Physician Procedures CPT4 Code: [id number] Description: [id number] - WC PHYS LEVEL 4 - EST PT Modifier: Quantity: 1 CPT4 Code: Description: ICD-10 Diagnosis Description I87.2 Venous insufficiency (chronic) (peripheral) 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

## 2021-04-06 ENCOUNTER — Encounter: Payer: Self-pay | Admitting: Physician Assistant

## 2021-04-06 ENCOUNTER — Other Ambulatory Visit: Payer: Self-pay

## 2021-04-06 NOTE — Progress Notes (Addendum)
JAHAD, KHATOON ([id number]) Visit Report for 04/06/2021 Chief Complaint Document Details Patient Name: [name], [name]. Date of Service: 04/06/2021 9:00 AM Medical Record Number: [id number] Patient Account Number: [id number] Date of Birth/Sex: 05-Aug-1971 (50 y.o. M) Treating RN: Dolan Amen Primary Care Provider: PATIENT, NO Other Clinician: Referring Provider: Barkley Boards Treating Provider/Extender: Skipper Cliche in Treatment: 12 Information Obtained from: Patient Chief Complaint Right LE Ulcer Electronic Signature(s) Signed: 04/06/2021 9:08:07 AM By: Worthy Keeler PA-C Entered By: Worthy Keeler on 04/06/2021 09:08:06 [place], [place] ([id number]) -------------------------------------------------------------------------------- HPI Details Patient Name: [name] L. Date of Service: 04/06/2021 9:00 AM Medical Record Number: [id number] Patient Account Number: [id number] Date of Birth/Sex: 12/18/1970 (50 y.o. M) Treating RN: Dolan Amen Primary Care Provider: PATIENT, NO Other Clinician: Referring Provider: Barkley Boards Treating Provider/Extender: Skipper Cliche in Treatment: 12 History of Present Illness HPI Description: 01/12/2021 upon evaluation today patient appears to be doing somewhat poorly in regard to a wound on his right medial lower leg. Currently he tells me that this has been going on since around the beginning of April. He has done a telehealth visit with a physician who initially gave him a triamcinolone cream along with Keflex for 5 days. Subsequently he ended up being seen at walk-in urgent care where they also given Keflex for 7 days that seem to be doing better for him. Overall he tells me that things have improved from the standpoint of redness. Fortunately there does not appear to be any signs of systemic infection to be honest right now even locally I do not see any evidence of infection right now. He does have 1 main wound with some scattering areas  that look like they may have been small ulcerations but again for the most part these have filled in. This does look almost to be more of a vasculitis type scenario based on what I am seeing. Patient does have chronic venous insufficiency but is not wearing any compression even though it sounds like he has been told before he should. 5/27; patient with small wounds on his right medial ankle. He has chronic venous insufficiency and stasis dermatitis a large open wound and several smaller areas all of which looks better than on presentation last week according to her nursing staff 01/26/2021 upon evaluation today patient appears to be doing well with regard to his wound. He is making good progress. With that being said there is some need currently for sharp debridement in regard to the wound there is some necrotic debris it is almost completely cleaned off but not completely. He is in agreement with that plan today. Fortunately there does not appear to be any evidence of active infection which is great and I am very pleased in that regard. He still wishes he did not have to wear the compression wrap. He did get his compression socks from elastic therapy though they are in the 15 to 20 mmHg range they did not have any his length as he is a tall guy in the 20 to 30 mmHg range. 02/02/2021 upon evaluation today patient appears to be doing excellent in regard to his leg ulcer. He has been tolerating the dressing changes without complication. Fortunately there is no evidence of active infection at this time. No fevers, chills, nausea, vomiting, or diarrhea. 02/09/2021 upon evaluation today patient's wound actually showing signs of improvement. Fortunately there does not appear to be any evidence of active infection which is great news overall very pleased with where  things stand today. 02/16/2021 upon evaluation today patient's wound is showing some signs of improvement. He did have a fairly aggressive debridement  last week he notes that he had quite a bit of discomfort he was some swelling following. With that being said I think this was to be expected considering what we had to do from a debridement standpoint. The good news is there does not appear to be any evidence of infection currently nonetheless I do believe that the patient may have some signs of vasculitis here. Based on the overall appearance of the wound today and beginning to suspect this. For that reason I think he would benefit from starting him on triamcinolone ointment to the wound bed and some of the immediate periwound to try to help out in this regard. 02/23/2021 upon evaluation today patient appears to be doing well with regard to his wound. He is actually making some progress here. The wound is roughly about the same size but the overall appearance of the wound bed is much improved. I do not see any signs of active infection at this time which is great news. No fevers, chills, nausea, vomiting, or diarrhea. 03/02/2021 upon evaluation today patient appears to be doing well with regard to his wound. I think the biggest issue I see right now is that with Hydrofera Blue this wound is staying somewhat dry. I think he could benefit from the use of collagen to try to help with getting this to stimulate some additional growth. He is in agreement with the plan. I think this also had a little bit more moisture which would be helpful at this point. 03/09/2021 on evaluation today patient appears to be doing about the same in regard to his wound is measuring a little bit smaller but still he does have the surface of the wound which is not quite as healthy as I would like to see. Fortunately there does not appear to be any active infection at this time. No fevers, chills, nausea, vomiting, or diarrhea. 03/16/2021 upon evaluation today patient appears to be doing better with regard to the overall appearance of his wound bed. There does not appear to be any  signs of significant slough buildup I think the Iodoflex has done very well for him. The patient's happy to hear this and see the improvement. With that being said he is continuing to have a little bit of expansion of the wound again I think this is more related to #1 edema control and #2 the fact that again we will get the wound bed clean. I feel like were pretty much today as far as cleaning up the wound bed is concerned now we just need to make sure the edema is optimized. I am just not certain that his compression socks are doing the job. 03/23/2021 upon evaluation today patient unfortunately appears to be doing a little bit worse with regard to his wound I was actually expecting this to be much better this week. With that being said I am getting concerned about the possibility of pyoderma gangrenosum. I did print off some information for the patient in this regard. Subsequently I am also can see about starting him on some steroid cream as well as an oral steroid. 03/30/2021 upon evaluation today patient appears to be doing significantly better in regard to his wound. He has started using the clobetasol which seems to have made a excellent improvement in regard to the wound today. 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 Either way at this point I would recommend that we avoid sharp debridements I think that is probably can be the best option based on what I am seeing currently. The patient voiced understanding. He also had questions about taking the prednisone with regard to his immune system. I explained that I do believe that that can be an issue long-term but for short-term which is mainly what we are doing here I do not think it is going to be a major issue for him and in fact I think would help more especially considering  the pyoderma here and the fact that is more of an autoimmune type issue than not using the prednisone. 04/06/2021 upon evaluation today patient's wound actually showed signs of excellent improvement. This definitely has gone a lot better since we started with the topical steroids and overall I am extremely pleased with where things stand. No fevers, chills, nausea, vomiting, or diarrhea. With [place], [place] ([id number]) that being said the patient continues to feel much better and states that the pain is actually dramatically improved as well. 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 Electronic Signature(s) Signed: 04/06/2021 10:11:37 AM By: Worthy Keeler PA-C Entered By: Worthy Keeler on 04/06/2021 10:11:36 [name] ([id number]) -------------------------------------------------------------------------------- Physical Exam Details Patient Name: [name], [name] Date of Service: 04/06/2021 9:00 AM Medical Record Number: [id number] Patient Account Number: [id number] Date of Birth/Sex: 31-Dec-1970 (50 y.o. M) Treating RN: Dolan Amen Primary Care Provider: PATIENT, NO Other Clinician: Referring Provider: Barkley Boards Treating Provider/Extender: Skipper Cliche in Treatment: 42 Constitutional Well-nourished and well-hydrated in no acute distress. Respiratory normal breathing without difficulty. Psychiatric this patient is able to make decisions and demonstrates good insight into disease process. Alert and Oriented x 3. pleasant and cooperative. Notes Upon inspection patient's wound bed actually appears to be doing quite nicely at this point with regard to the surface. There may be a little bit of slough but to be honest with this. Pyoderma I am definitely not to perform any sharp debridement. Overall I think that he is making great progress. Electronic Signature(s) Signed: 04/06/2021 10:12:19 AM By: Worthy Keeler PA-C Entered By: Worthy Keeler on 04/06/2021  10:12:19 [name] ([id number]) -------------------------------------------------------------------------------- Physician Orders Details Patient Name: [name], [name] Date of Service: 04/06/2021 9:00 AM Medical Record Number: [id number] Patient Account Number: [id number] Date of Birth/Sex: 1971-01-19 (50 y.o. M) Treating RN: Dolan Amen Primary Care Provider: PATIENT, NO Other Clinician: Referring Provider: Barkley Boards Treating Provider/Extender: Skipper Cliche in Treatment: 12 Verbal / Phone Orders: No Diagnosis Coding ICD-10 Coding Code Description I87.2 Venous insufficiency (chronic) (peripheral) L97.812 Non-pressure chronic ulcer of other part of right lower leg with fat layer exposed Follow-up Appointments o Return Appointment in 1 week. Bathing/ Shower/ Hygiene o May shower with wound dressing protected with water repellent cover or cast protector. Edema Control - Lymphedema / Segmental Compressive Device / Other Bilateral Lower Extremities o Patient to wear own compression stockings. Remove compression stockings every night before going to bed and put on every morning when getting up. o Elevate, Exercise Daily and Avoid Standing for Long Periods of Time. o Elevate legs to the level of the heart and pump ankles as often as possible o Elevate leg(s) parallel to the floor when sitting. Wound Treatment  Wound #1 - Lower Leg Wound Laterality: Right, Medial Cleanser: Soap and Water 1 x Per Day/30 Days Discharge Instructions: Gently cleanse wound with antibacterial soap, rinse and pat dry prior to dressing wounds Peri-Wound Care: Triamcinolone Acetonide Cream, 0.1%, 15 (g) tube 1 x Per Day/30 Days Discharge Instructions: In wound clinic now Topical: Clobetasol Propionate ointment 0.05%, 60 (g) tube 1 x Per Day/30 Days Discharge Instructions: Apply to wound bed and around wound Primary Dressing: Hydrofera Blue Ready Transfer Foam, 2.5x2.5 (in/in) 1 x Per  Day/30 Days Discharge Instructions: Apply Hydrofera Blue Ready to wound bed as directed Secondary Dressing: Bordered Gauze Sterile-HBD 4x4 (in/in) 1 x Per Day/30 Days Discharge Instructions: Cover wound with Bordered Guaze Sterile as directed Electronic Signature(s) Signed: 04/06/2021 5:03:01 PM By: Dolan Amen RN Signed: 04/06/2021 5:54:54 PM By: Worthy Keeler PA-C Entered By: Dolan Amen on 04/06/2021 09:58:29 [place], [place] ([id number]) -------------------------------------------------------------------------------- Problem List Details Patient Name: [name], [name] Date of Service: 04/06/2021 9:00 AM Medical Record Number: [id number] Patient Account Number: [id number] Date of Birth/Sex: [birth year] (50 y.o. M) Treating RN: Dolan Amen Primary Care Provider: PATIENT, NO Other Clinician: Referring Provider: Barkley Boards Treating Provider/Extender: Skipper Cliche in Treatment: 12 Active Problems ICD-10 Encounter Code Description Active Date MDM Diagnosis I87.2 Venous insufficiency (chronic) (peripheral) 01/12/2021 No Yes L97.812 Non-pressure chronic ulcer of other part of right lower leg with fat layer 01/12/2021 No Yes exposed Inactive Problems Resolved Problems Electronic Signature(s) Signed: 04/06/2021 9:07:56 AM By: Worthy Keeler PA-C Entered By: Worthy Keeler on 04/06/2021 09:07:56 [place], [name] ([id number]) -------------------------------------------------------------------------------- Progress Note Details Patient Name: [name] L. Date of Service: 04/06/2021 9:00 AM Medical Record Number: [id number] Patient Account Number: [id number] Date of Birth/Sex: [birth year] (50 y.o. M) Treating RN: Dolan Amen Primary Care Provider: PATIENT, NO Other Clinician: Referring Provider: Barkley Boards Treating Provider/Extender: Skipper Cliche in Treatment: 12 Subjective Chief Complaint Information obtained from Patient Right LE Ulcer History of Present  Illness (HPI) 01/12/2021 upon evaluation today patient appears to be doing somewhat poorly in regard to a wound on his right medial lower leg. Currently he tells me that this has been going on since around the beginning of April. He has done a telehealth visit with a physician who initially gave him a triamcinolone cream along with Keflex for 5 days. Subsequently he ended up being seen at walk-in urgent care where they also given Keflex for 7 days that seem to be doing better for him. Overall he tells me that things have improved from the standpoint of redness. Fortunately there does not appear to be any signs of systemic infection to be honest right now even locally I do not see any evidence of infection right now. He does have 1 main wound with some scattering areas that look like they may have been small ulcerations but again for the most part these have filled in. This does look almost to be more of a vasculitis type scenario based on what I am seeing. Patient does have chronic venous insufficiency but is not wearing any compression even though it sounds like he has been told before he should. 5/27; patient with small wounds on his right medial ankle. He has chronic venous insufficiency and stasis dermatitis a large open wound and several smaller areas all of which looks better than on presentation last week according to her nursing staff 01/26/2021 upon evaluation today patient appears to be doing well with regard to his wound. He is making good  progress. With that being said there is some need currently for sharp debridement in regard to the wound there is some necrotic debris it is almost completely cleaned off but not completely. He is in agreement with that plan today. Fortunately there does not appear to be any evidence of active infection which is great and I am very pleased in that regard. He still wishes he did not have to wear the compression wrap. He did get his compression socks from  elastic therapy though they are in the 15 to 20 mmHg range they did not have any his length as he is a tall guy in the 20 to 30 mmHg range. 02/02/2021 upon evaluation today patient appears to be doing excellent in regard to his leg ulcer. He has been tolerating the dressing changes without complication. Fortunately there is no evidence of active infection at this time. No fevers, chills, nausea, vomiting, or diarrhea. 02/09/2021 upon evaluation today patient's wound actually showing signs of improvement. Fortunately there does not appear to be any evidence of active infection which is great news overall very pleased with where things stand today. 02/16/2021 upon evaluation today patient's wound is showing some signs of improvement. He did have a fairly aggressive debridement last week he notes that he had quite a bit of discomfort he was some swelling following. With that being said I think this was to be expected considering what we had to do from a debridement standpoint. The good news is there does not appear to be any evidence of infection currently nonetheless I do believe that the patient may have some signs of vasculitis here. Based on the overall appearance of the wound today and beginning to suspect this. For that reason I think he would benefit from starting him on triamcinolone ointment to the wound bed and some of the immediate periwound to try to help out in this regard. 02/23/2021 upon evaluation today patient appears to be doing well with regard to his wound. He is actually making some progress here. The wound is roughly about the same size but the overall appearance of the wound bed is much improved. I do not see any signs of active infection at this time which is great news. No fevers, chills, nausea, vomiting, or diarrhea. 03/02/2021 upon evaluation today patient appears to be doing well with regard to his wound. I think the biggest issue I see right now is that with Hydrofera Blue this  wound is staying somewhat dry. I think he could benefit from the use of collagen to try to help with getting this to stimulate some additional growth. He is in agreement with the plan. I think this also had a little bit more moisture which would be helpful at this point. 03/09/2021 on evaluation today patient appears to be doing about the same in regard to his wound is measuring a little bit smaller but still he does have the surface of the wound which is not quite as healthy as I would like to see. Fortunately there does not appear to be any active infection at this time. No fevers, chills, nausea, vomiting, or diarrhea. 03/16/2021 upon evaluation today patient appears to be doing better with regard to the overall appearance of his wound bed. There does not appear to be any signs of significant slough buildup I think the Iodoflex has done very well for him. The patient's happy to hear this and see the improvement. With that being said he is continuing to have a little bit  of expansion of the wound again I think this is more related to #1 edema control and #2 the fact that again we will get the wound bed clean. I feel like were pretty much today as far as cleaning up the wound bed is concerned now we just need to make sure the edema is optimized. I am just not certain that his compression socks are doing the job. 03/23/2021 upon evaluation today patient unfortunately appears to be doing a little bit worse with regard to his wound I was actually expecting this to be much better this week. With that being said I am getting concerned about the possibility of pyoderma gangrenosum. I did print off some information for the patient in this regard. Subsequently I am also can see about starting him on some steroid cream as well as an oral steroid. 03/30/2021 upon evaluation today patient appears to be doing significantly better in regard to his wound. He has started using the clobetasol which seems to have made a  excellent improvement in regard to the wound today. 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 Either way at this point I would recommend that we avoid sharp debridements I think that is probably can be the best option based on what I am seeing currently. The patient voiced understanding. He also had questions about taking the prednisone with regard to his immune system. I explained that I do believe that that can be an issue long-term but for short-term which is mainly what we are doing here I do not think it is going to be a major issue for him and in fact I think would help more especially considering the pyoderma here and the fact that is more of an [place], [place] ([id number]) autoimmune type issue than not using the prednisone. 04/06/2021 upon evaluation today patient's wound actually showed signs of excellent improvement. This definitely has gone a lot better since we started with the topical steroids and overall I am extremely pleased with where things stand. No fevers, chills, nausea, vomiting, or diarrhea. With that being said the patient continues to feel much better and states that the pain is actually dramatically improved as well. 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 Objective Constitutional Well-nourished and well-hydrated in no acute distress. Vitals Time Taken: 9:15 AM, Height: 78 in, Weight: 249 lbs, BMI: 28.8, Temperature: 98.4 F, Pulse: 62 bpm, Respiratory Rate: 18 breaths/min, Blood Pressure: 150/89 mmHg. Respiratory normal breathing without difficulty. Psychiatric this patient is able to make decisions and demonstrates good insight into disease process. Alert and Oriented x 3. pleasant and cooperative. General Notes: Upon inspection patient's wound bed actually appears to be doing quite  nicely at this point with regard to the surface. There may be a little bit of slough but to be honest with this. Pyoderma I am definitely not to perform any sharp debridement. Overall I think that he is making great progress. Integumentary (Hair, Skin) Wound #1 status is Open. Original cause of wound was Gradually Appeared. The date acquired was: 11/24/2020. The wound has been in treatment 12 weeks. The wound is located on the Right,Medial Lower Leg. The wound measures 0.9cm length x 1.1cm width x 0.2cm depth; 0.778cm^2 area and 0.156cm^3 volume. There is Fat Layer (Subcutaneous Tissue) exposed. There is no tunneling or undermining  noted. There is a medium amount of serosanguineous drainage noted. The wound margin is flat and intact. There is medium (34-66%) red, pink granulation within the wound bed. There is a medium (34-66%) amount of necrotic tissue within the wound bed including Adherent Slough. Assessment Active Problems ICD-10 Venous insufficiency (chronic) (peripheral) Non-pressure chronic ulcer of other part of right lower leg with fat layer exposed Plan Follow-up Appointments: Return Appointment in 1 week. Bathing/ Shower/ Hygiene: May shower with wound dressing protected with water repellent cover or cast protector. Edema Control - Lymphedema / Segmental Compressive Device / Other: Patient to wear own compression stockings. Remove compression stockings every night before going to bed and put on every morning when getting up. Elevate, Exercise Daily and Avoid Standing for Long Periods of Time. Elevate legs to the level of the heart and pump ankles as often as possible Elevate leg(s) parallel to the floor when sitting. WOUND #1: - Lower Leg Wound Laterality: Right, Medial Cleanser: Soap and Water 1 x Per Day/30 Days Discharge Instructions: Gently cleanse wound with antibacterial soap, rinse and pat dry prior to dressing wounds Peri-Wound Care: Triamcinolone Acetonide Cream, 0.1%,  15 (g) tube 1 x Per Day/30 Days Discharge Instructions: In wound clinic now [place]RIST[place], [place]. ([id number]) Topical: Clobetasol Propionate ointment 0.05%, 60 (g) tube 1 x Per Day/30 Days Discharge Instructions: Apply to wound bed and around wound Primary Dressing: Hydrofera Blue Ready Transfer Foam, 2.5x2.5 (in/in) 1 x Per Day/30 Days Discharge Instructions: Apply Hydrofera Blue Ready to wound bed as directed Secondary Dressing: Bordered Gauze Sterile-HBD 4x4 (in/in) 1 x Per Day/30 Days Discharge Instructions: Cover wound with Bordered Guaze Sterile as directed 1. I am good recommend currently that we going continue with the wound care measures as before and the patient is in agreement with plan. This includes the use of the topical clobetasol which she is using at home and that seems to be doing great. 2. I am also can recommend that we have the patient continue with the [facility] which I think is doing a great job. 3. We are continuing with the border foam dressing as well. We will see patient back for reevaluation in 1 week here in the clinic. If anything worsens or changes patient will contact our office for additional recommendations. Electronic Signature(s) Signed: 04/06/2021 10:13:07 AM By: Worthy Keeler PA-C Entered By: Worthy Keeler on 04/06/2021 10:13:07 [name] ([id number]) -------------------------------------------------------------------------------- SuperBill Details Patient Name: [name] L. Date of Service: 04/06/2021 Medical Record Number: [id number] Patient Account Number: [id number] Date of Birth/Sex: Sep 23, 1970 (50 y.o. M) Treating RN: Dolan Amen Primary Care Provider: PATIENT, NO Other Clinician: Referring Provider: Barkley Boards Treating Provider/Extender: Skipper Cliche in Treatment: 12 Diagnosis Coding ICD-10 Codes Code Description I87.2 Venous insufficiency (chronic) (peripheral) L97.812 Non-pressure chronic ulcer of other part of right  lower leg with fat layer exposed Facility Procedures CPT4 Code: [id number] Description: [place] VISIT-LEV 3 EST PT Modifier: Quantity: 1 Physician Procedures CPT4 Code: [id number] Description: [id number] - WC PHYS LEVEL 4 - EST PT Modifier: Quantity: 1 CPT4 Code: Description: ICD-10 Diagnosis Description I87.2 Venous insufficiency (chronic) (peripheral) L97.812 Non-pressure chronic ulcer of other part of right lower leg with fat la Modifier: yer exposed Quantity: Electronic Signature(s) Signed: 04/06/2021 10:14:31 AM By: Worthy Keeler PA-C Entered By: Worthy Keeler on 04/06/2021 10:14:30

## 2021-04-13 ENCOUNTER — Other Ambulatory Visit: Payer: Self-pay

## 2021-04-13 ENCOUNTER — Encounter: Payer: Self-pay | Admitting: Physician Assistant

## 2021-04-13 NOTE — Progress Notes (Addendum)
Nicolas, [name] ([id number]) Visit Report for 04/13/2021 Chief Complaint Document Details Patient Name: [name], [name]. Date of Service: 04/13/2021 9:00 AM Medical Record Number: [id number] Patient Account Number: [id number] Date of Birth/Sex: Sep 09, 1970 (50 y.o. M) Treating RN: Dolan Amen Primary Care Provider: PATIENT, NO Other Clinician: Referring Provider: Barkley Boards Treating Provider/Extender: Skipper Cliche in Treatment: 13 Information Obtained from: Patient Chief Complaint Right LE Ulcer Electronic Signature(s) Signed: 04/13/2021 9:04:38 AM By: Worthy Keeler PA-C Entered By: Worthy Keeler on 04/13/2021 09:04:37 [place], [place] ([id number]) -------------------------------------------------------------------------------- HPI Details Patient Name: Nicolas Cousin L. Date of Service: 04/13/2021 9:00 AM Medical Record Number: [id number] Patient Account Number: [id number] Date of Birth/Sex: 01-Apr-1971 (50 y.o. M) Treating RN: Dolan Amen Primary Care Provider: PATIENT, NO Other Clinician: Referring Provider: Barkley Boards Treating Provider/Extender: Skipper Cliche in Treatment: 13 History of Present Illness HPI Description: 01/12/2021 upon evaluation today patient appears to be doing somewhat poorly in regard to a wound on his right medial lower leg. Currently he tells me that this has been going on since around the beginning of April. He has done a telehealth visit with a physician who initially gave him a triamcinolone cream along with Keflex for 5 days. Subsequently he ended up being seen at walk-in urgent care where they also given Keflex for 7 days that seem to be doing better for him. Overall he tells me that things have improved from the standpoint of redness. Fortunately there does not appear to be any signs of systemic infection to be honest right now even locally I do not see any evidence of infection right now. He does have 1 main wound with some scattering areas  that look like they may have been small ulcerations but again for the most part these have filled in. This does look almost to be more of a vasculitis type scenario based on what I am seeing. Patient does have chronic venous insufficiency but is not wearing any compression even though it sounds like he has been told before he should. 5/27; patient with small wounds on his right medial ankle. He has chronic venous insufficiency and stasis dermatitis a large open wound and several smaller areas all of which looks better than on presentation last week according to her nursing staff 01/26/2021 upon evaluation today patient appears to be doing well with regard to his wound. He is making good progress. With that being said there is some need currently for sharp debridement in regard to the wound there is some necrotic debris it is almost completely cleaned off but not completely. He is in agreement with that plan today. Fortunately there does not appear to be any evidence of active infection which is great and I am very pleased in that regard. He still wishes he did not have to wear the compression wrap. He did get his compression socks from elastic therapy though they are in the 15 to 20 mmHg range they did not have any his length as he is a tall guy in the 20 to 30 mmHg range. 02/02/2021 upon evaluation today patient appears to be doing excellent in regard to his leg ulcer. He has been tolerating the dressing changes without complication. Fortunately there is no evidence of active infection at this time. No fevers, chills, nausea, vomiting, or diarrhea. 02/09/2021 upon evaluation today patient's wound actually showing signs of improvement. Fortunately there does not appear to be any evidence of active infection which is great news overall very pleased with where  things stand today. 02/16/2021 upon evaluation today patient's wound is showing some signs of improvement. He did have a fairly aggressive debridement  last week he notes that he had quite a bit of discomfort he was some swelling following. With that being said I think this was to be expected considering what we had to do from a debridement standpoint. The good news is there does not appear to be any evidence of infection currently nonetheless I do believe that the patient may have some signs of vasculitis here. Based on the overall appearance of the wound today and beginning to suspect this. For that reason I think he would benefit from starting him on triamcinolone ointment to the wound bed and some of the immediate periwound to try to help out in this regard. 02/23/2021 upon evaluation today patient appears to be doing well with regard to his wound. He is actually making some progress here. The wound is roughly about the same size but the overall appearance of the wound bed is much improved. I do not see any signs of active infection at this time which is great news. No fevers, chills, nausea, vomiting, or diarrhea. 03/02/2021 upon evaluation today patient appears to be doing well with regard to his wound. I think the biggest issue I see right now is that with Hydrofera Blue this wound is staying somewhat dry. I think he could benefit from the use of collagen to try to help with getting this to stimulate some additional growth. He is in agreement with the plan. I think this also had a little bit more moisture which would be helpful at this point. 03/09/2021 on evaluation today patient appears to be doing about the same in regard to his wound is measuring a little bit smaller but still he does have the surface of the wound which is not quite as healthy as I would like to see. Fortunately there does not appear to be any active infection at this time. No fevers, chills, nausea, vomiting, or diarrhea. 03/16/2021 upon evaluation today patient appears to be doing better with regard to the overall appearance of his wound bed. There does not appear to be any  signs of significant slough buildup I think the Iodoflex has done very well for him. The patient's happy to hear this and see the improvement. With that being said he is continuing to have a little bit of expansion of the wound again I think this is more related to #1 edema control and #2 the fact that again we will get the wound bed clean. I feel like were pretty much today as far as cleaning up the wound bed is concerned now we just need to make sure the edema is optimized. I am just not certain that his compression socks are doing the job. 03/23/2021 upon evaluation today patient unfortunately appears to be doing a little bit worse with regard to his wound I was actually expecting this to be much better this week. With that being said I am getting concerned about the possibility of pyoderma gangrenosum. I did print off some information for the patient in this regard. Subsequently I am also can see about starting him on some steroid cream as well as an oral steroid. 03/30/2021 upon evaluation today patient appears to be doing significantly better in regard to his wound. He has started using the clobetasol which seems to have made a excellent improvement in regard to the wound today. 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 Either way at this point I would recommend that we avoid sharp debridements I think that is probably can be the best option based on what I am seeing currently. The patient voiced understanding. He also had questions about taking the prednisone with regard to his immune system. I explained that I do believe that that can be an issue long-term but for short-term which is mainly what we are doing here I do not think it is going to be a major issue for him and in fact I think would help more especially considering  the pyoderma here and the fact that is more of an autoimmune type issue than not using the prednisone. 04/06/2021 upon evaluation today patient's wound actually showed signs of excellent improvement. This definitely has gone a lot better since we started with the topical steroids and overall I am extremely pleased with where things stand. No fevers, chills, nausea, vomiting, or diarrhea. With that being said the patient continues to feel much better and states that the pain is actually dramatically improved as well. This definitely appears [place], [place] ([id number]) to be a pyoderma situation. 04/13/2021 upon evaluation today patient appears to be doing well with regard to his wound. Fortunately there does not appear to be any signs of active infection at this time. No fevers, chills, nausea, vomiting, or diarrhea. With that being said I do believe that he is overall doing extremely well. This is measuring smaller and does seem to be headed in the right direction. Electronic Signature(s) Signed: 04/13/2021 9:52:31 AM By: Worthy Keeler PA-C Entered By: Worthy Keeler on 04/13/2021 09:52:30 [place]let[place] Gash, [place] Carlean Jews ([id number]) -------------------------------------------------------------------------------- Physical Exam Details Patient Name: AXLE, CHILCOTT L. Date of Service: 04/13/2021 9:00 AM Medical Record Number: [id number] Patient Account Number: [id number] Date of Birth/Sex: [birth year] (50 y.o. M) Treating RN: Dolan Amen Primary Care Provider: PATIENT, NO Other Clinician: Referring Provider: Barkley Boards Treating Provider/Extender: Skipper Cliche in Treatment: 54 Constitutional Well-nourished and well-hydrated in no acute distress. Respiratory normal breathing without difficulty. Psychiatric this patient is able to make decisions and demonstrates good insight into disease process. Alert and Oriented x 3. pleasant and cooperative. Notes Upon inspection patient's wound bed showed signs  of good granulation epithelization at this point. Fortunately there does not appear to be any evidence of active infection which is great news and overall very pleased with where things stand today. No fevers, chills, nausea, vomiting, or diarrhea. Electronic Signature(s) Signed: 04/13/2021 9:52:46 AM By: Worthy Keeler PA-C Entered By: Worthy Keeler on 04/13/2021 09:52:46 [place], Amjad Carlean Jews ([id number]) -------------------------------------------------------------------------------- Physician Orders Details Patient Name: NAHIM, SWINEY L. Date of Service: 04/13/2021 9:00 AM Medical Record Number: [id number] Patient Account Number: [id number] Date of Birth/Sex: 09-May-1971 (50 y.o. M) Treating RN: Dolan Amen Primary Care Provider: PATIENT, NO Other Clinician: Referring Provider: Barkley Boards Treating Provider/Extender: Skipper Cliche in Treatment: 13 Verbal / Phone Orders: No Diagnosis Coding ICD-10 Coding Code Description I87.2 Venous insufficiency (chronic) (peripheral) L97.812 Non-pressure chronic ulcer of other part of right lower leg with fat layer exposed Follow-up Appointments o Return Appointment in 1 week. Bathing/ Shower/ Hygiene o May shower; gently cleanse wound with antibacterial soap, rinse and pat dry prior to dressing wounds Edema Control - Lymphedema / Segmental Compressive Device / Other Bilateral Lower Extremities o Patient  to wear own compression stockings. Remove compression stockings every night before going to bed and put on every morning when getting up. o Elevate, Exercise Daily and Avoid Standing for Long Periods of Time. o Elevate legs to the level of the heart and pump ankles as often as possible o Elevate leg(s) parallel to the floor when sitting. Wound Treatment Wound #1 - Lower Leg Wound Laterality: Right, Medial Cleanser: Soap and Water 1 x Per Day/30 Days Discharge Instructions: Gently cleanse wound with antibacterial soap, rinse and  pat dry prior to dressing wounds Topical: Clobetasol Propionate ointment 0.05%, 60 (g) tube 1 x Per Day/30 Days Discharge Instructions: Apply to wound bed and around wound Primary Dressing: Hydrofera Blue Ready Transfer Foam, 2.5x2.5 (in/in) 1 x Per Day/30 Days Discharge Instructions: Apply Hydrofera Blue Ready to wound bed as directed Secondary Dressing: Bordered Gauze Sterile-HBD 4x4 (in/in) 1 x Per Day/30 Days Discharge Instructions: Cover wound with Bordered Guaze Sterile as directed Electronic Signature(s) Signed: 04/13/2021 11:41:50 AM By: Dolan Amen RN Signed: 04/13/2021 5:46:21 PM By: Worthy Keeler PA-C Entered By: Dolan Amen on 04/13/2021 [place], [place] ([id number]) -------------------------------------------------------------------------------- Problem List Details Patient Name: JERET, BERL L. Date of Service: 04/13/2021 9:00 AM Medical Record Number: [id number] Patient Account Number: [id number] Date of Birth/Sex: [birth year] (50 y.o. 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 ([id number]) -------------------------------------------------------------------------------- Progress Note Details Patient Name: Nicolas Cousin L. Date of Service: 04/13/2021 9:00 AM Medical Record Number: [id number] Patient Account Number: [id number] Date of Birth/Sex: 1971-03-15 (50 y.o. M) Treating RN: Dolan Amen Primary Care Provider: PATIENT, NO Other Clinician: Referring Provider: Barkley Boards Treating Provider/Extender: Skipper Cliche in Treatment: 13 Subjective Chief Complaint Information obtained from Patient Right LE Ulcer History of Present Illness (HPI) 01/12/2021 upon evaluation today patient appears to be doing somewhat poorly in regard to a wound on his right medial lower leg. Currently he tells me that this has been going on since around the beginning of April. He has done a telehealth visit with a physician who initially gave him a triamcinolone cream along with Keflex for 5 days. Subsequently he ended up being seen at walk-in urgent care where they also given Keflex for 7 days that seem to be doing better for him. Overall he tells me that things have improved from the standpoint of redness. Fortunately there does not appear to be any signs of systemic infection to be honest right now even locally I do not see any evidence of infection right now. He does have 1 main wound with some scattering areas that look like they may have been small ulcerations but again for the most part these have filled in. This does look almost to be more of a vasculitis type scenario based on what I am seeing. Patient does have chronic venous insufficiency but is not wearing any compression even though it sounds like he has been told before he should. 5/27; patient with small wounds on his right medial ankle. He has chronic venous insufficiency and stasis dermatitis a  large open wound and several smaller areas all of which looks better than on presentation last week according to her nursing staff 01/26/2021 upon evaluation today patient appears to be doing well with regard to his wound. He is making good progress. With that being said there is some need currently for sharp debridement in regard to the wound there is some necrotic debris it is almost completely cleaned off but not completely. He is in  agreement with that plan today. Fortunately there does not appear to be any evidence of active infection which is great and I am very pleased in that regard. He still wishes he did not have to wear the compression wrap. He did get his compression socks from elastic therapy though they are in the 15 to 20 mmHg range they did not have any his length as he is a tall guy in the 20 to 30 mmHg range. 02/02/2021 upon evaluation today patient appears to be doing excellent in regard to his leg ulcer. He has been tolerating the dressing changes without complication. Fortunately there is no evidence of active infection at this time. No fevers, chills, nausea, vomiting, or diarrhea. 02/09/2021 upon evaluation today patient's wound actually showing signs of improvement. Fortunately there does not appear to be any evidence of active infection which is great news overall very pleased with where things stand today. 02/16/2021 upon evaluation today patient's wound is showing some signs of improvement. He did have a fairly aggressive debridement last week he notes that he had quite a bit of discomfort he was some swelling following. With that being said I think this was to be expected considering what we had to do from a debridement standpoint. The good news is there does not appear to be any evidence of infection currently nonetheless I do believe that the patient may have some signs of vasculitis here. Based on the overall appearance of the wound today and beginning to suspect this. For that reason I think he would benefit from starting him on triamcinolone ointment to the wound bed and some of the immediate periwound to try to help out in this regard. 02/23/2021 upon evaluation today patient appears to be doing well with regard to his wound. He is actually making some progress here. The wound is roughly about the same size but the overall appearance of the wound bed is much improved. I do not see any signs of active  infection at this time which is great news. No fevers, chills, nausea, vomiting, or diarrhea. 03/02/2021 upon evaluation today patient appears to be doing well with regard to his wound. I think the biggest issue I see right now is that with Hydrofera Blue this wound is staying somewhat dry. I think he could benefit from the use of collagen to try to help with getting this to stimulate some additional growth. He is in agreement with the plan. I think this also had a little bit more moisture which would be helpful at this point. 03/09/2021 on evaluation today patient appears to be doing about the same in regard to his wound is measuring a little bit smaller but still he does have the surface of the wound which is not quite as healthy as I would like to see. Fortunately there does not appear to be any active infection at this time. No fevers, chills, nausea, vomiting, or diarrhea. 03/16/2021 upon evaluation today patient appears to be doing better with regard to the overall appearance of his wound bed. There does not  appear to be any signs of significant slough buildup I think the Iodoflex has done very well for him. The patient's happy to hear this and see the improvement. With that being said he is continuing to have a little bit of expansion of the wound again I think this is more related to #1 edema control and #2 the fact that again we will get the wound bed clean. I feel like were pretty much today as far as cleaning up the wound bed is concerned now we just need to make sure the edema is optimized. I am just not certain that his compression socks are doing the job. 03/23/2021 upon evaluation today patient unfortunately appears to be doing a little bit worse with regard to his wound I was actually expecting this to be much better this week. With that being said I am getting concerned about the possibility of pyoderma gangrenosum. I did print off some information for the patient in this regard.  Subsequently I am also can see about starting him on some steroid cream as well as an oral steroid. 03/30/2021 upon evaluation today patient appears to be doing significantly better in regard to his wound. He has started using the clobetasol which seems to have made a excellent improvement in regard to the wound today. 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 Either way at this point I would recommend that we avoid sharp debridements I think that is probably can be the best option based on what I am seeing currently. The patient voiced understanding. He also had questions about taking the prednisone with regard to his immune system. I explained that I do believe that that can be an issue long-term but for short-term which is mainly what we are doing here I do not think it is going to be a major issue for him and in fact I think would help more especially considering the pyoderma here and the fact that is more of an [place], [place] ([id number]) autoimmune type issue than not using the prednisone. 04/06/2021 upon evaluation today patient's wound actually showed signs of excellent improvement. This definitely has gone a lot better since we started with the topical steroids and overall I am extremely pleased with where things stand. No fevers, chills, nausea, vomiting, or diarrhea. With that being said the patient continues to feel much better and states that the pain is actually dramatically improved as well. 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 04/13/2021 upon evaluation today patient appears to be doing well with regard to his wound. Fortunately there does not appear to be any signs of active infection at this time. No fevers, chills, nausea, vomiting, or diarrhea. With that being said I do believe that he is overall  doing extremely well. This is measuring smaller and does seem to be headed in the right direction. Objective Constitutional Well-nourished and well-hydrated in no acute distress. Vitals Time Taken: 9:14 AM, Height: 78 in, Weight: 249 lbs, BMI: 28.8, Temperature: 98.5 F, Pulse: 73 bpm, Respiratory Rate: 18 breaths/min, Blood Pressure: 129/83 mmHg. Respiratory normal breathing without difficulty. Psychiatric this patient is able to make decisions and demonstrates good insight into disease process. Alert and Oriented x 3. pleasant and cooperative. General Notes: Upon inspection patient's wound bed showed signs of good granulation epithelization at this  point. Fortunately there does not appear to be any evidence of active infection which is great news and overall very pleased with where things stand today. No fevers, chills, nausea, vomiting, or diarrhea. Integumentary (Hair, Skin) Wound #1 status is Open. Original cause of wound was Gradually Appeared. The date acquired was: 11/24/2020. The wound has been in treatment 13 weeks. The wound is located on the Right,Medial Lower Leg. The wound measures 0.8cm length x 1cm width x 0.2cm depth; 0.628cm^2 area and 0.126cm^3 volume. There is Fat Layer (Subcutaneous Tissue) exposed. There is no tunneling or undermining noted. There is a medium amount of serosanguineous drainage noted. The wound margin is flat and intact. There is medium (34-66%) red, pink granulation within the wound bed. There is a medium (34-66%) amount of necrotic tissue within the wound bed including Adherent Slough. Assessment Active Problems ICD-10 Venous insufficiency (chronic) (peripheral) Non-pressure chronic ulcer of other part of right lower leg with fat layer exposed Plan Follow-up Appointments: Return Appointment in 1 week. Bathing/ Shower/ Hygiene: May shower; gently cleanse wound with antibacterial soap, rinse and pat dry prior to dressing wounds Edema Control -  Lymphedema / Segmental Compressive Device / Other: Patient to wear own compression stockings. Remove compression stockings every night before going to bed and put on every morning when getting up. Elevate, Exercise Daily and Avoid Standing for Long Periods of Time. Elevate legs to the level of the heart and pump ankles as often as possible Elevate leg(s) parallel to the floor when sitting. WOUND #1: - Lower Leg Wound Laterality: Right, Medial [place], [place] Demar[place] L. ([id number]) Cleanser: Soap and Water 1 x Per Day/30 Days Discharge Instructions: Gently cleanse wound with antibacterial soap, rinse and pat dry prior to dressing wounds Topical: Clobetasol Propionate ointment 0.05%, 60 (g) tube 1 x Per Day/30 Days Discharge Instructions: Apply to wound bed and around wound Primary Dressing: Hydrofera Blue Ready Transfer Foam, 2.5x2.5 (in/in) 1 x Per Day/30 Days Discharge Instructions: Apply Hydrofera Blue Ready to wound bed as directed Secondary Dressing: Bordered Gauze Sterile-HBD 4x4 (in/in) 1 x Per Day/30 Days Discharge Instructions: Cover wound with Bordered Guaze Sterile as directed 1. Would recommend currently that we going continue with wound care measures as before and the patient is in agreement with that plan this includes the use of the clobetasol topical ointment. 2. I am also can recommend that we continue with the [facility] since this is doing well. 3. I would also suggest that we continue with the border gauze dressing which I think is also doing a great job. We will see patient back for reevaluation in 1 week here in the clinic. If anything worsens or changes patient will contact our office for additional recommendations. Electronic Signature(s) Signed: 04/13/2021 9:53:17 AM By: Worthy Keeler PA-C Entered By: Worthy Keeler on 04/13/2021 09:53:16 Buehrle, Husain L. ([id number]) -------------------------------------------------------------------------------- SuperBill  Details Patient Name: Nicolas Cousin L. Date of Service: 04/13/2021 Medical Record Number: [id number] Patient Account Number: [id number] Date of Birth/Sex: 11/16/1970 (50 y.o. M) Treating RN: Dolan Amen Primary Care Provider: PATIENT, NO Other Clinician: Referring Provider: Barkley Boards Treating Provider/Extender: Skipper Cliche in Treatment: 13 Diagnosis Coding ICD-10 Codes Code Description I87.2 Venous insufficiency (chronic) (peripheral) L97.812 Non-pressure chronic ulcer of other part of right lower leg with fat layer exposed Facility Procedures CPT4 Code: [id number] Description: 99213 - WOUND CARE VISIT-LEV 3 EST PT Modifier: Quantity: 1 Physician Procedures CPT4 Code: [id number] Description: [id number] - WC PHYS LEVEL 4 - EST PT Modifier: Quantity:  1 CPT4 Code: Description: ICD-10 Diagnosis Description [id number] Non-pressure chronic ulcer of other part of right lower leg with fat la I87.2 Venous insufficiency (chronic) (peripheral) Modifier: yer exposed Quantity: Electronic Signature(s) Signed: 04/13/2021 9:53:33 AM By: Worthy Keeler PA-C Entered By: Worthy Keeler on 04/13/2021 09:53:33

## 2021-04-20 ENCOUNTER — Other Ambulatory Visit: Payer: Self-pay

## 2021-04-20 ENCOUNTER — Encounter: Payer: Self-pay | Admitting: Physician Assistant

## 2021-04-20 NOTE — Progress Notes (Addendum)
[name], [name] ([id number]) Visit Report for 04/20/2021 Chief Complaint Document Details Patient Name: [name], [name]. Date of Service: 04/20/2021 9:00 AM Medical Record Number: [id number] Patient Account Number: [id number] Date of Birth/Sex: 16-Jul-1971 (50 y.o. M) Treating RN: Cornell Barman Primary Care Provider: PATIENT, NO Other Clinician: Referring Provider: Barkley Boards Treating Provider/Extender: Skipper Cliche in Treatment: 14 Information Obtained from: Patient Chief Complaint Right LE Ulcer Electronic Signature(s) Signed: 04/20/2021 9:19:26 AM By: Worthy Keeler PA-C Entered By: Worthy Keeler on 04/20/2021 09:19:26 [place], [place] ([id number]) -------------------------------------------------------------------------------- HPI Details Patient Name: Nicolas Cousin L. Date of Service: 04/20/2021 9:00 AM Medical Record Number: [id number] Patient Account Number: [id number] Date of Birth/Sex: 09-Feb-1971 (50 y.o. M) Treating RN: Cornell Barman Primary Care Provider: PATIENT, NO Other Clinician: Referring Provider: Barkley Boards Treating Provider/Extender: Skipper Cliche in Treatment: 14 History of Present Illness HPI Description: 01/12/2021 upon evaluation today patient appears to be doing somewhat poorly in regard to a wound on his right medial lower leg. Currently he tells me that this has been going on since around the beginning of April. He has done a telehealth visit with a physician who initially gave him a triamcinolone cream along with Keflex for 5 days. Subsequently he ended up being seen at walk-in urgent care where they also given Keflex for 7 days that seem to be doing better for him. Overall he tells me that things have improved from the standpoint of redness. Fortunately there does not appear to be any signs of systemic infection to be honest right now even locally I do not see any evidence of infection right now. He does have 1 main wound with some scattering areas that  look like they may have been small ulcerations but again for the most part these have filled in. This does look almost to be more of a vasculitis type scenario based on what I am seeing. Patient does have chronic venous insufficiency but is not wearing any compression even though it sounds like he has been told before he should. 5/27; patient with small wounds on his right medial ankle. He has chronic venous insufficiency and stasis dermatitis a large open wound and several smaller areas all of which looks better than on presentation last week according to her nursing staff 01/26/2021 upon evaluation today patient appears to be doing well with regard to his wound. He is making good progress. With that being said there is some need currently for sharp debridement in regard to the wound there is some necrotic debris it is almost completely cleaned off but not completely. He is in agreement with that plan today. Fortunately there does not appear to be any evidence of active infection which is great and I am very pleased in that regard. He still wishes he did not have to wear the compression wrap. He did get his compression socks from [facility] though they are in the 15 to 20 mmHg range they did not have any his length as he is a tall guy in the 20 to 30 mmHg range. 02/02/2021 upon evaluation today patient appears to be doing excellent in regard to his leg ulcer. He has been tolerating the dressing changes without complication. Fortunately there is no evidence of active infection at this time. No fevers, chills, nausea, vomiting, or diarrhea. 02/09/2021 upon evaluation today patient's wound actually showing signs of improvement. Fortunately there does not appear to be any evidence of active infection which is great news overall very pleased with where  things stand today. 02/16/2021 upon evaluation today patient's wound is showing some signs of improvement. He did have a fairly aggressive debridement last  week he notes that he had quite a bit of discomfort he was some swelling following. With that being said I think this was to be expected considering what we had to do from a debridement standpoint. The good news is there does not appear to be any evidence of infection currently nonetheless I do believe that the patient may have some signs of vasculitis here. Based on the overall appearance of the wound today and beginning to suspect this. For that reason I think he would benefit from starting him on triamcinolone ointment to the wound bed and some of the immediate periwound to try to help out in this regard. 02/23/2021 upon evaluation today patient appears to be doing well with regard to his wound. He is actually making some progress here. The wound is roughly about the same size but the overall appearance of the wound bed is much improved. I do not see any signs of active infection at this time which is great news. No fevers, chills, nausea, vomiting, or diarrhea. 03/02/2021 upon evaluation today patient appears to be doing well with regard to his wound. I think the biggest issue I see right now is that with Hydrofera Blue this wound is staying somewhat dry. I think he could benefit from the use of collagen to try to help with getting this to stimulate some additional growth. He is in agreement with the plan. I think this also had a little bit more moisture which would be helpful at this point. 03/09/2021 on evaluation today patient appears to be doing about the same in regard to his wound is measuring a little bit smaller but still he does have the surface of the wound which is not quite as healthy as I would like to see. Fortunately there does not appear to be any active infection at this time. No fevers, chills, nausea, vomiting, or diarrhea. 03/16/2021 upon evaluation today patient appears to be doing better with regard to the overall appearance of his wound bed. There does not appear to be any signs  of significant slough buildup I think the Iodoflex has done very well for him. The patient's happy to hear this and see the improvement. With that being said he is continuing to have a little bit of expansion of the wound again I think this is more related to #1 edema control and #2 the fact that again we will get the wound bed clean. I feel like were pretty much today as far as cleaning up the wound bed is concerned now we just need to make sure the edema is optimized. I am just not certain that his compression socks are doing the job. 03/23/2021 upon evaluation today patient unfortunately appears to be doing a little bit worse with regard to his wound I was actually expecting this to be much better this week. With that being said I am getting concerned about the possibility of pyoderma gangrenosum. I did print off some information for the patient in this regard. Subsequently I am also can see about starting him on some steroid cream as well as an oral steroid. 03/30/2021 upon evaluation today patient appears to be doing significantly better in regard to his wound. He has started using the clobetasol which seems to have made a excellent improvement in regard to the wound today. 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 Either way at this point I would recommend that we avoid sharp debridements I think that is probably can be the best option based on what I am seeing currently. The patient voiced understanding. He also had questions about taking the prednisone with regard to his immune system. I explained that I do believe that that can be an issue long-term but for short-term which is mainly what we are doing here I do not think it is going to be a major issue for him and in fact I think would help more especially considering the  pyoderma here and the fact that is more of an autoimmune type issue than not using the prednisone. 04/06/2021 upon evaluation today patient's wound actually showed signs of excellent improvement. This definitely has gone a lot better since we started with the topical steroids and overall I am extremely pleased with where things stand. No fevers, chills, nausea, vomiting, or diarrhea. With that being said the patient continues to feel much better and states that the pain is actually dramatically improved as well. This definitely appears Nicolino, [facility] ([id number]) to be a pyoderma situation. 04/13/2021 upon evaluation today patient appears to be doing well with regard to his wound. Fortunately there does not appear to be any signs of active infection at this time. No fevers, chills, nausea, vomiting, or diarrhea. With that being said I do believe that he is overall doing extremely well. This is measuring smaller and does seem to be headed in the right direction. 04/20/2021 upon evaluation today patient appears to be doing well with regard to his wound. He still has quite a bit of slough covering the surface of the wound. With that being said we previously debrided the wound he actually had a bit of worsening and therefore I am somewhat reluctant to proceed with any further debridement at this point. In fact this got quite a bit larger postdebridement. 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 Nonetheless I think it is can to take time to get this to heal. Electronic Signature(s) Signed: 04/20/2021 9:29:35 AM By: Worthy Keeler PA-C Entered By: Worthy Keeler on 04/20/2021 09:29:35 [place], Billyjack Carlean Jews ([id number]) -------------------------------------------------------------------------------- Physical Exam Details Patient Name: CHEYENE, STUDE L. Date of Service: 04/20/2021 9:00 AM Medical Record Number: [id number] Patient Account Number: [id number] Date of Birth/Sex: 09-13-1970 (50 y.o.  M) Treating RN: Cornell Barman Primary Care Provider: PATIENT, NO Other Clinician: Referring Provider: Barkley Boards Treating Provider/Extender: Skipper Cliche in Treatment: 71 Constitutional Well-nourished and well-hydrated in no acute distress. Respiratory normal breathing without difficulty. Psychiatric this patient is able to make decisions and demonstrates good insight into disease process. Alert and Oriented x 3. pleasant and cooperative. Notes Upon inspection patient's wound bed actually showed signs of good granulation epithelization around some of the edges although there is slough in the base of the wound centrally. This really does need to be cleaned away but right now I do not really see much of a way to do this from the standpoint of medications as we will have the use of topical steroid really Santyl is not an option when it comes to sharp debridement this actually worsen the wound in the past I think which represents more of a pyoderma type situation. Electronic Signature(s) Signed: 04/20/2021 9:30:16 AM By: Melburn Hake,  Tamanna Whitson PA-C Entered By: Worthy Keeler on 04/20/2021 09:30:16 [name] ([id number]) -------------------------------------------------------------------------------- Physician Orders Details Patient Name: JAEWON, WINZER L. Date of Service: 04/20/2021 9:00 AM Medical Record Number: [id number] Patient Account Number: [id number] Date of Birth/Sex: 1971-06-19 (50 y.o. M) Treating RN: Cornell Barman Primary Care Provider: PATIENT, NO Other Clinician: Referring Provider: Barkley Boards Treating Provider/Extender: Skipper Cliche in Treatment: 14 Verbal / Phone Orders: No Diagnosis Coding ICD-10 Coding Code Description I87.2 Venous insufficiency (chronic) (peripheral) L97.812 Non-pressure chronic ulcer of other part of right lower leg with fat layer exposed Follow-up Appointments o Return Appointment in 1 week. Bathing/ Shower/ Hygiene o May shower; gently  cleanse wound with antibacterial soap, rinse and pat dry prior to dressing wounds Edema Control - Lymphedema / Segmental Compressive Device / Other Bilateral Lower Extremities o Patient to wear own compression stockings. Remove compression stockings every night before going to bed and put on every morning when getting up. o Elevate, Exercise Daily and Avoid Standing for Long Periods of Time. o Elevate legs to the level of the heart and pump ankles as often as possible o Elevate leg(s) parallel to the floor when sitting. Wound Treatment Wound #1 - Lower Leg Wound Laterality: Right, Medial Cleanser: Soap and Water 1 x Per Day/30 Days Discharge Instructions: Gently cleanse wound with antibacterial soap, rinse and pat dry prior to dressing wounds Topical: Clobetasol Propionate ointment 0.05%, 60 (g) tube 1 x Per Day/30 Days Discharge Instructions: Apply to wound bed and around wound Primary Dressing: Hydrofera Blue Ready Transfer Foam, 2.5x2.5 (in/in) 1 x Per Day/30 Days Discharge Instructions: Apply Hydrofera Blue Ready to wound bed as directed Secondary Dressing: Bordered Gauze Sterile-HBD 4x4 (in/in) 1 x Per Day/30 Days Discharge Instructions: Cover wound with Bordered Guaze Sterile as directed Electronic Signature(s) Signed: 04/20/2021 3:53:32 PM By: Worthy Keeler PA-C Signed: 04/20/2021 4:00:05 PM By: Gretta Cool, BSN, RN, CWS, Kim RN, BSN Entered By: Gretta Cool, BSN, RN, CWS, Kim on 04/20/2021 [place], [place]. ([id number]) -------------------------------------------------------------------------------- Problem List Details Patient Name: SANTA, GALLIPEAU L. Date of Service: 04/20/2021 9:00 AM Medical Record Number: [id number] Patient Account Number: [id number] Date of Birth/Sex: May 29, 1971 (50 y.o. M) Treating RN: Cornell Barman Primary Care Provider: PATIENT, NO Other Clinician: Referring Provider: Barkley Boards Treating Provider/Extender: Skipper Cliche in Treatment: 14 Active  Problems ICD-10 Encounter Code Description Active Date MDM Diagnosis I87.2 Venous insufficiency (chronic) (peripheral) 01/12/2021 No Yes L97.812 Non-pressure chronic ulcer of other part of right lower leg with fat layer 01/12/2021 No Yes exposed Inactive Problems Resolved Problems Electronic Signature(s) Signed: 04/20/2021 9:19:15 AM By: Worthy Keeler PA-C Entered By: Worthy Keeler on 04/20/2021 09:19:15 [place]no[place], Amiri L. ([id number]) -------------------------------------------------------------------------------- Progress Note Details Patient Name: Nicolas Cousin L. Date of Service: 04/20/2021 9:00 AM Medical Record Number: [id number] Patient Account Number: [id number] Date of Birth/Sex: [birth year] (50 y.o. M) Treating RN: Cornell Barman Primary Care Provider: PATIENT, NO Other Clinician: Referring Provider: Barkley Boards Treating Provider/Extender: Skipper Cliche in Treatment: 14 Subjective Chief Complaint Information obtained from Patient Right LE Ulcer History of Present Illness (HPI) 01/12/2021 upon evaluation today patient appears to be doing somewhat poorly in regard to a wound on his right medial lower leg. Currently he tells me that this has been going on since around the beginning of April. He has done a telehealth visit with a physician who initially gave him a triamcinolone cream along with Keflex for 5 days. Subsequently he ended up being seen at walk-in urgent care where they  also given Keflex for 7 days that seem to be doing better for him. Overall he tells me that things have improved from the standpoint of redness. Fortunately there does not appear to be any signs of systemic infection to be honest right now even locally I do not see any evidence of infection right now. He does have 1 main wound with some scattering areas that look like they may have been small ulcerations but again for the most part these have filled in. This does look almost to be more of a vasculitis  type scenario based on what I am seeing. Patient does have chronic venous insufficiency but is not wearing any compression even though it sounds like he has been told before he should. 5/27; patient with small wounds on his right medial ankle. He has chronic venous insufficiency and stasis dermatitis a large open wound and several smaller areas all of which looks better than on presentation last week according to her nursing staff 01/26/2021 upon evaluation today patient appears to be doing well with regard to his wound. He is making good progress. With that being said there is some need currently for sharp debridement in regard to the wound there is some necrotic debris it is almost completely cleaned off but not completely. He is in agreement with that plan today. Fortunately there does not appear to be any evidence of active infection which is great and I am very pleased in that regard. He still wishes he did not have to wear the compression wrap. He did get his compression socks from elastic therapy though they are in the 15 to 20 mmHg range they did not have any his length as he is a tall guy in the 20 to 30 mmHg range. 02/02/2021 upon evaluation today patient appears to be doing excellent in regard to his leg ulcer. He has been tolerating the dressing changes without complication. Fortunately there is no evidence of active infection at this time. No fevers, chills, nausea, vomiting, or diarrhea. 02/09/2021 upon evaluation today patient's wound actually showing signs of improvement. Fortunately there does not appear to be any evidence of active infection which is great news overall very pleased with where things stand today. 02/16/2021 upon evaluation today patient's wound is showing some signs of improvement. He did have a fairly aggressive debridement last week he notes that he had quite a bit of discomfort he was some swelling following. With that being said I think this was to be expected  considering what we had to do from a debridement standpoint. The good news is there does not appear to be any evidence of infection currently nonetheless I do believe that the patient may have some signs of vasculitis here. Based on the overall appearance of the wound today and beginning to suspect this. For that reason I think he would benefit from starting him on triamcinolone ointment to the wound bed and some of the immediate periwound to try to help out in this regard. 02/23/2021 upon evaluation today patient appears to be doing well with regard to his wound. He is actually making some progress here. The wound is roughly about the same size but the overall appearance of the wound bed is much improved. I do not see any signs of active infection at this time which is great news. No fevers, chills, nausea, vomiting, or diarrhea. 03/02/2021 upon evaluation today patient appears to be doing well with regard to his wound. I think the biggest issue I see right  now is that with [facility] this wound is staying somewhat dry. I think he could benefit from the use of collagen to try to help with getting this to stimulate some additional growth. He is in agreement with the plan. I think this also had a little bit more moisture which would be helpful at this point. 03/09/2021 on evaluation today patient appears to be doing about the same in regard to his wound is measuring a little bit smaller but still he does have the surface of the wound which is not quite as healthy as I would like to see. Fortunately there does not appear to be any active infection at this time. No fevers, chills, nausea, vomiting, or diarrhea. 03/16/2021 upon evaluation today patient appears to be doing better with regard to the overall appearance of his wound bed. There does not appear to be any signs of significant slough buildup I think the Iodoflex has done very well for him. The patient's happy to hear this and see the improvement.  With that being said he is continuing to have a little bit of expansion of the wound again I think this is more related to #1 edema control and #2 the fact that again we will get the wound bed clean. I feel like were pretty much today as far as cleaning up the wound bed is concerned now we just need to make sure the edema is optimized. I am just not certain that his compression socks are doing the job. 03/23/2021 upon evaluation today patient unfortunately appears to be doing a little bit worse with regard to his wound I was actually expecting this to be much better this week. With that being said I am getting concerned about the possibility of pyoderma gangrenosum. I did print off some information for the patient in this regard. Subsequently I am also can see about starting him on some steroid cream as well as an oral steroid. 03/30/2021 upon evaluation today patient appears to be doing significantly better in regard to his wound. He has started using the clobetasol which seems to have made a excellent improvement in regard to the wound today. 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 Either way at this point I would recommend that we avoid sharp debridements I think that is probably can be the best option based on what I am seeing currently. The patient voiced understanding. He also had questions about taking the prednisone with regard to his immune system. I explained that I do believe that that can be an issue long-term but for short-term which is mainly what we are doing here I do not think it is going to be a major issue for him and in fact I think would help more especially considering the pyoderma here and the fact that is more of an Burke, Bagnell ([id number]) autoimmune type issue than not using the prednisone. 04/06/2021  upon evaluation today patient's wound actually showed signs of excellent improvement. This definitely has gone a lot better since we started with the topical steroids and overall I am extremely pleased with where things stand. No fevers, chills, nausea, vomiting, or diarrhea. With that being said the patient continues to feel much better and states that the pain is actually dramatically improved as well. This definitely appears to be a  pyoderma situation. 04/13/2021 upon evaluation today patient appears to be doing well with regard to his wound. Fortunately there does not appear to be any signs of active infection at this time. No fevers, chills, nausea, vomiting, or diarrhea. With that being said I do believe that he is overall doing extremely well. This is measuring smaller and does seem to be headed in the right direction. 04/20/2021 upon evaluation today patient appears to be doing well with regard to his wound. He still has quite a bit of slough covering the surface of the wound. With that being said we previously debrided the wound he actually had a bit of worsening and therefore I am somewhat reluctant to proceed with any further debridement at this point. In fact this got quite a bit larger postdebridement. 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 Nonetheless I think it is can to take time to get this to heal. Objective Constitutional Well-nourished and well-hydrated in no acute distress. Vitals Time Taken: 9:08 AM, Height: 78 in, Weight: 249 lbs, BMI: 28.8, Temperature: 98.4 F, Pulse: 60 bpm, Respiratory Rate: 18 breaths/min, Blood Pressure: 143/76 mmHg. Respiratory normal breathing without difficulty. Psychiatric this patient is able to make decisions and demonstrates good insight into disease process. Alert and Oriented x 3. pleasant and cooperative. General Notes: Upon inspection patient's wound bed actually showed signs of good granulation epithelization around some of  the edges although there is slough in the base of the wound centrally. This really does need to be cleaned away but right now I do not really see much of a way to do this from the standpoint of medications as we will have the use of topical steroid really Santyl is not an option when it comes to sharp debridement this actually worsen the wound in the past I think which represents more of a pyoderma type situation. Integumentary (Hair, Skin) Wound #1 status is Open. Original cause of wound was Gradually Appeared. The date acquired was: 11/24/2020. The wound has been in treatment 14 weeks. The wound is located on the Right,Medial Lower Leg. The wound measures 0.8cm length x 1.1cm width x 0.1cm depth; 0.691cm^2 area and 0.069cm^3 volume. There is Fat Layer (Subcutaneous Tissue) exposed. There is no tunneling or undermining noted. There is a medium amount of serosanguineous drainage noted. The wound margin is flat and intact. There is no granulation within the wound bed. There is a large (67-100%) amount of necrotic tissue within the wound bed including Adherent Slough. Assessment Active Problems ICD-10 Venous insufficiency (chronic) (peripheral) Non-pressure chronic ulcer of other part of right lower leg with fat layer exposed Plan Follow-up Appointments: Return Appointment in 1 week. Bathing/ Shower/ Hygiene: May shower; gently cleanse wound with antibacterial soap, rinse and pat dry prior to dressing wounds Edema Control - Lymphedema / Segmental Compressive Device / OtherYsabel Emminger, Ethyn L. ([id number]) Patient to wear own compression stockings. Remove compression stockings every night before going to bed and put on every morning when getting up. Elevate, Exercise Daily and Avoid Standing for Long Periods of Time. Elevate legs to the level of the heart and pump ankles as often as possible Elevate leg(s) parallel to the floor when sitting. WOUND #1: - Lower Leg Wound Laterality: Right,  Medial Cleanser: Soap and Water 1 x Per Day/30 Days Discharge Instructions: Gently cleanse wound with antibacterial soap, rinse and pat dry prior to dressing wounds Topical: Clobetasol Propionate ointment 0.05%, 60 (g) tube 1 x Per Day/30 Days Discharge Instructions: Apply to  wound bed and around wound Primary Dressing: Hydrofera Blue Ready Transfer Foam, 2.5x2.5 (in/in) 1 x Per Day/30 Days Discharge Instructions: Apply Hydrofera Blue Ready to wound bed as directed Secondary Dressing: Bordered Gauze Sterile-HBD 4x4 (in/in) 1 x Per Day/30 Days Discharge Instructions: Cover wound with Bordered Guaze Sterile as directed 1. Would recommend currently that we going continue with the wound care measures as before and the patient is in agreement with plan. We are using the clobetasol topically which is helping with inflammation side effects. 2. I am also can recommend we continue with the [facility] for the time being and the patient seems to be doing well with that. 3. I am also can recommend that we cover this with a border gauze dressing. He is using his compression stockings at the top. We will see patient back for reevaluation in 1 week here in the clinic. If anything worsens or changes patient will contact our office for additional recommendations. Electronic Signature(s) Signed: 04/20/2021 9:30:47 AM By: Worthy Keeler PA-C Entered By: Worthy Keeler on 04/20/2021 09:30:47 Miyasato, Charlis Carlean Jews ([id number]) -------------------------------------------------------------------------------- SuperBill Details Patient Name: Nicolas Cousin L. Date of Service: 04/20/2021 Medical Record Number: [id number] Patient Account Number: [id number] Date of Birth/Sex: [birth year] (50 y.o. M) Treating RN: Cornell Barman Primary Care Provider: PATIENT, NO Other Clinician: Referring Provider: Barkley Boards Treating Provider/Extender: Skipper Cliche in Treatment: 14 Diagnosis Coding ICD-10 Codes Code  Description I87.2 Venous insufficiency (chronic) (peripheral) L97.812 Non-pressure chronic ulcer of other part of right lower leg with fat layer exposed Facility Procedures CPT4 Code: [id number] Description: [place] VISIT-LEV 3 EST PT Modifier: Quantity: 1 Physician Procedures CPT4 Code: [id number] Description: [id number] - WC PHYS LEVEL 3 - EST PT Modifier: Quantity: 1 CPT4 Code: Description: ICD-10 Diagnosis Description I87.2 Venous insufficiency (chronic) (peripheral) L97.812 Non-pressure chronic ulcer of other part of right lower leg with fat la Modifier: yer exposed Quantity: Electronic Signature(s) Signed: 04/20/2021 9:31:06 AM By: Worthy Keeler PA-C Entered By: Worthy Keeler on 04/20/2021 09:31:06

## 2021-04-20 NOTE — Progress Notes (Signed)
ADALBERT, ALBERTO ([id number]) Visit Report for 04/20/2021 Arrival Information Details Patient Name: [name], [name]. Date of Service: 04/20/2021 9:00 AM Medical Record Number: [id number] Patient Account Number: [id number] Date of Birth/Sex: 1970/10/07 (50 y.o. M) Treating RN: Cornell Barman Primary Care Jailyn Leeson: PATIENT, NO Other Clinician: Referring Courtney Bellizzi: Barkley Boards Treating Zyair Rhein/Extender: Skipper Cliche in Treatment: 14 Visit Information History Since Last Visit Added or deleted any medications: No Patient Arrived: Ambulatory Pain Present Now: No Arrival Time: 09:07 Accompanied By: self Transfer Assistance: None Patient Identification Verified: Yes Secondary Verification Process Completed: Yes Patient Requires Transmission-Based Precautions: No Patient Has Alerts: Yes Patient Alerts: NOT DIABETIC Electronic Signature(s) Signed: 04/20/2021 4:00:05 PM By: Gretta Cool, BSN, RN, CWS, Kim RN, BSN Entered By: Gretta Cool, BSN, RN, CWS, Kim on 04/20/2021 09:08:07 [name] ([id number]) -------------------------------------------------------------------------------- Clinic Level of Care Assessment Details Patient Name: [name], [name]. Date of Service: 04/20/2021 9:00 AM Medical Record Number: [id number] Patient Account Number: [id number] Date of Birth/Sex: 09/07/1970 (50 y.o. M) Treating RN: Cornell Barman Primary Care Alene Bergerson: PATIENT, NO Other Clinician: Referring Zahriyah Joo: Barkley Boards Treating Daisha Filosa/Extender: Skipper Cliche in Treatment: 14 Clinic Level of Care Assessment Items TOOL 4 Quantity Score _0  - Use when only an EandM is performed on FOLLOW-UP visit 0 ASSESSMENTS - Nursing Assessment / Reassessment X - Reassessment of Co-morbidities (includes updates in patient status) 1 10 X- 1 5 Reassessment of Adherence to Treatment Plan ASSESSMENTS - Wound and Skin Assessment / Reassessment X - Simple Wound Assessment / Reassessment - one wound 1 5 _1  - 0 Complex Wound  Assessment / Reassessment - multiple wounds _2  - 0 Dermatologic / Skin Assessment (not related to wound area) ASSESSMENTS - Focused Assessment _3  - Circumferential Edema Measurements - multi extremities 0 _4  - 0 Nutritional Assessment / Counseling / Intervention _5  - 0 Lower Extremity Assessment (monofilament, tuning fork, pulses) _6  - 0 Peripheral Arterial Disease Assessment (using hand held doppler) ASSESSMENTS - Ostomy and/or Continence Assessment and Care _7  - Incontinence Assessment and Management 0 _8  - 0 Ostomy Care Assessment and Management (repouching, etc.) PROCESS - Coordination of Care X - Simple Patient / Family Education for ongoing care 1 15 _9  - 0 Complex (extensive) Patient / Family Education for ongoing care X- 1 10 Staff obtains Programmer, systems, Records, Test Results / Process Orders _10  - 0 Staff telephones HHA, Nursing Homes / Clarify orders / etc _11  - 0 Routine Transfer to another Facility (non-emergent condition) _12  - 0 Routine Hospital Admission (non-emergent condition) _13  - 0 New Admissions / Biomedical engineer / Ordering NPWT, Apligraf, etc. _14  - 0 Emergency Hospital Admission (emergent condition) X- 1 10 Simple Discharge Coordination _15  - 0 Complex (extensive) Discharge Coordination PROCESS - Special Needs _16  - Pediatric / Minor Patient Management 0 _17  - 0 Isolation Patient Management _18  - 0 Hearing / Language / Visual special needs _19  - 0 Assessment of Community assistance (transportation, D/C planning, etc.) _20  - 0 Additional assistance / Altered mentation _21  - 0 Support Surface(s) Assessment (bed, cushion, seat, etc.) INTERVENTIONS - Wound Cleansing / Measurement [name], [name]. ([id number]) X- 1 5 Simple Wound Cleansing - one wound _22  - 0 Complex Wound Cleansing - multiple wounds X- 1 5 Wound Imaging (photographs - any number of wounds) _23  - 0 Wound Tracing (instead of photographs) X- 1 5 Simple Wound Measurement - one wound _24  -  0 Complex Wound Measurement - multiple wounds INTERVENTIONS - Wound Dressings _25  - Small Wound Dressing one or multiple wounds 0 X-  1 15 Medium Wound Dressing one or multiple wounds _0  - 0 Large Wound Dressing one or multiple wounds <RUEAVWUJWJXBJYNW>_2<\/NFAOZHYQMVHQIONG>_2  - 0 Application of Medications - topical <XBMWUXLKGMWNUUVO>_5<\/DGUYQIHKVQQVZDGL>_8  - 0 Application of Medications - injection INTERVENTIONS - Miscellaneous _3  - External ear exam 0 _4  - 0 Specimen Collection (cultures, biopsies, blood, body fluids, etc.) _5  - 0 Specimen(s) / Culture(s) sent or taken to Lab for analysis _6  - 0 Patient Transfer (multiple staff / Civil Service fast streamer / Similar devices) _7  - 0 Simple Staple / Suture removal (25 or less) _8  - 0 Complex Staple / Suture removal (26 or more) _9  - 0 Hypo / Hyperglycemic Management (close monitor of Blood Glucose) _10  - 0 Ankle / Brachial Index (ABI) - do not check if billed separately X- 1 5 Vital Signs Has the patient been seen at the hospital within the last three years: Yes Total Score: 90 Level Of Care: New/Established - Level 3 Electronic Signature(s) Signed: 04/20/2021 4:00:05 PM By: Gretta Cool, BSN, RN, CWS, Kim RN, BSN Entered By: Gretta Cool, BSN, RN, CWS, Kim on 04/20/2021 75:64:33 [name] ([id number]) -------------------------------------------------------------------------------- Encounter Discharge Information Details Patient Name: [name], [name]. Date of Service: 04/20/2021 9:00 AM Medical Record Number: [id number] Patient Account Number: [id number] Date of Birth/Sex: 1970/11/25 (50 y.o. M) Treating RN: Cornell Barman Primary Care Takao Lizer: PATIENT, NO Other Clinician: Referring Ruari Duggan: Barkley Boards Treating Modean Mccullum/Extender: Skipper Cliche in Treatment: 14 Encounter Discharge Information Items Discharge Condition: Stable Ambulatory Status: Ambulatory Discharge Destination: Home Transportation: Private Auto Accompanied By: self Schedule Follow-up Appointment: Yes Clinical Summary of Care: Electronic  Signature(s) Signed: 04/20/2021 4:00:05 PM By: Gretta Cool, BSN, RN, CWS, Kim RN, BSN Entered By: Gretta Cool, BSN, RN, CWS, Kim on 04/20/2021 09:28:10 [name] ([id number]) -------------------------------------------------------------------------------- Lower Extremity Assessment Details Patient Name: [name], [name]. Date of Service: 04/20/2021 9:00 AM Medical Record Number: [id number] Patient Account Number: [id number] Date of Birth/Sex: November 09, 1970 (50 y.o. M) Treating RN: Cornell Barman Primary Care Niraj Kudrna: PATIENT, NO Other Clinician: Referring Arliss Frisina: Barkley Boards Treating Vahe Pienta/Extender: Skipper Cliche in Treatment: 14 Edema Assessment Assessed: [Left: No] [Right: No] Edema: [Left: Ye] [Right: s] Calf Left: Right: Point of Measurement: 40 cm From Medial Instep 36.5 cm Ankle Left: Right: Point of Measurement: 10 cm From Medial Instep 26.9 cm Vascular Assessment Pulses: Dorsalis Pedis Palpable: [Right:Yes] Electronic Signature(s) Signed: 04/20/2021 4:00:05 PM By: Gretta Cool, BSN, RN, CWS, Kim RN, BSN Entered By: Gretta Cool, BSN, RN, CWS, Kim on 04/20/2021 09:13:48 Goeser, Sahith Carlean Jews ([id number]) -------------------------------------------------------------------------------- Multi Wound Chart Details Patient Name: [name]. Date of Service: 04/20/2021 9:00 AM Medical Record Number: [id number] Patient Account Number: [id number] Date of Birth/Sex: Jun 29, 1971 (50 y.o. M) Treating RN: Cornell Barman Primary Care Raeqwon Lux: PATIENT, NO Other Clinician: Referring Kit Mollett: Barkley Boards Treating Calvyn Kurtzman/Extender: Skipper Cliche in Treatment: 14 Vital Signs Height(in): 78 Pulse(bpm): 60 Weight(lbs): 249 Blood Pressure(mmHg): 143/76 Body Mass Index(BMI): 29 Temperature(F): 98.4 Respiratory Rate(breaths/min): 18 Photos: [N/A:N/A] Wound Location: Right, Medial Lower Leg N/A N/A Wounding Event: Gradually Appeared N/A N/A Primary Etiology: Venous Leg Ulcer N/A N/A Date Acquired:  11/24/2020 N/A N/A Weeks of Treatment: 14 N/A N/A Wound Status: Open N/A N/A Measurements [name] x W x D (cm) 0.8x1.1x0.1 N/A N/A Area (cm) : 0.691 N/A N/A Volume (cm) : 0.069 N/A N/A % Reduction in Area: 18.50% N/A N/A % Reduction in Volume: 59.40% N/A N/A Classification: Full Thickness Without Exposed N/A N/A Support Structures Exudate Amount: Medium N/A N/A Exudate Type: Serosanguineous N/A N/A Exudate Color: red, brown N/A N/A Wound  Margin: Flat and Intact N/A N/A Granulation Amount: Medium (34-66%) N/A N/A Granulation Quality: Red, Pink N/A N/A Necrotic Amount: Medium (34-66%) N/A N/A Exposed Structures: Fat Layer (Subcutaneous Tissue): N/A N/A Yes Fascia: No Tendon: No Muscle: No Joint: No Bone: No Epithelialization: Large (67-100%) N/A N/A Treatment Notes Electronic Signature(s) Signed: 04/20/2021 4:00:05 PM By: Gretta Cool, BSN, RN, CWS, Kim RN, BSN Entered By: Gretta Cool, BSN, RN, CWS, Kim on 04/20/2021 09:14:01 [name] ([id number]) -------------------------------------------------------------------------------- [place] Details Patient Name: KAENAN, JAKE [name]. Date of Service: 04/20/2021 9:00 AM Medical Record Number: [id number] Patient Account Number: [id number] Date of Birth/Sex: 10-02-1970 (50 y.o. M) Treating RN: Cornell Barman Primary Care Valentina Alcoser: PATIENT, NO Other Clinician: Referring Weltha Cathy: Barkley Boards Treating Kira Hartl/Extender: Skipper Cliche in Treatment: 14 Active Inactive Wound/Skin Impairment Nursing Diagnoses: Knowledge deficit related to ulceration/compromised skin integrity Goals: Patient/caregiver will verbalize understanding of skin care regimen Date Initiated: 01/12/2021 Date Inactivated: 02/23/2021 Target Resolution Date: 02/12/2021 Goal Status: Met Ulcer/skin breakdown will have a volume reduction of 30% by week 4 Date Initiated: 01/12/2021 Date Inactivated: 02/23/2021 Target Resolution Date: 02/12/2021 Goal Status:  Met Ulcer/skin breakdown will have a volume reduction of 50% by week 8 Date Initiated: 01/12/2021 Date Inactivated: 03/16/2021 Target Resolution Date: 03/14/2021 Goal Status: Unmet Unmet Reason: comorbities Ulcer/skin breakdown will have a volume reduction of 80% by week 12 Date Initiated: 01/12/2021 Target Resolution Date: 04/14/2021 Goal Status: Active Ulcer/skin breakdown will heal within 14 weeks Date Initiated: 01/12/2021 Target Resolution Date: 05/15/2021 Goal Status: Active Interventions: Assess patient/caregiver ability to obtain necessary supplies Assess patient/caregiver ability to perform ulcer/skin care regimen upon admission and as needed Assess ulceration(s) every visit Notes: Electronic Signature(s) Signed: 04/20/2021 4:00:05 PM By: Gretta Cool, BSN, RN, CWS, Kim RN, BSN Entered By: Gretta Cool, BSN, RN, CWS, Kim on 04/20/2021 09:13:53 Stavely, Kethan Carlean Jews ([id number]) -------------------------------------------------------------------------------- Pain Assessment Details Patient Name: DOMNICK, CHERVENAK [name]. Date of Service: 04/20/2021 9:00 AM Medical Record Number: [id number] Patient Account Number: [id number] Date of Birth/Sex: [birth year] (50 y.o. M) Treating RN: Cornell Barman Primary Care Jleigh Striplin: PATIENT, NO Other Clinician: Referring Veyda Kaufman: Barkley Boards Treating Braxton Weisbecker/Extender: Skipper Cliche in Treatment: 14 Active Problems Location of Pain Severity and Description of Pain Patient Has Paino No Site Locations Pain Management and Medication Current Pain Management: Electronic Signature(s) Signed: 04/20/2021 4:00:05 PM By: Gretta Cool, BSN, RN, CWS, Kim RN, BSN Entered By: Gretta Cool, BSN, RN, CWS, Kim on 04/20/2021 09:09:11 [name] ([id number]) -------------------------------------------------------------------------------- Patient/Caregiver Education Details Patient Name: JONNY, DEARDEN [name]. Date of Service: 04/20/2021 9:00 AM Medical Record Number: [id number] Patient Account Number:  [id number] Date of Birth/Gender: 08-25-1971 (50 y.o. M) Treating RN: Cornell Barman Primary Care Physician: PATIENT, NO Other Clinician: Referring Physician: Barkley Boards Treating Physician/Extender: Skipper Cliche in Treatment: 14 Education Assessment Education Provided To: Patient Education Topics Provided Wound/Skin Impairment: Handouts: Caring for Your Ulcer, Other: continue wound care as prescribed Methods: Demonstration, Explain/Verbal Responses: State content correctly Electronic Signature(s) Signed: 04/20/2021 4:00:05 PM By: Gretta Cool, BSN, RN, CWS, Kim RN, BSN Entered By: Gretta Cool, BSN, RN, CWS, Kim on 04/20/2021 09:27:15 [name] ([id number]) -------------------------------------------------------------------------------- Wound Assessment Details Patient Name: KEISHAUN, HAZEL [name]. Date of Service: 04/20/2021 9:00 AM Medical Record Number: [id number] Patient Account Number: [id number] Date of Birth/Sex: 11/25/1970 (50 y.o. M) Treating RN: Cornell Barman Primary Care Math Brazie: PATIENT, NO Other Clinician: Referring Brindle Leyba: Barkley Boards Treating Shazia Mitchener/Extender: Skipper Cliche in Treatment: 14 Wound Status Wound Number: 1 Primary Etiology: Venous Leg Ulcer Wound Location: Right, Medial Lower Leg Wound Status:  Open Wounding Event: Gradually Appeared Date Acquired: 11/24/2020 Weeks Of Treatment: 14 Clustered Wound: No Photos Wound Measurements Length: (cm) 0.8 Width: (cm) 1.1 Depth: (cm) 0.1 Area: (cm) 0.691 Volume: (cm) 0.069 % Reduction in Area: 18.5% % Reduction in Volume: 59.4% Epithelialization: Large (67-100%) Tunneling: No Undermining: No Wound Description Classification: Full Thickness Without Exposed Support Structu Wound Margin: Flat and Intact Exudate Amount: Medium Exudate Type: Serosanguineous Exudate Color: red, brown res Foul Odor After Cleansing: No Slough/Fibrino Yes Wound Bed Granulation Amount: None Present (0%) Exposed Structure Necrotic  Amount: Large (67-100%) Fascia Exposed: No Necrotic Quality: Adherent Slough Fat Layer (Subcutaneous Tissue) Exposed: Yes Tendon Exposed: No Muscle Exposed: No Joint Exposed: No Bone Exposed: No Treatment Notes Wound #1 (Lower Leg) Wound Laterality: Right, Medial Cleanser Soap and Water Discharge Instruction: Gently cleanse wound with antibacterial soap, rinse and pat dry prior to dressing wounds Peri-Wound Care KOLE, HILYARD [name]. ([id number]) Topical Clobetasol Propionate ointment 0.05%, 60 (g) tube Discharge Instruction: Apply to wound bed and around wound Primary Dressing Hydrofera Blue Ready Transfer Foam, 2.5x2.5 (in/in) Discharge Instruction: Apply Hydrofera Blue Ready to wound bed as directed Secondary Dressing Bordered Gauze Sterile-HBD 4x4 (in/in) Discharge Instruction: Cover wound with Bordered Guaze Sterile as directed Secured With Compression Wrap Compression Stockings Environmental education officer) Signed: 04/20/2021 4:00:05 PM By: Gretta Cool, BSN, RN, CWS, Kim RN, BSN Entered By: Gretta Cool, BSN, RN, CWS, Kim on 04/20/2021 09:17:10 [place], Vander ([id number]) -------------------------------------------------------------------------------- [place] Details Patient Name: [name]. Date of Service: 04/20/2021 9:00 AM Medical Record Number: [id number] Patient Account Number: [id number] Date of Birth/Sex: 1971-05-26 (50 y.o. M) Treating RN: Cornell Barman Primary Care Ziggy Chanthavong: PATIENT, NO Other Clinician: Referring Carole Doner: Barkley Boards Treating Corey Laski/Extender: Skipper Cliche in Treatment: 14 Vital Signs Time Taken: 09:08 Temperature (F): 98.4 Height (in): 78 Pulse (bpm): 60 Weight (lbs): 249 Respiratory Rate (breaths/min): 18 Body Mass Index (BMI): 28.8 Blood Pressure (mmHg): 143/76 Reference Range: 80 - 120 mg / dl Electronic Signature(s) Signed: 04/20/2021 4:00:05 PM By: Gretta Cool, BSN, RN, CWS, Kim RN, BSN Entered By: Gretta Cool, BSN, RN, CWS, Kim on 04/20/2021  09:09:01

## 2021-04-27 ENCOUNTER — Encounter: Payer: Self-pay | Attending: Physician Assistant | Admitting: Physician Assistant

## 2021-04-27 ENCOUNTER — Other Ambulatory Visit: Payer: Self-pay

## 2021-04-27 DIAGNOSIS — I872 Venous insufficiency (chronic) (peripheral): Secondary | ICD-10-CM | POA: Insufficient documentation

## 2021-04-27 DIAGNOSIS — L97812 Non-pressure chronic ulcer of other part of right lower leg with fat layer exposed: Secondary | ICD-10-CM | POA: Insufficient documentation

## 2021-04-27 NOTE — Progress Notes (Addendum)
MAVERICK, PONTING ([id number]) Visit Report for 04/27/2021 Chief Complaint Document Details Patient Name: [name], [name]. Date of Service: 04/27/2021 8:45 AM Medical Record Number: [id number] Patient Account Number: [id number] Date of Birth/Sex: Sep 22, 1970 (50 y.o. M) Treating RN: Dolan Amen Primary Care Provider: PATIENT, NO Other Clinician: Referring Provider: Barkley Boards Treating Provider/Extender: Skipper Cliche in Treatment: 15 Information Obtained from: Patient Chief Complaint Right LE Ulcer Electronic Signature(s) Signed: 04/27/2021 8:52:44 AM By: Worthy Keeler PA-C Entered By: Worthy Keeler on 04/27/2021 08:52:44 [place], [place] ([id number]) -------------------------------------------------------------------------------- HPI Details Patient Name: Nicolas Cousin L. Date of Service: 04/27/2021 8:45 AM Medical Record Number: [id number] Patient Account Number: [id number] Date of Birth/Sex: [birth year] (50 y.o. M) Treating RN: Dolan Amen Primary Care Provider: PATIENT, NO Other Clinician: Referring Provider: Barkley Boards Treating Provider/Extender: Skipper Cliche in Treatment: 15 History of Present Illness HPI Description: 01/12/2021 upon evaluation today patient appears to be doing somewhat poorly in regard to a wound on his right medial lower leg. Currently he tells me that this has been going on since around the beginning of April. He has done a telehealth visit with a physician who initially gave him a triamcinolone cream along with Keflex for 5 days. Subsequently he ended up being seen at walk-in urgent care where they also [name] Keflex for 7 days that seem to be doing better for him. Overall he tells me that things have improved from the standpoint of redness. Fortunately there does not appear to be any signs of systemic infection to be honest right now even locally I do not see any evidence of infection right now. He does have 1 main wound with some scattering areas  that look like they may have been small ulcerations but again for the most part these have filled in. This does look almost to be more of a vasculitis type scenario based on what I am seeing. Patient does have chronic venous insufficiency but is not wearing any compression even though it sounds like he has been told before he should. 5/27; patient with small wounds on his right medial ankle. He has chronic venous insufficiency and stasis dermatitis a large open wound and several smaller areas all of which looks better than on presentation last week according to her nursing staff 01/26/2021 upon evaluation today patient appears to be doing well with regard to his wound. He is making good progress. With that being said there is some need currently for sharp debridement in regard to the wound there is some necrotic debris it is almost completely cleaned off but not completely. He is in agreement with that plan today. Fortunately there does not appear to be any evidence of active infection which is great and I am very pleased in that regard. He still wishes he did not have to wear the compression wrap. He did get his compression socks from [facility] though they are in the 15 to 20 mmHg range they did not have any his length as he is a tall guy in the 20 to 30 mmHg range. 02/02/2021 upon evaluation today patient appears to be doing excellent in regard to his leg ulcer. He has been tolerating the dressing changes without complication. Fortunately there is no evidence of active infection at this time. No fevers, chills, nausea, vomiting, or diarrhea. 02/09/2021 upon evaluation today patient's wound actually showing signs of improvement. Fortunately there does not appear to be any evidence of active infection which is great news overall very pleased with where  things stand today. 02/16/2021 upon evaluation today patient's wound is showing some signs of improvement. He did have a fairly aggressive debridement  last week he notes that he had quite a bit of discomfort he was some swelling following. With that being said I think this was to be expected considering what we had to do from a debridement standpoint. The good news is there does not appear to be any evidence of infection currently nonetheless I do believe that the patient may have some signs of vasculitis here. Based on the overall appearance of the wound today and beginning to suspect this. For that reason I think he would benefit from starting him on triamcinolone ointment to the wound bed and some of the immediate periwound to try to help out in this regard. 02/23/2021 upon evaluation today patient appears to be doing well with regard to his wound. He is actually making some progress here. The wound is roughly about the same size but the overall appearance of the wound bed is much improved. I do not see any signs of active infection at this time which is great news. No fevers, chills, nausea, vomiting, or diarrhea. 03/02/2021 upon evaluation today patient appears to be doing well with regard to his wound. I think the biggest issue I see right now is that with Hydrofera Blue this wound is staying somewhat dry. I think he could benefit from the use of collagen to try to help with getting this to stimulate some additional growth. He is in agreement with the plan. I think this also had a little bit more moisture which would be helpful at this point. 03/09/2021 on evaluation today patient appears to be doing about the same in regard to his wound is measuring a little bit smaller but still he does have the surface of the wound which is not quite as healthy as I would like to see. Fortunately there does not appear to be any active infection at this time. No fevers, chills, nausea, vomiting, or diarrhea. 03/16/2021 upon evaluation today patient appears to be doing better with regard to the overall appearance of his wound bed. There does not appear to be any  signs of significant slough buildup I think the Iodoflex has done very well for him. The patient's happy to hear this and see the improvement. With that being said he is continuing to have a little bit of expansion of the wound again I think this is more related to #1 edema control and #2 the fact that again we will get the wound bed clean. I feel like were pretty much today as far as cleaning up the wound bed is concerned now we just need to make sure the edema is optimized. I am just not certain that his compression socks are doing the job. 03/23/2021 upon evaluation today patient unfortunately appears to be doing a little bit worse with regard to his wound I was actually expecting this to be much better this week. With that being said I am getting concerned about the possibility of pyoderma gangrenosum. I did print off some information for the patient in this regard. Subsequently I am also can see about starting him on some steroid cream as well as an oral steroid. 03/30/2021 upon evaluation today patient appears to be doing significantly better in regard to his wound. He has started using the clobetasol which seems to have made a excellent improvement in regard to the wound today. 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 Either way at this point I would recommend that we avoid sharp debridements I think that is probably can be the best option based on what I am seeing currently. The patient voiced understanding. He also had questions about taking the prednisone with regard to his immune system. I explained that I do believe that that can be an issue long-term but for short-term which is mainly what we are doing here I do not think it is going to be a major issue for him and in fact I think would help more especially considering  the pyoderma here and the fact that is more of an autoimmune type issue than not using the prednisone. 04/06/2021 upon evaluation today patient's wound actually showed signs of excellent improvement. This definitely has gone a lot better since we started with the topical steroids and overall I am extremely pleased with where things stand. No fevers, chills, nausea, vomiting, or diarrhea. With that being said the patient continues to feel much better and states that the pain is actually dramatically improved as well. This definitely appears [name], [name] ([id number]) to be a pyoderma situation. 04/13/2021 upon evaluation today patient appears to be doing well with regard to his wound. Fortunately there does not appear to be any signs of active infection at this time. No fevers, chills, nausea, vomiting, or diarrhea. With that being said I do believe that he is overall doing extremely well. This is measuring smaller and does seem to be headed in the right direction. 04/20/2021 upon evaluation today patient appears to be doing well with regard to his wound. He still has quite a bit of slough covering the surface of the wound. With that being said we previously debrided the wound he actually had a bit of worsening and therefore I am somewhat reluctant to proceed with any further debridement at this point. In fact this got quite a bit larger postdebridement. 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 Nonetheless I think it is can to take time to get this to heal. 04/27/2021 upon evaluation today patient appears to be doing well with regard to his wound. This is measuring smaller this week yet again and overall I am extremely pleased with where we stand currently. Fortunately there does not appear to be any evidence of active infection which is great news. No fevers, chills, nausea, vomiting, or diarrhea. Electronic Signature(s) Signed: 04/27/2021 9:27:35 AM By: Worthy Keeler PA-C Entered By:  Worthy Keeler on 04/27/2021 09:27:35 [name] ([id number]) -------------------------------------------------------------------------------- Physical Exam Details Patient Name: [name], [name] Date of Service: 04/27/2021 8:45 AM Medical Record Number: [id number] Patient Account Number: [id number] Date of Birth/Sex: 1970-11-04 (50 y.o. M) Treating RN: Dolan Amen Primary Care Provider: PATIENT, NO Other Clinician: Referring Provider: Barkley Boards Treating Provider/Extender: Skipper Cliche in Treatment: 48 Constitutional Well-nourished and well-hydrated in no acute distress. Respiratory normal breathing without difficulty. Psychiatric this patient is able to make decisions and demonstrates good insight into disease process. Alert and Oriented x 3. pleasant and cooperative. Notes Upon inspection patient's wound bed showed signs of good granulation and some areas he did have some slough noted I did use a saline and gauze to clear some of this away but to be honest he still is fairly stuck on still the wound is getting  smaller with treatment currently and I think the biggest thing would be for him to wash it well when he gets out of the shower using saline and a Q-tip along with gauze to wrap around the Q-tip and then work it over top in a circular motion of the wound. This will help get off any of the excess life other than that everything seems to be doing quite well. Electronic Signature(s) Signed: 04/27/2021 9:28:14 AM By: Worthy Keeler PA-C Entered By: Worthy Keeler on 04/27/2021 09:28:14 [name] ([id number]) -------------------------------------------------------------------------------- Physician Orders Details Patient Name: [name], [name] Date of Service: 04/27/2021 8:45 AM Medical Record Number: [id number] Patient Account Number: [id number] Date of Birth/Sex: 04-Feb-1971 (50 y.o. M) Treating RN: Carlene Coria Primary Care Provider: PATIENT, NO Other  Clinician: Referring Provider: Barkley Boards Treating Provider/Extender: Skipper Cliche in Treatment: 15 Verbal / Phone Orders: No Diagnosis Coding ICD-10 Coding Code Description I87.2 Venous insufficiency (chronic) (peripheral) L97.812 Non-pressure chronic ulcer of other part of right lower leg with fat layer exposed Follow-up Appointments o Return Appointment in 1 week. Bathing/ Shower/ Hygiene o May shower; gently cleanse wound with antibacterial soap, rinse and pat dry prior to dressing wounds Edema Control - Lymphedema / Segmental Compressive Device / Other Bilateral Lower Extremities o Patient to wear own compression stockings. Remove compression stockings every night before going to bed and put on every morning when getting up. o Elevate, Exercise Daily and Avoid Standing for Long Periods of Time. o Elevate legs to the level of the heart and pump ankles as often as possible o Elevate leg(s) parallel to the floor when sitting. Wound Treatment Wound #1 - Lower Leg Wound Laterality: Right, Medial Cleanser: Soap and Water 1 x Per Day/30 Days Discharge Instructions: Gently cleanse wound with antibacterial soap, rinse and pat dry prior to dressing wounds Topical: Clobetasol Propionate ointment 0.05%, 60 (g) tube 1 x Per Day/30 Days Discharge Instructions: Apply to wound bed and around wound Primary Dressing: Hydrofera Blue Ready Transfer Foam, 2.5x2.5 (in/in) 1 x Per Day/30 Days Discharge Instructions: Apply Hydrofera Blue Ready to wound bed as directed Secondary Dressing: Bordered Gauze Sterile-HBD 4x4 (in/in) 1 x Per Day/30 Days Discharge Instructions: Cover wound with Bordered Guaze Sterile as directed Electronic Signature(s) Signed: 04/27/2021 4:48:27 PM By: Worthy Keeler PA-C Signed: 04/27/2021 7:59:11 PM By: Carlene Coria RN Entered By: Carlene Coria on 04/27/2021 09:20:43 [name], [name]  ([id number]) -------------------------------------------------------------------------------- Problem List Details Patient Name: Nicolas Cousin L. Date of Service: 04/27/2021 8:45 AM Medical Record Number: [id number] Patient Account Number: [id number] Date of Birth/Sex: [birth year] (50 y.o. M) Treating RN: Dolan Amen Primary Care Provider: PATIENT, NO Other Clinician: Referring Provider: Barkley Boards Treating Provider/Extender: Skipper Cliche in Treatment: 15 Active Problems ICD-10 Encounter Code Description Active Date MDM Diagnosis I87.2 Venous insufficiency (chronic) (peripheral) 01/12/2021 No Yes L97.812 Non-pressure chronic ulcer of other part of right lower leg with fat layer 01/12/2021 No Yes exposed Inactive Problems Resolved Problems Electronic Signature(s) Signed: 04/27/2021 8:52:39 AM By: Worthy Keeler PA-C Entered By: Worthy Keeler on 04/27/2021 08:52:39 [name], [name] ([id number]) -------------------------------------------------------------------------------- Progress Note Details Patient Name: Nicolas Cousin L. Date of Service: 04/27/2021 8:45 AM Medical Record Number: [id number] Patient Account Number: [id number] Date of Birth/Sex: 1971/05/10 (50 y.o. M) Treating RN: Dolan Amen Primary Care Provider: PATIENT, NO Other Clinician: Referring Provider: Barkley Boards Treating Provider/Extender: Skipper Cliche in Treatment: 15 Subjective Chief Complaint Information obtained from Patient Right LE Ulcer History of Present Illness (  HPI) 01/12/2021 upon evaluation today patient appears to be doing somewhat poorly in regard to a wound on his right medial lower leg. Currently he tells me that this has been going on since around the beginning of April. He has done a telehealth visit with a physician who initially gave him a triamcinolone cream along with Keflex for 5 days. Subsequently he ended up being seen at walk-in urgent care where they also [name] Keflex for 7 days  that seem to be doing better for him. Overall he tells me that things have improved from the standpoint of redness. Fortunately there does not appear to be any signs of systemic infection to be honest right now even locally I do not see any evidence of infection right now. He does have 1 main wound with some scattering areas that look like they may have been small ulcerations but again for the most part these have filled in. This does look almost to be more of a vasculitis type scenario based on what I am seeing. Patient does have chronic venous insufficiency but is not wearing any compression even though it sounds like he has been told before he should. 5/27; patient with small wounds on his right medial ankle. He has chronic venous insufficiency and stasis dermatitis a large open wound and several smaller areas all of which looks better than on presentation last week according to her nursing staff 01/26/2021 upon evaluation today patient appears to be doing well with regard to his wound. He is making good progress. With that being said there is some need currently for sharp debridement in regard to the wound there is some necrotic debris it is almost completely cleaned off but not completely. He is in agreement with that plan today. Fortunately there does not appear to be any evidence of active infection which is great and I am very pleased in that regard. He still wishes he did not have to wear the compression wrap. He did get his compression socks from [facility] therapy though they are in the 15 to 20 mmHg range they did not have any his length as he is a tall guy in the 20 to 30 mmHg range. 02/02/2021 upon evaluation today patient appears to be doing excellent in regard to his leg ulcer. He has been tolerating the dressing changes without complication. Fortunately there is no evidence of active infection at this time. No fevers, chills, nausea, vomiting, or diarrhea. 02/09/2021 upon evaluation today  patient's wound actually showing signs of improvement. Fortunately there does not appear to be any evidence of active infection which is great news overall very pleased with where things stand today. 02/16/2021 upon evaluation today patient's wound is showing some signs of improvement. He did have a fairly aggressive debridement last week he notes that he had quite a bit of discomfort he was some swelling following. With that being said I think this was to be expected considering what we had to do from a debridement standpoint. The good news is there does not appear to be any evidence of infection currently nonetheless I do believe that the patient may have some signs of vasculitis here. Based on the overall appearance of the wound today and beginning to suspect this. For that reason I think he would benefit from starting him on triamcinolone ointment to the wound bed and some of the immediate periwound to try to help out in this regard. 02/23/2021 upon evaluation today patient appears to be doing well with regard to his  wound. He is actually making some progress here. The wound is roughly about the same size but the overall appearance of the wound bed is much improved. I do not see any signs of active infection at this time which is great news. No fevers, chills, nausea, vomiting, or diarrhea. 03/02/2021 upon evaluation today patient appears to be doing well with regard to his wound. I think the biggest issue I see right now is that with Hydrofera Blue this wound is staying somewhat dry. I think he could benefit from the use of collagen to try to help with getting this to stimulate some additional growth. He is in agreement with the plan. I think this also had a little bit more moisture which would be helpful at this point. 03/09/2021 on evaluation today patient appears to be doing about the same in regard to his wound is measuring a little bit smaller but still he does have the surface of the wound which  is not quite as healthy as I would like to see. Fortunately there does not appear to be any active infection at this time. No fevers, chills, nausea, vomiting, or diarrhea. 03/16/2021 upon evaluation today patient appears to be doing better with regard to the overall appearance of his wound bed. There does not appear to be any signs of significant slough buildup I think the Iodoflex has done very well for him. The patient's happy to hear this and see the improvement. With that being said he is continuing to have a little bit of expansion of the wound again I think this is more related to #1 edema control and #2 the fact that again we will get the wound bed clean. I feel like were pretty much today as far as cleaning up the wound bed is concerned now we just need to make sure the edema is optimized. I am just not certain that his compression socks are doing the job. 03/23/2021 upon evaluation today patient unfortunately appears to be doing a little bit worse with regard to his wound I was actually expecting this to be much better this week. With that being said I am getting concerned about the possibility of pyoderma gangrenosum. I did print off some information for the patient in this regard. Subsequently I am also can see about starting him on some steroid cream as well as an oral steroid. 03/30/2021 upon evaluation today patient appears to be doing significantly better in regard to his wound. He has started using the clobetasol which seems to have made a excellent improvement in regard to the wound today. 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 Either way at this point I would recommend that we avoid sharp debridements I think that is probably can be the best option based on what I am seeing currently. The patient voiced  understanding. He also had questions about taking the prednisone with regard to his immune system. I explained that I do believe that that can be an issue long-term but for short-term which is mainly what we are doing here I do not think it is going to be a major issue for him and in fact I think would help more especially considering the pyoderma here and the fact that is more of an [place], [place] ([id number]) autoimmune type issue than not using  the prednisone. 04/06/2021 upon evaluation today patient's wound actually showed signs of excellent improvement. This definitely has gone a lot better since we started with the topical steroids and overall I am extremely pleased with where things stand. No fevers, chills, nausea, vomiting, or diarrhea. With that being said the patient continues to feel much better and states that the pain is actually dramatically improved as well. 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 04/13/2021 upon evaluation today patient appears to be doing well with regard to his wound. Fortunately there does not appear to be any signs of active infection at this time. No fevers, chills, nausea, vomiting, or diarrhea. With that being said I do believe that he is overall doing extremely well. This is measuring smaller and does seem to be headed in the right direction. 04/20/2021 upon evaluation today patient appears to be doing well with regard to his wound. He still has quite a bit of slough covering the surface of the wound. With that being said we previously debrided the wound he actually had a bit of worsening and therefore I am somewhat reluctant to proceed with any further debridement at this point. In fact this got quite a bit larger postdebridement. 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 Nonetheless I think it is can to take time to get this to heal. 04/27/2021 upon evaluation today patient appears to be doing well with regard to his wound. This is  measuring smaller this week yet again and overall I am extremely pleased with where we stand currently. Fortunately there does not appear to be any evidence of active infection which is great news. No fevers, chills, nausea, vomiting, or diarrhea. Objective Constitutional Well-nourished and well-hydrated in no acute distress. Vitals Time Taken: 9:12 AM, Height: 78 in, Weight: 249 lbs, BMI: 28.8, Temperature: 98.2 F, Pulse: 65 bpm, Respiratory Rate: 18 breaths/min, Blood Pressure: 143/81 mmHg. Respiratory normal breathing without difficulty. Psychiatric this patient is able to make decisions and demonstrates good insight into disease process. Alert and Oriented x 3. pleasant and cooperative. General Notes: Upon inspection patient's wound bed showed signs of good granulation and some areas he did have some slough noted I did use a saline and gauze to clear some of this away but to be honest he still is fairly stuck on still the wound is getting smaller with treatment currently and I think the biggest thing would be for him to wash it well when he gets out of the shower using saline and a Q-tip along with gauze to wrap around the Q-tip and then work it over top in a circular motion of the wound. This will help get off any of the excess life other than that everything seems to be doing quite well. Integumentary (Hair, Skin) Wound #1 status is Open. Original cause of wound was Gradually Appeared. The date acquired was: 11/24/2020. The wound has been in treatment 15 weeks. The wound is located on the Right,Medial Lower Leg. The wound measures 0.6cm length x 0.9cm width x 0.1cm depth; 0.424cm^2 area and 0.042cm^3 volume. There is Fat Layer (Subcutaneous Tissue) exposed. There is no tunneling or undermining noted. There is a medium amount of serosanguineous drainage noted. The wound margin is flat and intact. There is no granulation within the wound bed. There is a large (67-100%) amount of necrotic  tissue within the wound bed including Adherent Slough. Assessment Active Problems ICD-10 Venous insufficiency (chronic) (peripheral) Non-pressure chronic ulcer of other part of right lower leg  with fat layer exposed [name], [name] ([id number]) Follow-up Appointments: Return Appointment in 1 week. Bathing/ Shower/ Hygiene: May shower; gently cleanse wound with antibacterial soap, rinse and pat dry prior to dressing wounds Edema Control - Lymphedema / Segmental Compressive Device / Other: Patient to wear own compression stockings. Remove compression stockings every night before going to bed and put on every morning when getting up. Elevate, Exercise Daily and Avoid Standing for Long Periods of Time. Elevate legs to the level of the heart and pump ankles as often as possible Elevate leg(s) parallel to the floor when sitting. WOUND #1: - Lower Leg Wound Laterality: Right, Medial Cleanser: Soap and Water 1 x Per Day/30 Days Discharge Instructions: Gently cleanse wound with antibacterial soap, rinse and pat dry prior to dressing wounds Topical: Clobetasol Propionate ointment 0.05%, 60 (g) tube 1 x Per Day/30 Days Discharge Instructions: Apply to wound bed and around wound Primary Dressing: Hydrofera Blue Ready Transfer Foam, 2.5x2.5 (in/in) 1 x Per Day/30 Days Discharge Instructions: Apply Hydrofera Blue Ready to wound bed as directed Secondary Dressing: Bordered Gauze Sterile-HBD 4x4 (in/in) 1 x Per Day/30 Days Discharge Instructions: Cover wound with Bordered Guaze Sterile as directed 1. Would recommend currently that we going to continue with wound care measures as before and the patient is in agreement the plan this includes the use of the clobetasol cream which is doing awesome job in helping to clear up the inflammation and concern to the wound. 2. I am also going to recommend that we have the patient continue with the [facility] to cover which I think is doing a great job as  well. 3. I am also can recommend that we have the patient continue to monitor for any signs of worsening from the standpoint of infection right now I see no issues with infection but we will keep a close eye on things. We will see patient back for reevaluation in 1 week here in the clinic. If anything worsens or changes patient will contact our office for additional recommendations. Electronic Signature(s) Signed: 04/27/2021 9:28:57 AM By: Worthy Keeler PA-C Entered By: Worthy Keeler on 04/27/2021 09:28:57 [name] ([id number]) -------------------------------------------------------------------------------- SuperBill Details Patient Name: Nicolas Cousin L. Date of Service: 04/27/2021 Medical Record Number: [id number] Patient Account Number: [id number] Date of Birth/Sex: [birth year] (50 y.o. 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

## 2021-04-27 NOTE — Progress Notes (Addendum)
JADORE, VEALS ([id number]) Visit Report for 04/27/2021 Arrival Information Details Patient Name: [name], [name]. Date of Service: 04/27/2021 8:45 AM Medical Record Number: [id number] Patient Account Number: [id number] Date of Birth/Sex: 1971/07/17 (50 y.o. M) Treating RN: Carlene Coria Primary Care Adelisa Satterwhite: PATIENT, NO Other Clinician: Referring Yamilka Lopiccolo: Barkley Boards Treating Bentleigh Waren/Extender: Skipper Cliche in Treatment: 15 Visit Information History Since Last Visit All ordered tests and consults were completed: No Patient Arrived: Ambulatory Added or deleted any medications: No Arrival Time: 09:09 Any new allergies or adverse reactions: No Accompanied By: self Had a fall or experienced change in No Transfer Assistance: None activities of daily living that may affect Patient Identification Verified: Yes risk of falls: Secondary Verification Process Completed: Yes Signs or symptoms of abuse/neglect since last visito No Patient Requires Transmission-Based Precautions: No Hospitalized since last visit: No Patient Has Alerts: Yes Implantable device outside of the clinic excluding No Patient Alerts: NOT DIABETIC cellular tissue based products placed in the center since last visit: Has Dressing in Place as Prescribed: Yes Pain Present Now: No Electronic Signature(s) Signed: 04/27/2021 7:59:11 PM By: Carlene Coria RN Entered By: Carlene Coria on 04/27/2021 09:12:32 [place], [place]. ([id number]) -------------------------------------------------------------------------------- Clinic Level of Care Assessment Details Patient Name: Nicolas Cousin L. Date of Service: 04/27/2021 8:45 AM Medical Record Number: [id number] Patient Account Number: [id number] Date of Birth/Sex: 04-17-1971 (50 y.o. M) Treating RN: Carlene Coria Primary Care Berkeley Veldman: PATIENT, NO Other Clinician: Referring Garth Diffley: Barkley Boards Treating Castella Lerner/Extender: Skipper Cliche in Treatment: 15 Clinic Level of Care  Assessment Items TOOL 4 Quantity Score X - Use when only an EandM is performed on FOLLOW-UP visit 1 0 ASSESSMENTS - Nursing Assessment / Reassessment X - Reassessment of Co-morbidities (includes updates in patient status) 1 10 X- 1 5 Reassessment of Adherence to Treatment Plan ASSESSMENTS - Wound and Skin Assessment / Reassessment X - Simple Wound Assessment / Reassessment - one wound 1 5 _0  - 0 Complex Wound Assessment / Reassessment - multiple wounds _1  - 0 Dermatologic / Skin Assessment (not related to wound area) ASSESSMENTS - Focused Assessment _2  - Circumferential Edema Measurements - multi extremities 0 _3  - 0 Nutritional Assessment / Counseling / Intervention _4  - 0 Lower Extremity Assessment (monofilament, tuning fork, pulses) _5  - 0 Peripheral Arterial Disease Assessment (using hand held doppler) ASSESSMENTS - Ostomy and/or Continence Assessment and Care _6  - Incontinence Assessment and Management 0 _7  - 0 Ostomy Care Assessment and Management (repouching, etc.) PROCESS - Coordination of Care X - Simple Patient / Family Education for ongoing care 1 15 _8  - 0 Complex (extensive) Patient / Family Education for ongoing care _9  - 0 Staff obtains Programmer, systems, Records, Test Results / Process Orders _10  - 0 Staff telephones HHA, Nursing Homes / Clarify orders / etc _11  - 0 Routine Transfer to another Facility (non-emergent condition) _12  - 0 Routine Hospital Admission (non-emergent condition) _13  - 0 New Admissions / Biomedical engineer / Ordering NPWT, Apligraf, etc. _14  - 0 Emergency Hospital Admission (emergent condition) X- 1 10 Simple Discharge Coordination _15  - 0 Complex (extensive) Discharge Coordination PROCESS - Special Needs _16  - Pediatric / Minor Patient Management 0 _17  - 0 Isolation Patient Management _18  - 0 Hearing / Language / Visual special needs _19  - 0 Assessment of Community assistance (transportation, D/C planning, etc.) _20  - 0 Additional  assistance / Altered mentation _21  - 0 Support Surface(s) Assessment (bed, cushion, seat, etc.) INTERVENTIONS - Wound Cleansing / Measurement Hue, Arsh L. ([id number]) X- 1 5  Simple Wound Cleansing - one wound _0  - 0 Complex Wound Cleansing - multiple wounds X- 1 5 Wound Imaging (photographs - any number of wounds) _1  - 0 Wound Tracing (instead of photographs) X- 1 5 Simple Wound Measurement - one wound _2  - 0 Complex Wound Measurement - multiple wounds INTERVENTIONS - Wound Dressings X - Small Wound Dressing one or multiple wounds 1 10 _3  - 0 Medium Wound Dressing one or multiple wounds _4  - 0 Large Wound Dressing one or multiple wounds X- 1 5 Application of Medications - topical <[id number]>_1<\/YNWGNFAOZHYQMVHQ>_4  - 0 Application of Medications - injection INTERVENTIONS - Miscellaneous _6  - External ear exam 0 _7  - 0 Specimen Collection (cultures, biopsies, blood, body fluids, etc.) _8  - 0 Specimen(s) / Culture(s) sent or taken to Lab for analysis _9  - 0 Patient Transfer (multiple staff / Civil Service fast streamer / Similar devices) _10  - 0 Simple Staple / Suture removal (25 or less) _11  - 0 Complex Staple / Suture removal (26 or more) _12  - 0 Hypo / Hyperglycemic Management (close monitor of Blood Glucose) _13  - 0 Ankle / Brachial Index (ABI) - do not check if billed separately X- 1 5 Vital Signs Has the patient been seen at the hospital within the last three years: Yes Total Score: 80 Level Of Care: New/Established - Level 3 Electronic Signature(s) Signed: 04/27/2021 7:59:11 PM By: Carlene Coria RN Entered By: Carlene Coria on 04/27/2021 09:26:46 [place], [place] ([id number]) -------------------------------------------------------------------------------- Encounter Discharge Information Details Patient Name: Nicolas Cousin L. Date of Service: 04/27/2021 8:45 AM Medical Record Number: [id number] Patient Account Number: [id number] Date of Birth/Sex: 20-Aug-1971 (50 y.o. M) Treating RN: Carlene Coria Primary Care  Krysteena Stalker: PATIENT, NO Other Clinician: Referring Lenardo Westwood: Barkley Boards Treating Neng Albee/Extender: Skipper Cliche in Treatment: 15 Encounter Discharge Information Items Discharge Condition: Stable Ambulatory Status: Ambulatory Discharge Destination: Home Transportation: Private Auto Accompanied By: self Schedule Follow-up Appointment: Yes Clinical Summary of Care: Patient Declined Electronic Signature(s) Signed: 04/27/2021 7:59:11 PM By: Carlene Coria RN Entered By: Carlene Coria on 04/27/2021 09:27:41 Hagans, Drelyn L. ([id number]) -------------------------------------------------------------------------------- Lower Extremity Assessment Details Patient Name: Nicolas Cousin L. Date of Service: 04/27/2021 8:45 AM Medical Record Number: [id number] Patient Account Number: [id number] Date of Birth/Sex: November 22, 1970 (50 y.o. M) Treating RN: Carlene Coria Primary Care Kimyata Milich: PATIENT, NO Other Clinician: Referring Tamitha Norell: Barkley Boards Treating Charvez Voorhies/Extender: Skipper Cliche in Treatment: 15 Edema Assessment Assessed: [Left: No] [Right: No] Edema: [Left: Ye] [Right: s] Calf Left: Right: Point of Measurement: 40 cm From Medial Instep 35 cm Ankle Left: Right: Point of Measurement: 10 cm From Medial Instep 25 cm Vascular Assessment Pulses: Dorsalis Pedis Palpable: [Right:Yes] Electronic Signature(s) Signed: 04/27/2021 7:59:11 PM By: Carlene Coria RN Entered By: Carlene Coria on 04/27/2021 09:15:47 [place], [place] ([id number]) -------------------------------------------------------------------------------- Multi Wound Chart Details Patient Name: Nicolas Cousin L. Date of Service: 04/27/2021 8:45 AM Medical Record Number: [id number] Patient Account Number: [id number] Date of Birth/Sex: [birth year] (50 y.o. M) Treating RN: Carlene Coria Primary Care Dameian Crisman: PATIENT, NO Other Clinician: Referring Bryden Darden: Barkley Boards Treating Jeree Delcid/Extender: Skipper Cliche in Treatment: 15 Vital  Signs Height(in): 78 Pulse(bpm): 36 Weight(lbs): 249 Blood Pressure(mmHg): 143/81 Body Mass Index(BMI): 29 Temperature(F): 98.2 Respiratory Rate(breaths/min): 18 Photos: [N/A:N/A] Wound Location: Right, Medial Lower Leg N/A N/A Wounding Event: Gradually Appeared N/A N/A Primary Etiology: Venous Leg Ulcer N/A N/A Date Acquired: 11/24/2020 N/A N/A Weeks of Treatment: 15 N/A N/A Wound Status: Open N/A N/A Measurements L x W x D (cm) 0.6x0.9x0.1 N/A N/A Area (cm) :  0.424 N/A N/A Volume (cm) : 0.042 N/A N/A % Reduction in Area: 50.00% N/A N/A % Reduction in Volume: 75.30% N/A N/A Classification: Full Thickness Without Exposed N/A N/A Support Structures Exudate Amount: Medium N/A N/A Exudate Type: Serosanguineous N/A N/A Exudate Color: red, brown N/A N/A Wound Margin: Flat and Intact N/A N/A Granulation Amount: None Present (0%) N/A N/A Necrotic Amount: Large (67-100%) N/A N/A Exposed Structures: Fat Layer (Subcutaneous Tissue): N/A N/A Yes Fascia: No Tendon: No Muscle: No Joint: No Bone: No Epithelialization: Large (67-100%) N/A N/A Treatment Notes Electronic Signature(s) Signed: 04/27/2021 7:59:11 PM By: Carlene Coria RN Entered By: Carlene Coria on 04/27/2021 09:20:15 [name] ([id number]) -------------------------------------------------------------------------------- [place] Details Patient Name: Nicolas Cousin L. Date of Service: 04/27/2021 8:45 AM Medical Record Number: [id number] Patient Account Number: [id number] Date of Birth/Sex: May 20, 1971 (50 y.o. M) Treating RN: Carlene Coria Primary Care Krissi Willaims: PATIENT, NO Other Clinician: Referring Saivon Prowse: Barkley Boards Treating Cassandr Cederberg/Extender: Skipper Cliche in Treatment: 15 Active Inactive Wound/Skin Impairment Nursing Diagnoses: Knowledge deficit related to ulceration/compromised skin integrity Goals: Patient/caregiver will verbalize understanding of skin care regimen Date Initiated:  01/12/2021 Date Inactivated: 02/23/2021 Target Resolution Date: 02/12/2021 Goal Status: Met Ulcer/skin breakdown will have a volume reduction of 30% by week 4 Date Initiated: 01/12/2021 Date Inactivated: 02/23/2021 Target Resolution Date: 02/12/2021 Goal Status: Met Ulcer/skin breakdown will have a volume reduction of 50% by week 8 Date Initiated: 01/12/2021 Date Inactivated: 03/16/2021 Target Resolution Date: 03/14/2021 Goal Status: Unmet Unmet Reason: comorbities Ulcer/skin breakdown will have a volume reduction of 80% by week 12 Date Initiated: 01/12/2021 Date Inactivated: 04/27/2021 Target Resolution Date: 04/14/2021 Goal Status: Met Ulcer/skin breakdown will heal within 14 weeks Date Initiated: 01/12/2021 Target Resolution Date: 05/15/2021 Goal Status: Active Interventions: Assess patient/caregiver ability to obtain necessary supplies Assess patient/caregiver ability to perform ulcer/skin care regimen upon admission and as needed Assess ulceration(s) every visit Notes: Electronic Signature(s) Signed: 04/27/2021 7:59:11 PM By: Carlene Coria RN Entered By: Carlene Coria on 04/27/2021 09:20:05 [place], [place] ([id number]) -------------------------------------------------------------------------------- Pain Assessment Details Patient Name: Nicolas Cousin L. Date of Service: 04/27/2021 8:45 AM Medical Record Number: [id number] Patient Account Number: [id number] Date of Birth/Sex: 05-26-1971 (50 y.o. M) Treating RN: Carlene Coria Primary Care Earlisha Sharples: PATIENT, NO Other Clinician: Referring Brenya Taulbee: Barkley Boards Treating Kihanna Kamiya/Extender: Skipper Cliche in Treatment: 15 Active Problems Location of Pain Severity and Description of Pain Patient Has Paino No Site Locations Pain Management and Medication Current Pain Management: Electronic Signature(s) Signed: 04/27/2021 7:59:11 PM By: Carlene Coria RN Entered By: Carlene Coria on 04/27/2021 09:12:59 [name]  ([id number]) -------------------------------------------------------------------------------- Patient/Caregiver Education Details Patient Name: Nicolas Cousin L. Date of Service: 04/27/2021 8:45 AM Medical Record Number: [id number] Patient Account Number: [id number] Date of Birth/Gender: 06-07-1971 (50 y.o. M) Treating RN: Carlene Coria Primary Care Physician: PATIENT, NO Other Clinician: Referring Physician: Barkley Boards Treating Physician/Extender: Skipper Cliche in Treatment: 15 Education Assessment Education Provided To: Patient Education Topics Provided Wound/Skin Impairment: Methods: Explain/Verbal Responses: State content correctly Electronic Signature(s) Signed: 04/27/2021 7:59:11 PM By: Carlene Coria RN Entered By: Carlene Coria on 04/27/2021 09:27:03 [place], [place] ([id number]) -------------------------------------------------------------------------------- Wound Assessment Details Patient Name: Nicolas Cousin L. Date of Service: 04/27/2021 8:45 AM Medical Record Number: [id number] Patient Account Number: [id number] Date of Birth/Sex: [birth year] (50 y.o. M) Treating RN: Carlene Coria Primary Care Miasha Emmons: PATIENT, NO Other Clinician: Referring Milam Allbaugh: Barkley Boards Treating Redell Nazir/Extender: Skipper Cliche in Treatment: 15 Wound Status Wound Number: 1 Primary Etiology: Venous Leg Ulcer Wound  Location: Right, Medial Lower Leg Wound Status: Open Wounding Event: Gradually Appeared Date Acquired: 11/24/2020 Weeks Of Treatment: 15 Clustered Wound: No Photos Wound Measurements Length: (cm) 0.6 Width: (cm) 0.9 Depth: (cm) 0.1 Area: (cm) 0.424 Volume: (cm) 0.042 % Reduction in Area: 50% % Reduction in Volume: 75.3% Epithelialization: Large (67-100%) Tunneling: No Undermining: No Wound Description Classification: Full Thickness Without Exposed Support Structu Wound Margin: Flat and Intact Exudate Amount: Medium Exudate Type: Serosanguineous Exudate Color: red,  brown res Foul Odor After Cleansing: No Slough/Fibrino Yes Wound Bed Granulation Amount: None Present (0%) Exposed Structure Necrotic Amount: Large (67-100%) Fascia Exposed: No Necrotic Quality: Adherent Slough Fat Layer (Subcutaneous Tissue) Exposed: Yes Tendon Exposed: No Muscle Exposed: No Joint Exposed: No Bone Exposed: No Treatment Notes Wound #1 (Lower Leg) Wound Laterality: Right, Medial Cleanser Soap and Water Discharge Instruction: Gently cleanse wound with antibacterial soap, rinse and pat dry prior to dressing wounds Peri-Wound Care KEANE, MARTELLI L. ([id number]) Topical Clobetasol Propionate ointment 0.05%, 60 (g) tube Discharge Instruction: Apply to wound bed and around wound Primary Dressing Hydrofera Blue Ready Transfer Foam, 2.5x2.5 (in/in) Discharge Instruction: Apply Hydrofera Blue Ready to wound bed as directed Secondary Dressing Bordered Gauze Sterile-HBD 4x4 (in/in) Discharge Instruction: Cover wound with Bordered Guaze Sterile as directed Secured With Compression Wrap Compression Stockings Add-Ons Electronic Signature(s) Signed: 04/27/2021 7:59:11 PM By: Carlene Coria RN Entered By: Carlene Coria on 04/27/2021 09:15:07 Dignan, [place] ([id number]) -------------------------------------------------------------------------------- Vitals Details Patient Name: Nicolas Cousin L. Date of Service: 04/27/2021 8:45 AM Medical Record Number: [id number] Patient Account Number: [id number] Date of Birth/Sex: 05-02-1971 (50 y.o. M) Treating RN: Carlene Coria Primary Care Zoila Ditullio: PATIENT, NO Other Clinician: Referring Tiffine Henigan: Barkley Boards Treating Hadli Vandemark/Extender: Skipper Cliche in Treatment: 15 Vital Signs Time Taken: 09:12 Temperature (F): 98.2 Height (in): 78 Pulse (bpm): 65 Weight (lbs): 249 Respiratory Rate (breaths/min): 18 Body Mass Index (BMI): 28.8 Blood Pressure (mmHg): 143/81 Reference Range: 80 - 120 mg / dl Electronic Signature(s) Signed:  04/27/2021 7:59:11 PM By: Carlene Coria RN Entered By: Carlene Coria on 04/27/2021 09:12:53

## 2021-05-04 ENCOUNTER — Other Ambulatory Visit: Payer: Self-pay

## 2021-05-04 ENCOUNTER — Encounter: Payer: Self-pay | Admitting: Physician Assistant

## 2021-05-04 NOTE — Progress Notes (Signed)
CHEO, SELVEY ([id number]) Visit Report for 05/04/2021 Arrival Information Details Patient Name: [name], [name]. Date of Service: 05/04/2021 9:00 AM Medical Record Number: [id number] Patient Account Number: [id number] Date of Birth/Sex: 1971-05-18 (50 y.o. M) Treating RN: Dolan Amen Primary Care Christana Angelica: PATIENT, NO Other Clinician: Referring Besnik Febus: Barkley Boards Treating Joel Mericle/Extender: Skipper Cliche in Treatment: 16 Visit Information History Since Last Visit Pain Present Now: No Patient Arrived: Ambulatory Arrival Time: 09:13 Accompanied By: self Transfer Assistance: None Patient Identification Verified: Yes Secondary Verification Process Completed: Yes Patient Requires Transmission-Based Precautions: No Patient Has Alerts: Yes Patient Alerts: NOT DIABETIC Electronic Signature(s) Signed: 05/04/2021 4:22:57 PM By: Dolan Amen RN Entered By: Dolan Amen on 05/04/2021 09:13:46 [place], Gideon ([id number]) -------------------------------------------------------------------------------- Clinic Level of Care Assessment Details Patient Name: Nicolas Cousin L. Date of Service: 05/04/2021 9:00 AM Medical Record Number: [id number] Patient Account Number: [id number] Date of Birth/Sex: Mar 19, 1971 (50 y.o. M) Treating RN: Dolan Amen Primary Care Kathrina Crosley: PATIENT, NO Other Clinician: Referring Avinash Maltos: Barkley Boards Treating Kwadwo Taras/Extender: Skipper Cliche in Treatment: 16 Clinic Level of Care Assessment Items TOOL 1 Quantity Score []  - Use when EandM and Procedure is performed on INITIAL visit 0 ASSESSMENTS - Nursing Assessment / Reassessment []  - General Physical Exam (combine w/ comprehensive assessment (listed just below) when performed on new 0 pt. evals) []  - 0 Comprehensive Assessment (HX, ROS, Risk Assessments, Wounds Hx, etc.) ASSESSMENTS - Wound and Skin Assessment / Reassessment []  - Dermatologic / Skin Assessment (not related to wound area)  0 ASSESSMENTS - Ostomy and/or Continence Assessment and Care []  - Incontinence Assessment and Management 0 []  - 0 Ostomy Care Assessment and Management (repouching, etc.) PROCESS - Coordination of Care []  - Simple Patient / Family Education for ongoing care 0 []  - 0 Complex (extensive) Patient / Family Education for ongoing care []  - 0 Staff obtains Programmer, systems, Records, Test Results / Process Orders []  - 0 Staff telephones HHA, Nursing Homes / Clarify orders / etc []  - 0 Routine Transfer to another Facility (non-emergent condition) []  - 0 Routine Hospital Admission (non-emergent condition) []  - 0 New Admissions / Biomedical engineer / Ordering NPWT, Apligraf, etc. []  - 0 Emergency Hospital Admission (emergent condition) PROCESS - Special Needs []  - Pediatric / Minor Patient Management 0 []  - 0 Isolation Patient Management []  - 0 Hearing / Language / Visual special needs []  - 0 Assessment of Community assistance (transportation, D/C planning, etc.) []  - 0 Additional assistance / Altered mentation []  - 0 Support Surface(s) Assessment (bed, cushion, seat, etc.) INTERVENTIONS - Miscellaneous []  - External ear exam 0 []  - 0 Patient Transfer (multiple staff / Civil Service fast streamer / Similar devices) []  - 0 Simple Staple / Suture removal (25 or less) []  - 0 Complex Staple / Suture removal (26 or more) []  - 0 Hypo/Hyperglycemic Management (do not check if billed separately) []  - 0 Ankle / Brachial Index (ABI) - do not check if billed separately Has the patient been seen at the hospital within the last three years: Yes Total Score: 0 Level Of Care: ____ [name] ([id number]) Electronic Signature(s) Signed: 05/04/2021 4:22:57 PM By: Dolan Amen RN Entered By: Dolan Amen on 05/04/2021 [place], [place] Carlean Jews ([id number]) -------------------------------------------------------------------------------- Encounter Discharge Information Details Patient Name: Nicolas Cousin L. Date of Service: 05/04/2021 9:00 AM Medical Record Number: [id number] Patient Account Number: [id number] Date of Birth/Sex: October 23, 1970 (50 y.o. M) Treating RN: Dolan Amen Primary Care Gaelyn Tukes: PATIENT, NO Other Clinician: Referring  Talyn Dessert: Barkley Boards Treating Jamarien Rodkey/Extender: Skipper Cliche in Treatment: 16 Encounter Discharge Information Items Post Procedure Vitals Discharge Condition: Stable Temperature (F): 99.6 Ambulatory Status: Ambulatory Pulse (bpm): 62 Discharge Destination: Home Respiratory Rate (breaths/min): 18 Transportation: Private Auto Blood Pressure (mmHg): 129/80 Accompanied By: self Schedule Follow-up Appointment: Yes Clinical Summary of Care: Electronic Signature(s) Signed: 05/04/2021 4:22:57 PM By: Dolan Amen RN Entered By: Dolan Amen on 05/04/2021 09:48:03 [place], Kameran L. ([id number]) -------------------------------------------------------------------------------- Lower Extremity Assessment Details Patient Name: Nicolas Cousin L. Date of Service: 05/04/2021 9:00 AM Medical Record Number: [id number] Patient Account Number: [id number] Date of Birth/Sex: Dec 23, 1970 (50 y.o. M) Treating RN: Dolan Amen Primary Care Narcissus Detwiler: PATIENT, NO Other Clinician: Referring Madia Carvell: Barkley Boards Treating Orton Capell/Extender: Skipper Cliche in Treatment: 16 Edema Assessment Assessed: [Left: No] [Right: Yes] Edema: [Left: N] [Right: o] Calf Left: Right: Point of Measurement: 40 cm From Medial Instep 35 cm Ankle Left: Right: Point of Measurement: 10 cm From Medial Instep 24.5 cm Vascular Assessment Pulses: Dorsalis Pedis Palpable: [Right:Yes] Electronic Signature(s) Signed: 05/04/2021 4:22:57 PM By: Dolan Amen RN Entered By: Dolan Amen on 05/04/2021 09:18:56 [place], [place] ([id number]) -------------------------------------------------------------------------------- Multi Wound Chart Details Patient Name: Nicolas Cousin  L. Date of Service: 05/04/2021 9:00 AM Medical Record Number: [id number] Patient Account Number: [id number] Date of Birth/Sex: 02/14/1971 (50 y.o. M) Treating RN: Dolan Amen Primary Care Nnamdi Dacus: PATIENT, NO Other Clinician: Referring Annalyn Blecher: Barkley Boards Treating Felecity Lemaster/Extender: Skipper Cliche in Treatment: 16 Vital Signs Height(in): 78 Pulse(bpm): 62 Weight(lbs): 249 Blood Pressure(mmHg): 129/80 Body Mass Index(BMI): 29 Temperature(F): 99.6 Respiratory Rate(breaths/min): 18 Photos: [N/A:N/A] Wound Location: Right, Medial Lower Leg N/A N/A Wounding Event: Gradually Appeared N/A N/A Primary Etiology: Venous Leg Ulcer N/A N/A Date Acquired: 11/24/2020 N/A N/A Weeks of Treatment: 16 N/A N/A Wound Status: Open N/A N/A Measurements L x W x D (cm) 0.6x0.7x0.1 N/A N/A Area (cm) : 0.33 N/A N/A Volume (cm) : 0.033 N/A N/A % Reduction in Area: 61.10% N/A N/A % Reduction in Volume: 80.60% N/A N/A Classification: Full Thickness Without Exposed N/A N/A Support Structures Exudate Amount: Medium N/A N/A Exudate Type: Serosanguineous N/A N/A Exudate Color: red, brown N/A N/A Wound Margin: Flat and Intact N/A N/A Granulation Amount: None Present (0%) N/A N/A Necrotic Amount: Large (67-100%) N/A N/A Exposed Structures: Fat Layer (Subcutaneous Tissue): N/A N/A Yes Fascia: No Tendon: No Muscle: No Joint: No Bone: No Epithelialization: Large (67-100%) N/A N/A Treatment Notes Electronic Signature(s) Signed: 05/04/2021 4:22:57 PM By: Dolan Amen RN Entered By: Dolan Amen on 05/04/2021 09:33:15 [name] ([id number]) -------------------------------------------------------------------------------- [place] Details Patient Name: HAYATO, GUAMAN L. Date of Service: 05/04/2021 9:00 AM Medical Record Number: [id number] Patient Account Number: [id number] Date of Birth/Sex: 1971/04/09 (50 y.o. M) Treating RN: Dolan Amen Primary Care Kailash Hinze:  PATIENT, NO Other Clinician: Referring Yazlin Ekblad: Barkley Boards Treating Danzig Macgregor/Extender: Skipper Cliche in Treatment: 16 Active Inactive Wound/Skin Impairment Nursing Diagnoses: Knowledge deficit related to ulceration/compromised skin integrity Goals: Patient/caregiver will verbalize understanding of skin care regimen Date Initiated: 01/12/2021 Date Inactivated: 02/23/2021 Target Resolution Date: 02/12/2021 Goal Status: Met Ulcer/skin breakdown will have a volume reduction of 30% by week 4 Date Initiated: 01/12/2021 Date Inactivated: 02/23/2021 Target Resolution Date: 02/12/2021 Goal Status: Met Ulcer/skin breakdown will have a volume reduction of 50% by week 8 Date Initiated: 01/12/2021 Date Inactivated: 03/16/2021 Target Resolution Date: 03/14/2021 Goal Status: Unmet Unmet Reason: comorbities Ulcer/skin breakdown will have a volume reduction of 80% by week 12 Date  Initiated: 01/12/2021 Date Inactivated: 04/27/2021 Target Resolution Date: 04/14/2021 Goal Status: Met Ulcer/skin breakdown will heal within 14 weeks Date Initiated: 01/12/2021 Target Resolution Date: 05/15/2021 Goal Status: Active Interventions: Assess patient/caregiver ability to obtain necessary supplies Assess patient/caregiver ability to perform ulcer/skin care regimen upon admission and as needed Assess ulceration(s) every visit Notes: Electronic Signature(s) Signed: 05/04/2021 4:22:57 PM By: Dolan Amen RN Entered By: Dolan Amen on 05/04/2021 09:20:09 [name] ([id number]) -------------------------------------------------------------------------------- Pain Assessment Details Patient Name: Nicolas Cousin L. Date of Service: 05/04/2021 9:00 AM Medical Record Number: [id number] Patient Account Number: [id number] Date of Birth/Sex: 06/06/1971 (49 y.o. M) Treating RN: Dolan Amen Primary Care Lowen Barringer: PATIENT, NO Other Clinician: Referring Sharae Zappulla: Barkley Boards Treating Saurabh Hettich/Extender: Skipper Cliche in Treatment: 16 Active Problems Location of Pain Severity and Description of Pain Patient Has Paino No Site Locations Rate the pain. Current Pain Level: 0 Pain Management and Medication Current Pain Management: Electronic Signature(s) Signed: 05/04/2021 4:22:57 PM By: Dolan Amen RN Entered By: Dolan Amen on 05/04/2021 09:14:37 [name] ([id number]) -------------------------------------------------------------------------------- Patient/Caregiver Education Details Patient Name: Nicolas Cousin L. Date of Service: 05/04/2021 9:00 AM Medical Record Number: [id number] Patient Account Number: [id number] Date of Birth/Gender: 18-Dec-1970 (50 y.o. M) Treating RN: Dolan Amen Primary Care Physician: PATIENT, NO Other Clinician: Referring Physician: Barkley Boards Treating Physician/Extender: Skipper Cliche in Treatment: 16 Education Assessment Education Provided To: Patient Education Topics Provided Wound Debridement: Methods: Explain/Verbal Responses: State content correctly Wound/Skin Impairment: Methods: Explain/Verbal Responses: State content correctly Electronic Signature(s) Signed: 05/04/2021 4:22:57 PM By: Dolan Amen RN Entered By: Dolan Amen on 05/04/2021 09:37:19 [place], [place] ([id number]) -------------------------------------------------------------------------------- Wound Assessment Details Patient Name: Nicolas Cousin L. Date of Service: 05/04/2021 9:00 AM Medical Record Number: [id number] Patient Account Number: [id number] Date of Birth/Sex: [birth year] (50 y.o. M) Treating RN: Dolan Amen Primary Care Loeta Herst: PATIENT, NO Other Clinician: Referring Eryca Bolte: Barkley Boards Treating Gaytha Raybourn/Extender: Skipper Cliche in Treatment: 16 Wound Status Wound Number: 1 Primary Etiology: Venous Leg Ulcer Wound Location: Right, Medial Lower Leg Wound Status: Open Wounding Event: Gradually Appeared Date Acquired: 11/24/2020 Weeks Of  Treatment: 16 Clustered Wound: No Photos Wound Measurements Length: (cm) 0.6 Width: (cm) 0.7 Depth: (cm) 0.1 Area: (cm) 0.33 Volume: (cm) 0.033 % Reduction in Area: 61.1% % Reduction in Volume: 80.6% Epithelialization: Large (67-100%) Tunneling: No Undermining: No Wound Description Classification: Full Thickness Without Exposed Support Structu Wound Margin: Flat and Intact Exudate Amount: Medium Exudate Type: Serosanguineous Exudate Color: red, brown res Foul Odor After Cleansing: No Slough/Fibrino Yes Wound Bed Granulation Amount: None Present (0%) Exposed Structure Necrotic Amount: Large (67-100%) Fascia Exposed: No Necrotic Quality: Adherent Slough Fat Layer (Subcutaneous Tissue) Exposed: Yes Tendon Exposed: No Muscle Exposed: No Joint Exposed: No Bone Exposed: No Treatment Notes Wound #1 (Lower Leg) Wound Laterality: Right, Medial Cleanser Soap and Water Discharge Instruction: Gently cleanse wound with antibacterial soap, rinse and pat dry prior to dressing wounds Peri-Wound Care TIMTOHY, BROSKI L. ([id number]) Topical Clobetasol Propionate ointment 0.05%, 60 (g) tube Discharge Instruction: Apply to wound bed and around wound Primary Dressing Hydrofera Blue Ready Transfer Foam, 2.5x2.5 (in/in) Discharge Instruction: Apply Hydrofera Blue Ready to wound bed as directed Secondary Dressing Bordered Gauze Sterile-HBD 4x4 (in/in) Discharge Instruction: Cover wound with Bordered Guaze Sterile as directed Secured With Compression Wrap Compression Stockings Add-Ons Electronic Signature(s) Signed: 05/04/2021 4:22:57 PM By: Dolan Amen RN Entered By: Dolan Amen on 05/04/2021 09:17:45 [place], [place] ([id number]) -------------------------------------------------------------------------------- Vitals  Details Patient Name: TYRON, MANETTA. Date of Service: 05/04/2021 9:00 AM Medical Record Number: [id number] Patient Account Number: [id number] Date of Birth/Sex:  May 27, 1971 (50 y.o. M) Treating RN: Dolan Amen Primary Care Jerran Tappan: PATIENT, NO Other Clinician: Referring Shirely Toren: Barkley Boards Treating Kiona Blume/Extender: Skipper Cliche in Treatment: 16 Vital Signs Time Taken: 09:13 Temperature (F): 99.6 Height (in): 78 Pulse (bpm): 62 Weight (lbs): 249 Respiratory Rate (breaths/min): 18 Body Mass Index (BMI): 28.8 Blood Pressure (mmHg): 129/80 Reference Range: 80 - 120 mg / dl Electronic Signature(s) Signed: 05/04/2021 4:22:57 PM By: Dolan Amen RN Entered By: Dolan Amen on 05/04/2021 09:14:31

## 2021-05-04 NOTE — Progress Notes (Addendum)
GAVRIL, RADICH ([id number]) Visit Report for 05/04/2021 Chief Complaint Document Details Patient Name: [name], [name]. Date of Service: 05/04/2021 9:00 AM Medical Record Number: [id number] Patient Account Number: [id number] Date of Birth/Sex: 26-Dec-1970 (51 y.o. M) Treating RN: Dolan Amen Primary Care Provider: PATIENT, NO Other Clinician: Referring Provider: Barkley Boards Treating Provider/Extender: Skipper Cliche in Treatment: 16 Information Obtained from: Patient Chief Complaint Right LE Ulcer Electronic Signature(s) Signed: 05/04/2021 9:23:56 AM By: Worthy Keeler PA-C Entered By: Worthy Keeler on 05/04/2021 09:23:56 [name], [name] ([id number]) -------------------------------------------------------------------------------- Debridement Details Patient Name: Nicolas Cousin L. Date of Service: 05/04/2021 9:00 AM Medical Record Number: [id number] Patient Account Number: [id number] Date of Birth/Sex: 1970-10-27 (50 y.o. M) Treating RN: Dolan Amen Primary Care Provider: PATIENT, NO Other Clinician: Referring Provider: Barkley Boards Treating Provider/Extender: Skipper Cliche in Treatment: 16 Debridement Performed for Wound #1 Right,Medial Lower Leg Assessment: Performed By: Physician Tommie Sams., PA-C Debridement Type: Debridement Severity of Tissue Pre Debridement: Fat layer exposed Level of Consciousness (Pre- Awake and Alert procedure): Pre-procedure Verification/Time Out Yes - 09:35 Taken: Start Time: 09:35 Total Area Debrided (L x W): 0.6 (cm) x 0.7 (cm) = 0.42 (cm) Tissue and other material Viable, Non-Viable, Slough, Subcutaneous, Biofilm, Slough debrided: Level: Skin/Subcutaneous Tissue Debridement Description: Excisional Instrument: Curette Bleeding: Minimum Hemostasis Achieved: Pressure Response to Treatment: Procedure was tolerated well Level of Consciousness (Post- Awake and Alert procedure): Post Debridement Measurements of Total  Wound Length: (cm) 0.6 Width: (cm) 0.7 Depth: (cm) 0.2 Volume: (cm) 0.066 Character of Wound/Ulcer Post Debridement: Stable Severity of Tissue Post Debridement: Fat layer exposed Post Procedure Diagnosis Same as Pre-procedure Electronic Signature(s) Signed: 05/04/2021 11:43:44 AM By: Worthy Keeler PA-C Signed: 05/04/2021 4:22:57 PM By: Dolan Amen RN Entered By: Dolan Amen on 05/04/2021 09:36:34 [place], [place] ([id number]) -------------------------------------------------------------------------------- HPI Details Patient Name: Nicolas Cousin L. Date of Service: 05/04/2021 9:00 AM Medical Record Number: [id number] Patient Account Number: [id number] Date of Birth/Sex: [birth year] (50 y.o. M) Treating RN: Dolan Amen Primary Care Provider: PATIENT, NO Other Clinician: Referring Provider: Barkley Boards Treating Provider/Extender: Skipper Cliche in Treatment: 16 History of Present Illness HPI Description: 01/12/2021 upon evaluation today patient appears to be doing somewhat poorly in regard to a wound on his right medial lower leg. Currently he tells me that this has been going on since around the beginning of April. He has done a telehealth visit with a physician who initially gave him a triamcinolone cream along with Keflex for 5 days. Subsequently he ended up being seen at walk-in urgent care where they also given Keflex for 7 days that seem to be doing better for him. Overall he tells me that things have improved from the standpoint of redness. Fortunately there does not appear to be any signs of systemic infection to be honest right now even locally I do not see any evidence of infection right now. He does have 1 main wound with some scattering areas that look like they may have been small ulcerations but again for the most part these have filled in. This does look almost to be more of a vasculitis type scenario based on what I am seeing. Patient does have chronic  venous insufficiency but is not wearing any compression even though it sounds like he has been told before he should. 5/27; patient with small wounds on his right medial ankle. He has chronic venous insufficiency and stasis dermatitis a large open wound and several smaller areas all of  which looks better than on presentation last week according to her nursing staff 01/26/2021 upon evaluation today patient appears to be doing well with regard to his wound. He is making good progress. With that being said there is some need currently for sharp debridement in regard to the wound there is some necrotic debris it is almost completely cleaned off but not completely. He is in agreement with that plan today. Fortunately there does not appear to be any evidence of active infection which is great and I am very pleased in that regard. He still wishes he did not have to wear the compression wrap. He did get his compression socks from [facility] therapy though they are in the 15 to 20 mmHg range they did not have any his length as he is a tall guy in the 20 to 30 mmHg range. 02/02/2021 upon evaluation today patient appears to be doing excellent in regard to his leg ulcer. He has been tolerating the dressing changes without complication. Fortunately there is no evidence of active infection at this time. No fevers, chills, nausea, vomiting, or diarrhea. 02/09/2021 upon evaluation today patient's wound actually showing signs of improvement. Fortunately there does not appear to be any evidence of active infection which is great news overall very pleased with where things stand today. 02/16/2021 upon evaluation today patient's wound is showing some signs of improvement. He did have a fairly aggressive debridement last week he notes that he had quite a bit of discomfort he was some swelling following. With that being said I think this was to be expected considering what we had to do from a debridement standpoint. The good news  is there does not appear to be any evidence of infection currently nonetheless I do believe that the patient may have some signs of vasculitis here. Based on the overall appearance of the wound today and beginning to suspect this. For that reason I think he would benefit from starting him on triamcinolone ointment to the wound bed and some of the immediate periwound to try to help out in this regard. 02/23/2021 upon evaluation today patient appears to be doing well with regard to his wound. He is actually making some progress here. The wound is roughly about the same size but the overall appearance of the wound bed is much improved. I do not see any signs of active infection at this time which is great news. No fevers, chills, nausea, vomiting, or diarrhea. 03/02/2021 upon evaluation today patient appears to be doing well with regard to his wound. I think the biggest issue I see right now is that with Hydrofera Blue this wound is staying somewhat dry. I think he could benefit from the use of collagen to try to help with getting this to stimulate some additional growth. He is in agreement with the plan. I think this also had a little bit more moisture which would be helpful at this point. 03/09/2021 on evaluation today patient appears to be doing about the same in regard to his wound is measuring a little bit smaller but still he does have the surface of the wound which is not quite as healthy as I would like to see. Fortunately there does not appear to be any active infection at this time. No fevers, chills, nausea, vomiting, or diarrhea. 03/16/2021 upon evaluation today patient appears to be doing better with regard to the overall appearance of his wound bed. There does not appear to be any signs of significant slough buildup I  think the Iodoflex has done very well for him. The patient's happy to hear this and see the improvement. With that being said he is continuing to have a little bit of expansion of  the wound again I think this is more related to #1 edema control and #2 the fact that again we will get the wound bed clean. I feel like were pretty much today as far as cleaning up the wound bed is concerned now we just need to make sure the edema is optimized. I am just not certain that his compression socks are doing the job. 03/23/2021 upon evaluation today patient unfortunately appears to be doing a little bit worse with regard to his wound I was actually expecting this to be much better this week. With that being said I am getting concerned about the possibility of pyoderma gangrenosum. I did print off some information for the patient in this regard. Subsequently I am also can see about starting him on some steroid cream as well as an oral steroid. 03/30/2021 upon evaluation today patient appears to be doing significantly better in regard to his wound. He has started using the clobetasol which seems to have made a excellent improvement in regard to the wound today. 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 Either way at this point I would recommend that we avoid sharp debridements I think that is probably can be the best option based on what I am seeing currently. The patient voiced understanding. He also had questions about taking the prednisone with regard to his immune system. I explained that I do believe that that can be an issue long-term but for short-term which is mainly what we are doing here I do not think it is going to be a major issue for him and in fact I think would help more especially considering the pyoderma here and the fact that is more of an autoimmune type issue than not using the prednisone. 04/06/2021 upon evaluation today patient's wound actually showed signs of excellent improvement. This definitely has  gone a lot better since we started with the topical steroids and overall I am extremely pleased with where things stand. No fevers, chills, nausea, vomiting, or diarrhea. With that being said the patient continues to feel much better and states that the pain is actually dramatically improved as well. This definitely appears [name], [name] ([id number]) to be a pyoderma situation. 04/13/2021 upon evaluation today patient appears to be doing well with regard to his wound. Fortunately there does not appear to be any signs of active infection at this time. No fevers, chills, nausea, vomiting, or diarrhea. With that being said I do believe that he is overall doing extremely well. This is measuring smaller and does seem to be headed in the right direction. 04/20/2021 upon evaluation today patient appears to be doing well with regard to his wound. He still has quite a bit of slough covering the surface of the wound. With that being said we previously debrided the wound he actually had a bit of worsening and therefore I am somewhat reluctant to proceed with any further debridement at this point. In fact this got quite a bit larger postdebridement. 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 Nonetheless I think it is can to take time to get  this to heal. 04/27/2021 upon evaluation today patient appears to be doing well with regard to his wound. This is measuring smaller this week yet again and overall I am extremely pleased with where we stand currently. Fortunately there does not appear to be any evidence of active infection which is great news. No fevers, chills, nausea, vomiting, or diarrhea. 05/04/2021 upon evaluation today patient's wound actually has a significant amount of biofilm noted on the surface of the wound. Fortunately there does not appear to be any evidence of active infection at this time which is great news and overall I am extremely pleased with where we stand today. However I do  think we may need to try to see about a sharp debridement to clear away some of the slough and biofilm on the surface of the wound to try to help this to heal more effectively and quickly. Patient voiced understanding. Electronic Signature(s) Signed: 05/04/2021 9:47:41 AM By: Worthy Keeler PA-C Entered By: Worthy Keeler on 05/04/2021 09:47:41 [name], [name] ([id number]) -------------------------------------------------------------------------------- Physical Exam Details Patient Name: ADHVIK, NANNA L. Date of Service: 05/04/2021 9:00 AM Medical Record Number: [id number] Patient Account Number: [id number] Date of Birth/Sex: 06/22/1971 (50 y.o. M) Treating RN: Dolan Amen Primary Care Provider: PATIENT, NO Other Clinician: Referring Provider: Barkley Boards Treating Provider/Extender: Skipper Cliche in Treatment: 22 Constitutional Well-nourished and well-hydrated in no acute distress. Respiratory normal breathing without difficulty. Psychiatric this patient is able to make decisions and demonstrates good insight into disease process. Alert and Oriented x 3. pleasant and cooperative. Notes Upon inspection patient's wound bed showed signs of good granulation epithelization at this point. Fortunately there does not appear to be any evidence of active infection systemically which is great news and overall I am extremely pleased with where we stand today. Sharp debridement was performed to clear away some of the slough and biofilm on the surface of the wound he tolerated that today without complication postdebridement wound bed appears to be doing much better. Electronic Signature(s) Signed: 05/04/2021 9:48:04 AM By: Worthy Keeler PA-C Entered By: Worthy Keeler on 05/04/2021 09:48:04 [name] ([id number]) -------------------------------------------------------------------------------- Physician Orders Details Patient Name: [name], [name] Date of Service: 05/04/2021 9:00  AM Medical Record Number: [id number] Patient Account Number: [id number] Date of Birth/Sex: 1971/06/08 (50 y.o. M) Treating RN: Dolan Amen Primary Care Provider: PATIENT, NO Other Clinician: Referring Provider: Barkley Boards Treating Provider/Extender: Skipper Cliche in Treatment: 16 Verbal / Phone Orders: No Diagnosis Coding ICD-10 Coding Code Description I87.2 Venous insufficiency (chronic) (peripheral) L97.812 Non-pressure chronic ulcer of other part of right lower leg with fat layer exposed Follow-up Appointments o Return Appointment in 1 week. Bathing/ Shower/ Hygiene o May shower; gently cleanse wound with antibacterial soap, rinse and pat dry prior to dressing wounds Edema Control - Lymphedema / Segmental Compressive Device / Other Bilateral Lower Extremities o Patient to wear own compression stockings. Remove compression stockings every night before going to bed and put on every morning when getting up. o Elevate, Exercise Daily and Avoid Standing for Long Periods of Time. o Elevate legs to the level of the heart and pump ankles as often as possible o Elevate leg(s) parallel to the floor when sitting. Wound Treatment Wound #1 - Lower Leg Wound Laterality: Right, Medial Cleanser: Soap and Water 1 x Per Day/30 Days Discharge Instructions: Gently cleanse wound with antibacterial soap, rinse and pat dry prior to dressing wounds Topical: Clobetasol Propionate ointment 0.05%, 60 (g) tube 1 x  Per Day/30 Days Discharge Instructions: Apply to wound bed and around wound Primary Dressing: Hydrofera Blue Ready Transfer Foam, 2.5x2.5 (in/in) 1 x Per Day/30 Days Discharge Instructions: Apply Hydrofera Blue Ready to wound bed as directed Secondary Dressing: Bordered Gauze Sterile-HBD 4x4 (in/in) 1 x Per Day/30 Days Discharge Instructions: Cover wound with Bordered Guaze Sterile as directed Electronic Signature(s) Signed: 05/04/2021 11:43:44 AM By: Worthy Keeler  PA-C Signed: 05/04/2021 4:22:57 PM By: Dolan Amen RN Entered By: Dolan Amen on 05/04/2021 09:36:58 [place], [place] ([id number]) -------------------------------------------------------------------------------- Problem List Details Patient Name: KASHON, JARRET L. Date of Service: 05/04/2021 9:00 AM Medical Record Number: [id number] Patient Account Number: [id number] Date of Birth/Sex: [birth year] (50 y.o. M) Treating RN: Dolan Amen Primary Care Provider: PATIENT, NO Other Clinician: Referring Provider: Barkley Boards Treating Provider/Extender: Skipper Cliche in Treatment: 16 Active Problems ICD-10 Encounter Code Description Active Date MDM Diagnosis I87.2 Venous insufficiency (chronic) (peripheral) 01/12/2021 No Yes L97.812 Non-pressure chronic ulcer of other part of right lower leg with fat layer 01/12/2021 No Yes exposed Inactive Problems Resolved Problems Electronic Signature(s) Signed: 05/04/2021 9:23:52 AM By: Worthy Keeler PA-C Entered By: Worthy Keeler on 05/04/2021 09:23:51 [name], [name] ([id number]) -------------------------------------------------------------------------------- Progress Note Details Patient Name: Nicolas Cousin L. Date of Service: 05/04/2021 9:00 AM Medical Record Number: [id number] Patient Account Number: [id number] Date of Birth/Sex: February 07, 1971 (50 y.o. M) Treating RN: Dolan Amen Primary Care Provider: PATIENT, NO Other Clinician: Referring Provider: Barkley Boards Treating Provider/Extender: Skipper Cliche in Treatment: 16 Subjective Chief Complaint Information obtained from Patient Right LE Ulcer History of Present Illness (HPI) 01/12/2021 upon evaluation today patient appears to be doing somewhat poorly in regard to a wound on his right medial lower leg. Currently he tells me that this has been going on since around the beginning of April. He has done a telehealth visit with a physician who initially gave him a triamcinolone cream  along with Keflex for 5 days. Subsequently he ended up being seen at walk-in urgent care where they also given Keflex for 7 days that seem to be doing better for him. Overall he tells me that things have improved from the standpoint of redness. Fortunately there does not appear to be any signs of systemic infection to be honest right now even locally I do not see any evidence of infection right now. He does have 1 main wound with some scattering areas that look like they may have been small ulcerations but again for the most part these have filled in. This does look almost to be more of a vasculitis type scenario based on what I am seeing. Patient does have chronic venous insufficiency but is not wearing any compression even though it sounds like he has been told before he should. 5/27; patient with small wounds on his right medial ankle. He has chronic venous insufficiency and stasis dermatitis a large open wound and several smaller areas all of which looks better than on presentation last week according to her nursing staff 01/26/2021 upon evaluation today patient appears to be doing well with regard to his wound. He is making good progress. With that being said there is some need currently for sharp debridement in regard to the wound there is some necrotic debris it is almost completely cleaned off but not completely. He is in agreement with that plan today. Fortunately there does not appear to be any evidence of active infection which is great and I am very pleased in that regard. He  still wishes he did not have to wear the compression wrap. He did get his compression socks from elastic therapy though they are in the 15 to 20 mmHg range they did not have any his length as he is a tall guy in the 20 to 30 mmHg range. 02/02/2021 upon evaluation today patient appears to be doing excellent in regard to his leg ulcer. He has been tolerating the dressing changes without complication. Fortunately there is no  evidence of active infection at this time. No fevers, chills, nausea, vomiting, or diarrhea. 02/09/2021 upon evaluation today patient's wound actually showing signs of improvement. Fortunately there does not appear to be any evidence of active infection which is great news overall very pleased with where things stand today. 02/16/2021 upon evaluation today patient's wound is showing some signs of improvement. He did have a fairly aggressive debridement last week he notes that he had quite a bit of discomfort he was some swelling following. With that being said I think this was to be expected considering what we had to do from a debridement standpoint. The good news is there does not appear to be any evidence of infection currently nonetheless I do believe that the patient may have some signs of vasculitis here. Based on the overall appearance of the wound today and beginning to suspect this. For that reason I think he would benefit from starting him on triamcinolone ointment to the wound bed and some of the immediate periwound to try to help out in this regard. 02/23/2021 upon evaluation today patient appears to be doing well with regard to his wound. He is actually making some progress here. The wound is roughly about the same size but the overall appearance of the wound bed is much improved. I do not see any signs of active infection at this time which is great news. No fevers, chills, nausea, vomiting, or diarrhea. 03/02/2021 upon evaluation today patient appears to be doing well with regard to his wound. I think the biggest issue I see right now is that with Hydrofera Blue this wound is staying somewhat dry. I think he could benefit from the use of collagen to try to help with getting this to stimulate some additional growth. He is in agreement with the plan. I think this also had a little bit more moisture which would be helpful at this point. 03/09/2021 on evaluation today patient appears to be doing  about the same in regard to his wound is measuring a little bit smaller but still he does have the surface of the wound which is not quite as healthy as I would like to see. Fortunately there does not appear to be any active infection at this time. No fevers, chills, nausea, vomiting, or diarrhea. 03/16/2021 upon evaluation today patient appears to be doing better with regard to the overall appearance of his wound bed. There does not appear to be any signs of significant slough buildup I think the Iodoflex has done very well for him. The patient's happy to hear this and see the improvement. With that being said he is continuing to have a little bit of expansion of the wound again I think this is more related to #1 edema control and #2 the fact that again we will get the wound bed clean. I feel like were pretty much today as far as cleaning up the wound bed is concerned now we just need to make sure the edema is optimized. I am just not certain that his  compression socks are doing the job. 03/23/2021 upon evaluation today patient unfortunately appears to be doing a little bit worse with regard to his wound I was actually expecting this to be much better this week. With that being said I am getting concerned about the possibility of pyoderma gangrenosum. I did print off some information for the patient in this regard. Subsequently I am also can see about starting him on some steroid cream as well as an oral steroid. 03/30/2021 upon evaluation today patient appears to be doing significantly better in regard to his wound. He has started using the clobetasol which seems to have made a excellent improvement in regard to the wound today. 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 Either way at this point I would recommend that we avoid sharp  debridements I think that is probably can be the best option based on what I am seeing currently. The patient voiced understanding. He also had questions about taking the prednisone with regard to his immune system. I explained that I do believe that that can be an issue long-term but for short-term which is mainly what we are doing here I do not think it is going to be a major issue for him and in fact I think would help more especially considering the pyoderma here and the fact that is more of an [name], [name] ([id number]) autoimmune type issue than not using the prednisone. 04/06/2021 upon evaluation today patient's wound actually showed signs of excellent improvement. This definitely has gone a lot better since we started with the topical steroids and overall I am extremely pleased with where things stand. No fevers, chills, nausea, vomiting, or diarrhea. With that being said the patient continues to feel much better and states that the pain is actually dramatically improved as well. 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 04/13/2021 upon evaluation today patient appears to be doing well with regard to his wound. Fortunately there does not appear to be any signs of active infection at this time. No fevers, chills, nausea, vomiting, or diarrhea. With that being said I do believe that he is overall doing extremely well. This is measuring smaller and does seem to be headed in the right direction. 04/20/2021 upon evaluation today patient appears to be doing well with regard to his wound. He still has quite a bit of slough covering the surface of the wound. With that being said we previously debrided the wound he actually had a bit of worsening and therefore I am somewhat reluctant to proceed with any further debridement at this point. In fact this got quite a bit larger postdebridement. 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 Nonetheless I think it is can to take time to get  this to heal. 04/27/2021 upon evaluation today patient appears to be doing well with regard to his wound. This is measuring smaller this week yet again and overall I am extremely pleased with where we stand currently. Fortunately there does not appear to be any evidence of active infection which is great news. No fevers, chills, nausea, vomiting, or diarrhea. 05/04/2021 upon evaluation today patient's wound actually has a significant amount of biofilm noted on the surface of the wound. Fortunately there does not appear to be any evidence of active infection at this time  which is great news and overall I am extremely pleased with where we stand today. However I do think we may need to try to see about a sharp debridement to clear away some of the slough and biofilm on the surface of the wound to try to help this to heal more effectively and quickly. Patient voiced understanding. Objective Constitutional Well-nourished and well-hydrated in no acute distress. Vitals Time Taken: 9:13 AM, Height: 78 in, Weight: 249 lbs, BMI: 28.8, Temperature: 99.6 F, Pulse: 62 bpm, Respiratory Rate: 18 breaths/min, Blood Pressure: 129/80 mmHg. Respiratory normal breathing without difficulty. Psychiatric this patient is able to make decisions and demonstrates good insight into disease process. Alert and Oriented x 3. pleasant and cooperative. General Notes: Upon inspection patient's wound bed showed signs of good granulation epithelization at this point. Fortunately there does not appear to be any evidence of active infection systemically which is great news and overall I am extremely pleased with where we stand today. Sharp debridement was performed to clear away some of the slough and biofilm on the surface of the wound he tolerated that today without complication postdebridement wound bed appears to be doing much better. Integumentary (Hair, Skin) Wound #1 status is Open. Original cause of wound was Gradually  Appeared. The date acquired was: 11/24/2020. The wound has been in treatment 16 weeks. The wound is located on the Right,Medial Lower Leg. The wound measures 0.6cm length x 0.7cm width x 0.1cm depth; 0.33cm^2 area and 0.033cm^3 volume. There is Fat Layer (Subcutaneous Tissue) exposed. There is no tunneling or undermining noted. There is a medium amount of serosanguineous drainage noted. The wound margin is flat and intact. There is no granulation within the wound bed. There is a large (67-100%) amount of necrotic tissue within the wound bed including Adherent Slough. Assessment Active Problems ICD-10 Venous insufficiency (chronic) (peripheral) Non-pressure chronic ulcer of other part of right lower leg with fat layer exposed Correll, [name]. ([id number]) Procedures Wound #1 Pre-procedure diagnosis of Wound #1 is a Venous Leg Ulcer located on the Right,Medial Lower Leg .Severity of Tissue Pre Debridement is: Fat layer exposed. There was a Excisional Skin/Subcutaneous Tissue Debridement with a total area of 0.42 sq cm performed by Tommie Sams., PA-C. With the following instrument(s): Curette to remove Viable and Non-Viable tissue/material. Material removed includes Subcutaneous Tissue, Slough, and Biofilm. A time out was conducted at 09:35, prior to the start of the procedure. A Minimum amount of bleeding was controlled with Pressure. The procedure was tolerated well. Post Debridement Measurements: 0.6cm length x 0.7cm width x 0.2cm depth; 0.066cm^3 volume. Character of Wound/Ulcer Post Debridement is stable. Severity of Tissue Post Debridement is: Fat layer exposed. Post procedure Diagnosis Wound #1: Same as Pre-Procedure Plan Follow-up Appointments: Return Appointment in 1 week. Bathing/ Shower/ Hygiene: May shower; gently cleanse wound with antibacterial soap, rinse and pat dry prior to dressing wounds Edema Control - Lymphedema / Segmental Compressive Device / Other: Patient to wear own  compression stockings. Remove compression stockings every night before going to bed and put on every morning when getting up. Elevate, Exercise Daily and Avoid Standing for Long Periods of Time. Elevate legs to the level of the heart and pump ankles as often as possible Elevate leg(s) parallel to the floor when sitting. WOUND #1: - Lower Leg Wound Laterality: Right, Medial Cleanser: Soap and Water 1 x Per Day/30 Days Discharge Instructions: Gently cleanse wound with antibacterial soap, rinse and pat dry prior to dressing wounds Topical: Clobetasol Propionate  ointment 0.05%, 60 (g) tube 1 x Per Day/30 Days Discharge Instructions: Apply to wound bed and around wound Primary Dressing: Hydrofera Blue Ready Transfer Foam, 2.5x2.5 (in/in) 1 x Per Day/30 Days Discharge Instructions: Apply Hydrofera Blue Ready to wound bed as directed Secondary Dressing: Bordered Gauze Sterile-HBD 4x4 (in/in) 1 x Per Day/30 Days Discharge Instructions: Cover wound with Bordered Guaze Sterile as directed 1. Would recommend currently that we going to continue with wound care measures as before with regard to the clobetasol followed by the [facility] which I think is doing a good job. 2. I am also can recommend that we continue to cover with a border foam dressing. We will see patient back for reevaluation in 1 week here in the clinic. If anything worsens or changes patient will contact our office for additional recommendations. Electronic Signature(s) Signed: 05/04/2021 9:48:23 AM By: Worthy Keeler PA-C Entered By: Worthy Keeler on 05/04/2021 09:48:23 [name], [name] ([id number]) -------------------------------------------------------------------------------- SuperBill Details Patient Name: Nicolas Cousin L. Date of Service: 05/04/2021 Medical Record Number: [id number] Patient Account Number: [id number] Date of Birth/Sex: [birth year] (50 y.o. M) Treating RN: Dolan Amen Primary Care Provider: PATIENT, NO  Other Clinician: Referring Provider: Barkley Boards Treating Provider/Extender: Skipper Cliche in Treatment: 16 Diagnosis Coding ICD-10 Codes Code Description I87.2 Venous insufficiency (chronic) (peripheral) L97.812 Non-pressure chronic ulcer of other part of right lower leg with fat layer exposed Facility Procedures CPT4 Code: [id number] Description: [id number] - DEB SUBQ TISSUE 20 SQ CM/< Modifier: Quantity: 1 CPT4 Code: Description: ICD-10 Diagnosis Description [id number] Non-pressure chronic ulcer of other part of right lower leg with fat lay Modifier: er exposed Quantity: Physician Procedures CPT4 Code: [id number] Description: [place] - WC PHYS SUBQ TISS 20 SQ CM Modifier: Quantity: 1 CPT4 Code: Description: ICD-10 Diagnosis Description [id number] Non-pressure chronic ulcer of other part of right lower leg with fat lay Modifier: er exposed Quantity: Electronic Signature(s) Signed: 05/04/2021 9:48:40 AM By: Worthy Keeler PA-C Entered By: Worthy Keeler on 05/04/2021 09:48:39

## 2021-05-11 ENCOUNTER — Encounter: Payer: Self-pay | Admitting: Physician Assistant

## 2021-05-11 ENCOUNTER — Other Ambulatory Visit: Payer: Self-pay

## 2021-05-11 NOTE — Progress Notes (Signed)
JUQUAN, REZNICK ([id number]) Visit Report for 05/11/2021 Arrival Information Details Patient Name: [name], [name]. Date of Service: 05/11/2021 9:00 AM Medical Record Number: [id number] Patient Account Number: [id number] Date of Birth/Sex: 11-Apr-1971 (50 y.o. M) Treating RN: Dolan Amen Primary Care Blayze Haen: PATIENT, NO Other Clinician: Referring Karman Veney: Barkley Boards Treating Abhimanyu Cruces/Extender: Skipper Cliche in Treatment: 47 Visit Information History Since Last Visit Pain Present Now: No Patient Arrived: Ambulatory Arrival Time: 09:18 Accompanied By: self Transfer Assistance: None Patient Identification Verified: Yes Secondary Verification Process Completed: Yes Patient Requires Transmission-Based Precautions: No Patient Has Alerts: Yes Patient Alerts: NOT DIABETIC Electronic Signature(s) Signed: 05/11/2021 4:40:04 PM By: Dolan Amen RN Entered By: Dolan Amen on 05/11/2021 09:21:41 [place] ([id number]) -------------------------------------------------------------------------------- Clinic Level of Care Assessment Details Patient Name: Nicolas Cousin L. Date of Service: 05/11/2021 9:00 AM Medical Record Number: [id number] Patient Account Number: [id number] Date of Birth/Sex: 1971/07/10 (50 y.o. M) Treating RN: Dolan Amen Primary Care Lasheba Stevens: PATIENT, NO Other Clinician: Referring Aviyon Hocevar: Barkley Boards Treating Benjamim Harnish/Extender: Skipper Cliche in Treatment: 17 Clinic Level of Care Assessment Items TOOL 4 Quantity Score X - Use when only an EandM is performed on FOLLOW-UP visit 1 0 ASSESSMENTS - Nursing Assessment / Reassessment X - Reassessment of Co-morbidities (includes updates in patient status) 1 10 X- 1 5 Reassessment of Adherence to Treatment Plan ASSESSMENTS - Wound and Skin Assessment / Reassessment X - Simple Wound Assessment / Reassessment - one wound 1 5 []  - 0 Complex Wound Assessment / Reassessment - multiple wounds []  -  0 Dermatologic / Skin Assessment (not related to wound area) ASSESSMENTS - Focused Assessment []  - Circumferential Edema Measurements - multi extremities 0 []  - 0 Nutritional Assessment / Counseling / Intervention []  - 0 Lower Extremity Assessment (monofilament, tuning fork, pulses) []  - 0 Peripheral Arterial Disease Assessment (using hand held doppler) ASSESSMENTS - Ostomy and/or Continence Assessment and Care []  - Incontinence Assessment and Management 0 []  - 0 Ostomy Care Assessment and Management (repouching, etc.) PROCESS - Coordination of Care X - Simple Patient / Family Education for ongoing care 1 15 []  - 0 Complex (extensive) Patient / Family Education for ongoing care []  - 0 Staff obtains Programmer, systems, Records, Test Results / Process Orders []  - 0 Staff telephones HHA, Nursing Homes / Clarify orders / etc []  - 0 Routine Transfer to another Facility (non-emergent condition) []  - 0 Routine Hospital Admission (non-emergent condition) []  - 0 New Admissions / Biomedical engineer / Ordering NPWT, Apligraf, etc. []  - 0 Emergency Hospital Admission (emergent condition) X- 1 10 Simple Discharge Coordination []  - 0 Complex (extensive) Discharge Coordination PROCESS - Special Needs []  - Pediatric / Minor Patient Management 0 []  - 0 Isolation Patient Management []  - 0 Hearing / Language / Visual special needs []  - 0 Assessment of Community assistance (transportation, D/C planning, etc.) []  - 0 Additional assistance / Altered mentation []  - 0 Support Surface(s) Assessment (bed, cushion, seat, etc.) INTERVENTIONS - Wound Cleansing / Measurement Hlavac, Usbaldo L. ([id number]) X- 1 5 Simple Wound Cleansing - one wound []  - 0 Complex Wound Cleansing - multiple wounds X- 1 5 Wound Imaging (photographs - any number of wounds) []  - 0 Wound Tracing (instead of photographs) X- 1 5 Simple Wound Measurement - one wound []  - 0 Complex Wound Measurement - multiple  wounds INTERVENTIONS - Wound Dressings []  - Small Wound Dressing one or multiple wounds 0 X- 1 15 Medium Wound Dressing one or multiple wounds []  -  0 Large Wound Dressing one or multiple wounds []  - 0 Application of Medications - topical []  - 0 Application of Medications - injection INTERVENTIONS - Miscellaneous []  - External ear exam 0 []  - 0 Specimen Collection (cultures, biopsies, blood, body fluids, etc.) []  - 0 Specimen(s) / Culture(s) sent or taken to Lab for analysis []  - 0 Patient Transfer (multiple staff / Civil Service fast streamer / Similar devices) []  - 0 Simple Staple / Suture removal (25 or less) []  - 0 Complex Staple / Suture removal (26 or more) []  - 0 Hypo / Hyperglycemic Management (close monitor of Blood Glucose) []  - 0 Ankle / Brachial Index (ABI) - do not check if billed separately X- 1 5 Vital Signs Has the patient been seen at the hospital within the last three years: Yes Total Score: 80 Level Of Care: New/Established - Level 3 Electronic Signature(s) Signed: 05/11/2021 4:40:04 PM By: Dolan Amen RN Entered By: Dolan Amen on 05/11/2021 09:54:04 Deats, Davone L. ([id number]) -------------------------------------------------------------------------------- Encounter Discharge Information Details Patient Name: Nicolas Cousin L. Date of Service: 05/11/2021 9:00 AM Medical Record Number: [id number] Patient Account Number: [id number] Date of Birth/Sex: 15-Jun-1971 (50 y.o. M) Treating RN: Dolan Amen Primary Care Areya Lemmerman: PATIENT, NO Other Clinician: Referring Jamile Rekowski: Barkley Boards Treating [name]/Extender: Skipper Cliche in Treatment: 17 Encounter Discharge Information Items Discharge Condition: Stable Ambulatory Status: Ambulatory Discharge Destination: Home Transportation: Private Auto Accompanied By: self Schedule Follow-up Appointment: Yes Clinical Summary of Care: Electronic Signature(s) Signed: 05/11/2021 10:23:23 AM By: Dolan Amen  RN Entered By: Dolan Amen on 05/11/2021 10:23:23 [name] ([id number]) -------------------------------------------------------------------------------- Lower Extremity Assessment Details Patient Name: [name], [name] Date of Service: 05/11/2021 9:00 AM Medical Record Number: [id number] Patient Account Number: [id number] Date of Birth/Sex: 1971-02-18 (50 y.o. M) Treating RN: Dolan Amen Primary Care Jovann Luse: PATIENT, NO Other Clinician: Referring Inioluwa Boulay: Barkley Boards Treating [place] Latulippe/Extender: Skipper Cliche in Treatment: 17 Edema Assessment Assessed: [Left: No] [Right: Yes] Edema: [Left: Ye] [Right: s] Calf Left: Right: Point of Measurement: 40 cm From Medial Instep 35.5 cm Ankle Left: Right: Point of Measurement: 10 cm From Medial Instep 24.5 cm Vascular Assessment Pulses: Dorsalis Pedis Palpable: [Right:Yes] Electronic Signature(s) Signed: 05/11/2021 4:40:04 PM By: Dolan Amen RN Entered By: Dolan Amen on 05/11/2021 09:30:44 [place], [place] ([id number]) -------------------------------------------------------------------------------- Multi Wound Chart Details Patient Name: Nicolas Cousin L. Date of Service: 05/11/2021 9:00 AM Medical Record Number: [id number] Patient Account Number: [id number] Date of Birth/Sex: 1971/06/19 (50 y.o. M) Treating RN: Dolan Amen Primary Care Avier Jech: PATIENT, NO Other Clinician: Referring Jacorion Klem: Barkley Boards Treating Zyairah Wacha/Extender: Skipper Cliche in Treatment: 17 Vital Signs Height(in): 78 Pulse(bpm): 20 Weight(lbs): 249 Blood Pressure(mmHg): 143/77 Body Mass Index(BMI): 29 Temperature(F): 98.7 Respiratory Rate(breaths/min): 18 Photos: [N/A:N/A] Wound Location: Right, Medial Lower Leg N/A N/A Wounding Event: Gradually Appeared N/A N/A Primary Etiology: Venous Leg Ulcer N/A N/A Date Acquired: 11/24/2020 N/A N/A Weeks of Treatment: 17 N/A N/A Wound Status: Open N/A N/A Measurements L x W x D  (cm) 0.8x1x0.1 N/A N/A Area (cm) : 0.628 N/A N/A Volume (cm) : 0.063 N/A N/A % Reduction in Area: 25.90% N/A N/A % Reduction in Volume: 62.90% N/A N/A Classification: Full Thickness Without Exposed N/A N/A Support Structures Exudate Amount: Medium N/A N/A Exudate Type: Serosanguineous N/A N/A Exudate Color: red, brown N/A N/A Wound Margin: Flat and Intact N/A N/A Granulation Amount: Medium (34-66%) N/A N/A Granulation Quality: Pink N/A N/A Necrotic Amount: Medium (34-66%) N/A N/A Exposed Structures: Fat  Layer (Subcutaneous Tissue): N/A N/A Yes Fascia: No Tendon: No Muscle: No Joint: No Bone: No Epithelialization: Large (67-100%) N/A N/A Treatment Notes Electronic Signature(s) Signed: 05/11/2021 4:40:04 PM By: Dolan Amen RN Entered By: Dolan Amen on 05/11/2021 09:52:07 [name] ([id number]) -------------------------------------------------------------------------------- [place] Details Patient Name: [name], [name] Date of Service: 05/11/2021 9:00 AM Medical Record Number: [id number] Patient Account Number: [id number] Date of Birth/Sex: 04-05-1971 (50 y.o. M) Treating RN: Dolan Amen Primary Care Delitha Elms: PATIENT, NO Other Clinician: Referring Mariona Scholes: Barkley Boards Treating Elisavet Buehrer/Extender: Skipper Cliche in Treatment: 17 Active Inactive Wound/Skin Impairment Nursing Diagnoses: Knowledge deficit related to ulceration/compromised skin integrity Goals: Patient/caregiver will verbalize understanding of skin care regimen Date Initiated: 01/12/2021 Date Inactivated: 02/23/2021 Target Resolution Date: 02/12/2021 Goal Status: Met Ulcer/skin breakdown will have a volume reduction of 30% by week 4 Date Initiated: 01/12/2021 Date Inactivated: 02/23/2021 Target Resolution Date: 02/12/2021 Goal Status: Met Ulcer/skin breakdown will have a volume reduction of 50% by week 8 Date Initiated: 01/12/2021 Date Inactivated: 03/16/2021 Target  Resolution Date: 03/14/2021 Goal Status: Unmet Unmet Reason: comorbities Ulcer/skin breakdown will have a volume reduction of 80% by week 12 Date Initiated: 01/12/2021 Date Inactivated: 04/27/2021 Target Resolution Date: 04/14/2021 Goal Status: Met Ulcer/skin breakdown will heal within 14 weeks Date Initiated: 01/12/2021 Target Resolution Date: 05/15/2021 Goal Status: Active Interventions: Assess patient/caregiver ability to obtain necessary supplies Assess patient/caregiver ability to perform ulcer/skin care regimen upon admission and as needed Assess ulceration(s) every visit Notes: Electronic Signature(s) Signed: 05/11/2021 4:40:04 PM By: Dolan Amen RN Entered By: Dolan Amen on 05/11/2021 09:51:57 [place] ([id number]) -------------------------------------------------------------------------------- Pain Assessment Details Patient Name: Nicolas Cousin L. Date of Service: 05/11/2021 9:00 AM Medical Record Number: [id number] Patient Account Number: [id number] Date of Birth/Sex: 1971-05-01 (50 y.o. M) Treating RN: Dolan Amen Primary Care Hill Mackie: PATIENT, NO Other Clinician: Referring Abdalrahman Clementson: Barkley Boards Treating Neng Albee/Extender: Skipper Cliche in Treatment: 17 Active Problems Location of Pain Severity and Description of Pain Patient Has Paino No Site Locations Rate the pain. Current Pain Level: 0 Pain Management and Medication Current Pain Management: Electronic Signature(s) Signed: 05/11/2021 4:40:04 PM By: Dolan Amen RN Entered By: Dolan Amen on 05/11/2021 09:23:52 [name] ([id number]) -------------------------------------------------------------------------------- Patient/Caregiver Education Details Patient Name: Nicolas Cousin L. Date of Service: 05/11/2021 9:00 AM Medical Record Number: [id number] Patient Account Number: [id number] Date of Birth/Gender: [birth year] (50 y.o. M) Treating RN: Dolan Amen Primary Care Physician:  PATIENT, NO Other Clinician: Referring Physician: Barkley Boards Treating Physician/Extender: Skipper Cliche in Treatment: 55 Education Assessment Education Provided To: Patient Education Topics Provided Wound/Skin Impairment: Methods: Explain/Verbal Responses: State content correctly Electronic Signature(s) Signed: 05/11/2021 4:40:04 PM By: Dolan Amen RN Entered By: Dolan Amen on 05/11/2021 09:54:21 [name], [name] ([id number]) -------------------------------------------------------------------------------- Wound Assessment Details Patient Name: [name], [name] Date of Service: 05/11/2021 9:00 AM Medical Record Number: [id number] Patient Account Number: [id number] Date of Birth/Sex: 12-09-1970 (50 y.o. M) Treating RN: Dolan Amen Primary Care Lanise Mergen: PATIENT, NO Other Clinician: Referring Justen Fonda: Barkley Boards Treating Janean Eischen/Extender: Skipper Cliche in Treatment: 17 Wound Status Wound Number: 1 Primary Etiology: Venous Leg Ulcer Wound Location: Right, Medial Lower Leg Wound Status: Open Wounding Event: Gradually Appeared Date Acquired: 11/24/2020 Weeks Of Treatment: 17 Clustered Wound: No Photos Wound Measurements Length: (cm) 0.8 Width: (cm) 1 Depth: (cm) 0.1 Area: (cm) 0.628 Volume: (cm) 0.063 % Reduction in Area: 25.9% % Reduction in Volume: 62.9% Epithelialization: Large (67-100%) Tunneling: No Undermining:  No Wound Description Classification: Full Thickness Without Exposed Support Structu Wound Margin: Flat and Intact Exudate Amount: Medium Exudate Type: Serosanguineous Exudate Color: red, brown res Foul Odor After Cleansing: No Slough/Fibrino Yes Wound Bed Granulation Amount: Medium (34-66%) Exposed Structure Granulation Quality: Pink Fascia Exposed: No Necrotic Amount: Medium (34-66%) Fat Layer (Subcutaneous Tissue) Exposed: Yes Necrotic Quality: Adherent Slough Tendon Exposed: No Muscle Exposed: No Joint Exposed: No Bone  Exposed: No Treatment Notes Wound #1 (Lower Leg) Wound Laterality: Right, Medial Cleanser Soap and Water Discharge Instruction: Gently cleanse wound with antibacterial soap, rinse and pat dry prior to dressing wounds Peri-Wound Care JOYCE, [name] ([id number]) Topical Clobetasol Propionate ointment 0.05%, 60 (g) tube Discharge Instruction: Apply to wound bed and around wound Primary Dressing Hydrofera Blue Ready Transfer Foam, 2.5x2.5 (in/in) Discharge Instruction: Apply Hydrofera Blue Ready to wound bed as directed Secondary Dressing Bordered Gauze Sterile-HBD 4x4 (in/in) Discharge Instruction: Cover wound with Bordered Guaze Sterile as directed Secured With Compression Wrap Compression Stockings Add-Ons Electronic Signature(s) Signed: 05/11/2021 4:40:04 PM By: Dolan Amen RN Entered By: Dolan Amen on 05/11/2021 09:29:25 [name] ([id number]) -------------------------------------------------------------------------------- Vitals Details Patient Name: Nicolas Cousin L. Date of Service: 05/11/2021 9:00 AM Medical Record Number: [id number] Patient Account Number: [id number] Date of Birth/Sex: 12/08/1970 (50 y.o. M) Treating RN: Dolan Amen Primary Care Dow Blahnik: PATIENT, NO Other Clinician: Referring Sarp Vernier: Barkley Boards Treating Bobbye Petti/Extender: Skipper Cliche in Treatment: 17 Vital Signs Time Taken: 09:21 Temperature (F): 98.7 Height (in): 78 Pulse (bpm): 76 Weight (lbs): 249 Respiratory Rate (breaths/min): 18 Body Mass Index (BMI): 28.8 Blood Pressure (mmHg): 143/77 Reference Range: 80 - 120 mg / dl Electronic Signature(s) Signed: 05/11/2021 4:40:04 PM By: Dolan Amen RN Entered By: Dolan Amen on 05/11/2021 09:23:35

## 2021-05-11 NOTE — Progress Notes (Addendum)
RAI, KALAL ([id number]) Visit Report for 05/11/2021 Chief Complaint Document Details Patient Name: [name], [name]. Date of Service: 05/11/2021 9:00 AM Medical Record Number: [id number] Patient Account Number: [id number] Date of Birth/Sex: 1971-01-23 (50 y.o. M) Treating RN: Dolan Amen Primary Care Provider: PATIENT, NO Other Clinician: Referring Provider: Barkley Boards Treating Provider/Extender: Skipper Cliche in Treatment: 17 Information Obtained from: Patient Chief Complaint Right LE Ulcer Electronic Signature(s) Signed: 05/11/2021 9:49:51 AM By: Worthy Keeler PA-C Entered By: Worthy Keeler on 05/11/2021 09:49:51 [place], [place] ([id number]) -------------------------------------------------------------------------------- HPI Details Patient Name: Nicolas Cousin L. Date of Service: 05/11/2021 9:00 AM Medical Record Number: [id number] Patient Account Number: [id number] Date of Birth/Sex: 11-May-1971 (50 y.o. M) Treating RN: Dolan Amen Primary Care Provider: PATIENT, NO Other Clinician: Referring Provider: Barkley Boards Treating Provider/Extender: Skipper Cliche in Treatment: 17 History of Present Illness HPI Description: 01/12/2021 upon evaluation today patient appears to be doing somewhat poorly in regard to a wound on his right medial lower leg. Currently he tells me that this has been going on since around the beginning of April. He has done a telehealth visit with a physician who initially gave him a triamcinolone cream along with Keflex for 5 days. Subsequently he ended up being seen at walk-in urgent care where they also given Keflex for 7 days that seem to be doing better for him. Overall he tells me that things have improved from the standpoint of redness. Fortunately there does not appear to be any signs of systemic infection to be honest right now even locally I do not see any evidence of infection right now. He does have 1 main wound with some scattering areas  that look like they may have been small ulcerations but again for the most part these have filled in. This does look almost to be more of a vasculitis type scenario based on what I am seeing. Patient does have chronic venous insufficiency but is not wearing any compression even though it sounds like he has been told before he should. 5/27; patient with small wounds on his right medial ankle. He has chronic venous insufficiency and stasis dermatitis a large open wound and several smaller areas all of which looks better than on presentation last week according to her nursing staff 01/26/2021 upon evaluation today patient appears to be doing well with regard to his wound. He is making good progress. With that being said there is some need currently for sharp debridement in regard to the wound there is some necrotic debris it is almost completely cleaned off but not completely. He is in agreement with that plan today. Fortunately there does not appear to be any evidence of active infection which is great and I am very pleased in that regard. He still wishes he did not have to wear the compression wrap. He did get his compression socks from [facility] therapy though they are in the 15 to 20 mmHg range they did not have any his length as he is a tall guy in the 20 to 30 mmHg range. 02/02/2021 upon evaluation today patient appears to be doing excellent in regard to his leg ulcer. He has been tolerating the dressing changes without complication. Fortunately there is no evidence of active infection at this time. No fevers, chills, nausea, vomiting, or diarrhea. 02/09/2021 upon evaluation today patient's wound actually showing signs of improvement. Fortunately there does not appear to be any evidence of active infection which is great news overall very pleased with where  things stand today. 02/16/2021 upon evaluation today patient's wound is showing some signs of improvement. He did have a fairly aggressive debridement  last week he notes that he had quite a bit of discomfort he was some swelling following. With that being said I think this was to be expected considering what we had to do from a debridement standpoint. The good news is there does not appear to be any evidence of infection currently nonetheless I do believe that the patient may have some signs of vasculitis here. Based on the overall appearance of the wound today and beginning to suspect this. For that reason I think he would benefit from starting him on triamcinolone ointment to the wound bed and some of the immediate periwound to try to help out in this regard. 02/23/2021 upon evaluation today patient appears to be doing well with regard to his wound. He is actually making some progress here. The wound is roughly about the same size but the overall appearance of the wound bed is much improved. I do not see any signs of active infection at this time which is great news. No fevers, chills, nausea, vomiting, or diarrhea. 03/02/2021 upon evaluation today patient appears to be doing well with regard to his wound. I think the biggest issue I see right now is that with Hydrofera Blue this wound is staying somewhat dry. I think he could benefit from the use of collagen to try to help with getting this to stimulate some additional growth. He is in agreement with the plan. I think this also had a little bit more moisture which would be helpful at this point. 03/09/2021 on evaluation today patient appears to be doing about the same in regard to his wound is measuring a little bit smaller but still he does have the surface of the wound which is not quite as healthy as I would like to see. Fortunately there does not appear to be any active infection at this time. No fevers, chills, nausea, vomiting, or diarrhea. 03/16/2021 upon evaluation today patient appears to be doing better with regard to the overall appearance of his wound bed. There does not appear to be any  signs of significant slough buildup I think the Iodoflex has done very well for him. The patient's happy to hear this and see the improvement. With that being said he is continuing to have a little bit of expansion of the wound again I think this is more related to #1 edema control and #2 the fact that again we will get the wound bed clean. I feel like were pretty much today as far as cleaning up the wound bed is concerned now we just need to make sure the edema is optimized. I am just not certain that his compression socks are doing the job. 03/23/2021 upon evaluation today patient unfortunately appears to be doing a little bit worse with regard to his wound I was actually expecting this to be much better this week. With that being said I am getting concerned about the possibility of pyoderma gangrenosum. I did print off some information for the patient in this regard. Subsequently I am also can see about starting him on some steroid cream as well as an oral steroid. 03/30/2021 upon evaluation today patient appears to be doing significantly better in regard to his wound. He has started using the clobetasol which seems to have made a excellent improvement in regard to the wound today. 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 Either way at this point I would recommend that we avoid sharp debridements I think that is probably can be the best option based on what I am seeing currently. The patient voiced understanding. He also had questions about taking the prednisone with regard to his immune system. I explained that I do believe that that can be an issue long-term but for short-term which is mainly what we are doing here I do not think it is going to be a major issue for him and in fact I think would help more especially considering  the pyoderma here and the fact that is more of an autoimmune type issue than not using the prednisone. 04/06/2021 upon evaluation today patient's wound actually showed signs of excellent improvement. This definitely has gone a lot better since we started with the topical steroids and overall I am extremely pleased with where things stand. No fevers, chills, nausea, vomiting, or diarrhea. With that being said the patient continues to feel much better and states that the pain is actually dramatically improved as well. This definitely appears [place]more[place], Sparger Staves ([id number]) to be a pyoderma situation. 04/13/2021 upon evaluation today patient appears to be doing well with regard to his wound. Fortunately there does not appear to be any signs of active infection at this time. No fevers, chills, nausea, vomiting, or diarrhea. With that being said I do believe that he is overall doing extremely well. This is measuring smaller and does seem to be headed in the right direction. 04/20/2021 upon evaluation today patient appears to be doing well with regard to his wound. He still has quite a bit of slough covering the surface of the wound. With that being said we previously debrided the wound he actually had a bit of worsening and therefore I am somewhat reluctant to proceed with any further debridement at this point. In fact this got quite a bit larger postdebridement. 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 Nonetheless I think it is can to take time to get this to heal. 04/27/2021 upon evaluation today patient appears to be doing well with regard to his wound. This is measuring smaller this week yet again and overall I am extremely pleased with where we stand currently. Fortunately there does not appear to be any evidence of active infection which is great news. No fevers, chills, nausea, vomiting, or diarrhea. 05/04/2021 upon evaluation today patient's wound actually has a significant amount of biofilm  noted on the surface of the wound. Fortunately there does not appear to be any evidence of active infection at this time which is great news and overall I am extremely pleased with where we stand today. However I do think we may need to try to see about a sharp debridement to clear away some of the slough and biofilm on the surface of the wound to try to help this to heal more effectively and quickly. Patient voiced understanding. 05/11/2021 upon evaluation today patient appears to be doing better in regard to his wound. He has been tolerating the dressing changes without complication. Fortunately there does not appear to be any signs of active infection at this time which is great news. No fever chills noted Electronic Signature(s) Signed: 05/11/2021 5:32:29 PM By: Worthy Keeler PA-C Entered By: Worthy Keeler on 05/11/2021 17:32:28  PATSY, VILLAMAR Carlean Jews ([id number]) -------------------------------------------------------------------------------- Physical Exam Details Patient Name: JAVIONNE, RUTER L. Date of Service: 05/11/2021 9:00 AM Medical Record Number: [id number] Patient Account Number: [id number] Date of Birth/Sex: Jun 26, 1971 (50 y.o. M) Treating RN: Dolan Amen Primary Care Provider: PATIENT, NO Other Clinician: Referring Provider: Barkley Boards Treating Provider/Extender: Skipper Cliche in Treatment: 61 Constitutional Well-nourished and well-hydrated in no acute distress. Respiratory normal breathing without difficulty. Psychiatric this patient is able to make decisions and demonstrates good insight into disease process. Alert and Oriented x 3. pleasant and cooperative. Notes Patient's wound bed showed signs of better granulation and epithelization today. Overall very pleased with where we stand I do not see any signs of infection and in general I think that the debridement last week which I did actually has been beneficial. I am hopeful he will continue to show signs of improvement  going forward. Electronic Signature(s) Signed: 05/11/2021 5:32:53 PM By: Worthy Keeler PA-C Entered By: Worthy Keeler on 05/11/2021 17:32:53 [name] ([id number]) -------------------------------------------------------------------------------- Physician Orders Details Patient Name: SCHAD, MELKA L. Date of Service: 05/11/2021 9:00 AM Medical Record Number: [id number] Patient Account Number: [id number] Date of Birth/Sex: [birth year] (50 y.o. M) Treating RN: Dolan Amen Primary Care Provider: PATIENT, NO Other Clinician: Referring Provider: Barkley Boards Treating Provider/Extender: Skipper Cliche in Treatment: 17 Verbal / Phone Orders: No Diagnosis Coding ICD-10 Coding Code Description I87.2 Venous insufficiency (chronic) (peripheral) L97.812 Non-pressure chronic ulcer of other part of right lower leg with fat layer exposed Follow-up Appointments o Return Appointment in 2 weeks. Bathing/ Shower/ Hygiene o May shower; gently cleanse wound with antibacterial soap, rinse and pat dry prior to dressing wounds Edema Control - Lymphedema / Segmental Compressive Device / Other Bilateral Lower Extremities o Patient to wear own compression stockings. Remove compression stockings every night before going to bed and put on every morning when getting up. o Elevate, Exercise Daily and Avoid Standing for Long Periods of Time. o Elevate legs to the level of the heart and pump ankles as often as possible o Elevate leg(s) parallel to the floor when sitting. Wound Treatment Wound #1 - Lower Leg Wound Laterality: Right, Medial Cleanser: Soap and Water 1 x Per Day/30 Days Discharge Instructions: Gently cleanse wound with antibacterial soap, rinse and pat dry prior to dressing wounds Topical: Clobetasol Propionate ointment 0.05%, 60 (g) tube 1 x Per Day/30 Days Discharge Instructions: Apply to wound bed and around wound Primary Dressing: Hydrofera Blue Ready Transfer Foam,  2.5x2.5 (in/in) 1 x Per Day/30 Days Discharge Instructions: Apply Hydrofera Blue Ready to wound bed as directed Secondary Dressing: Bordered Gauze Sterile-HBD 4x4 (in/in) 1 x Per Day/30 Days Discharge Instructions: Cover wound with Bordered Guaze Sterile as directed Electronic Signature(s) Signed: 05/11/2021 4:40:04 PM By: Dolan Amen RN Signed: 05/11/2021 5:37:36 PM By: Worthy Keeler PA-C Entered By: Dolan Amen on 05/11/2021 09:53:38 [place], [place] ([id number]) -------------------------------------------------------------------------------- Problem List Details Patient Name: JERRAN, WARSHAWSKY L. Date of Service: 05/11/2021 9:00 AM Medical Record Number: [id number] Patient Account Number: [id number] Date of Birth/Sex: 03-07-1971 (50 y.o. M) Treating RN: Dolan Amen Primary Care Provider: PATIENT, NO Other Clinician: Referring Provider: Barkley Boards Treating Provider/Extender: Skipper Cliche in Treatment: 17 Active Problems ICD-10 Encounter Code Description Active Date MDM Diagnosis I87.2 Venous insufficiency (chronic) (peripheral) 01/12/2021 No Yes L97.812 Non-pressure chronic ulcer of other part of right lower leg with fat layer 01/12/2021 No Yes exposed Inactive Problems Resolved Problems Electronic Signature(s) Signed: 05/11/2021 9:49:46 AM By: Melburn Hake,  Serene Kopf PA-C Entered By: Worthy Keeler on 05/11/2021 09:49:45 [place], Lambert L. ([id number]) -------------------------------------------------------------------------------- Progress Note Details Patient Name: RAZIEL, CAMIRE L. Date of Service: 05/11/2021 9:00 AM Medical Record Number: [id number] Patient Account Number: [id number] Date of Birth/Sex: 25-Feb-1971 (50 y.o. M) Treating RN: Dolan Amen Primary Care Provider: PATIENT, NO Other Clinician: Referring Provider: Barkley Boards Treating Provider/Extender: Skipper Cliche in Treatment: 17 Subjective Chief Complaint Information obtained from Patient Right LE  Ulcer History of Present Illness (HPI) 01/12/2021 upon evaluation today patient appears to be doing somewhat poorly in regard to a wound on his right medial lower leg. Currently he tells me that this has been going on since around the beginning of April. He has done a telehealth visit with a physician who initially gave him a triamcinolone cream along with Keflex for 5 days. Subsequently he ended up being seen at walk-in urgent care where they also given Keflex for 7 days that seem to be doing better for him. Overall he tells me that things have improved from the standpoint of redness. Fortunately there does not appear to be any signs of systemic infection to be honest right now even locally I do not see any evidence of infection right now. He does have 1 main wound with some scattering areas that look like they may have been small ulcerations but again for the most part these have filled in. This does look almost to be more of a vasculitis type scenario based on what I am seeing. Patient does have chronic venous insufficiency but is not wearing any compression even though it sounds like he has been told before he should. 5/27; patient with small wounds on his right medial ankle. He has chronic venous insufficiency and stasis dermatitis a large open wound and several smaller areas all of which looks better than on presentation last week according to her nursing staff 01/26/2021 upon evaluation today patient appears to be doing well with regard to his wound. He is making good progress. With that being said there is some need currently for sharp debridement in regard to the wound there is some necrotic debris it is almost completely cleaned off but not completely. He is in agreement with that plan today. Fortunately there does not appear to be any evidence of active infection which is great and I am very pleased in that regard. He still wishes he did not have to wear the compression wrap. He did get his  compression socks from elastic therapy though they are in the 15 to 20 mmHg range they did not have any his length as he is a tall guy in the 20 to 30 mmHg range. 02/02/2021 upon evaluation today patient appears to be doing excellent in regard to his leg ulcer. He has been tolerating the dressing changes without complication. Fortunately there is no evidence of active infection at this time. No fevers, chills, nausea, vomiting, or diarrhea. 02/09/2021 upon evaluation today patient's wound actually showing signs of improvement. Fortunately there does not appear to be any evidence of active infection which is great news overall very pleased with where things stand today. 02/16/2021 upon evaluation today patient's wound is showing some signs of improvement. He did have a fairly aggressive debridement last week he notes that he had quite a bit of discomfort he was some swelling following. With that being said I think this was to be expected considering what we had to do from a debridement standpoint. The good news is there does not appear  to be any evidence of infection currently nonetheless I do believe that the patient may have some signs of vasculitis here. Based on the overall appearance of the wound today and beginning to suspect this. For that reason I think he would benefit from starting him on triamcinolone ointment to the wound bed and some of the immediate periwound to try to help out in this regard. 02/23/2021 upon evaluation today patient appears to be doing well with regard to his wound. He is actually making some progress here. The wound is roughly about the same size but the overall appearance of the wound bed is much improved. I do not see any signs of active infection at this time which is great news. No fevers, chills, nausea, vomiting, or diarrhea. 03/02/2021 upon evaluation today patient appears to be doing well with regard to his wound. I think the biggest issue I see right now is that  with Hydrofera Blue this wound is staying somewhat dry. I think he could benefit from the use of collagen to try to help with getting this to stimulate some additional growth. He is in agreement with the plan. I think this also had a little bit more moisture which would be helpful at this point. 03/09/2021 on evaluation today patient appears to be doing about the same in regard to his wound is measuring a little bit smaller but still he does have the surface of the wound which is not quite as healthy as I would like to see. Fortunately there does not appear to be any active infection at this time. No fevers, chills, nausea, vomiting, or diarrhea. 03/16/2021 upon evaluation today patient appears to be doing better with regard to the overall appearance of his wound bed. There does not appear to be any signs of significant slough buildup I think the Iodoflex has done very well for him. The patient's happy to hear this and see the improvement. With that being said he is continuing to have a little bit of expansion of the wound again I think this is more related to #1 edema control and #2 the fact that again we will get the wound bed clean. I feel like were pretty much today as far as cleaning up the wound bed is concerned now we just need to make sure the edema is optimized. I am just not certain that his compression socks are doing the job. 03/23/2021 upon evaluation today patient unfortunately appears to be doing a little bit worse with regard to his wound I was actually expecting this to be much better this week. With that being said I am getting concerned about the possibility of pyoderma gangrenosum. I did print off some information for the patient in this regard. Subsequently I am also can see about starting him on some steroid cream as well as an oral steroid. 03/30/2021 upon evaluation today patient appears to be doing significantly better in regard to his wound. He has started using the clobetasol  which seems to have made a excellent improvement in regard to the wound today. 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 Either way at this point I would recommend that we avoid sharp debridements I think that is probably can be the best option based on what I am seeing currently. The patient voiced understanding. He also  had questions about taking the prednisone with regard to his immune system. I explained that I do believe that that can be an issue long-term but for short-term which is mainly what we are doing here I do not think it is going to be a major issue for him and in fact I think would help more especially considering the pyoderma here and the fact that is more of an [place], [place] ([id number]) autoimmune type issue than not using the prednisone. 04/06/2021 upon evaluation today patient's wound actually showed signs of excellent improvement. This definitely has gone a lot better since we started with the topical steroids and overall I am extremely pleased with where things stand. No fevers, chills, nausea, vomiting, or diarrhea. With that being said the patient continues to feel much better and states that the pain is actually dramatically improved as well. 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 04/13/2021 upon evaluation today patient appears to be doing well with regard to his wound. Fortunately there does not appear to be any signs of active infection at this time. No fevers, chills, nausea, vomiting, or diarrhea. With that being said I do believe that he is overall doing extremely well. This is measuring smaller and does seem to be headed in the right direction. 04/20/2021 upon evaluation today patient appears to be doing well with regard to his wound. He still has quite a bit of slough covering the  surface of the wound. With that being said we previously debrided the wound he actually had a bit of worsening and therefore I am somewhat reluctant to proceed with any further debridement at this point. In fact this got quite a bit larger postdebridement. 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 Nonetheless I think it is can to take time to get this to heal. 04/27/2021 upon evaluation today patient appears to be doing well with regard to his wound. This is measuring smaller this week yet again and overall I am extremely pleased with where we stand currently. Fortunately there does not appear to be any evidence of active infection which is great news. No fevers, chills, nausea, vomiting, or diarrhea. 05/04/2021 upon evaluation today patient's wound actually has a significant amount of biofilm noted on the surface of the wound. Fortunately there does not appear to be any evidence of active infection at this time which is great news and overall I am extremely pleased with where we stand today. However I do think we may need to try to see about a sharp debridement to clear away some of the slough and biofilm on the surface of the wound to try to help this to heal more effectively and quickly. Patient voiced understanding. 05/11/2021 upon evaluation today patient appears to be doing better in regard to his wound. He has been tolerating the dressing changes without complication. Fortunately there does not appear to be any signs of active infection at this time which is great news. No fever chills noted Objective Constitutional Well-nourished and well-hydrated in no acute distress. Vitals Time Taken: 9:21 AM, Height: 78 in, Weight: 249 lbs, BMI: 28.8, Temperature: 98.7 F, Pulse: 76 bpm, Respiratory Rate: 18 breaths/min, Blood Pressure: 143/77 mmHg. Respiratory normal breathing without difficulty. Psychiatric this patient is able to make decisions and demonstrates good insight into disease  process. Alert and Oriented x 3. pleasant and cooperative. General Notes: Patient's wound bed showed signs of better granulation and epithelization today. Overall very pleased with where we stand I do not  see any signs of infection and in general I think that the debridement last week which I did actually has been beneficial. I am hopeful he will continue to show signs of improvement going forward. Integumentary (Hair, Skin) Wound #1 status is Open. Original cause of wound was Gradually Appeared. The date acquired was: 11/24/2020. The wound has been in treatment 17 weeks. The wound is located on the Right,Medial Lower Leg. The wound measures 0.8cm length x 1cm width x 0.1cm depth; 0.628cm^2 area and 0.063cm^3 volume. There is Fat Layer (Subcutaneous Tissue) exposed. There is no tunneling or undermining noted. There is a medium amount of serosanguineous drainage noted. The wound margin is flat and intact. There is medium (34-66%) pink granulation within the wound bed. There is a medium (34-66%) amount of necrotic tissue within the wound bed including Adherent Slough. Assessment Active Problems ICD-10 Venous insufficiency (chronic) (peripheral) Non-pressure chronic ulcer of other part of right lower leg with fat layer exposed [place], [place] ([id number]) Plan Follow-up Appointments: Return Appointment in 2 weeks. Bathing/ Shower/ Hygiene: May shower; gently cleanse wound with antibacterial soap, rinse and pat dry prior to dressing wounds Edema Control - Lymphedema / Segmental Compressive Device / Other: Patient to wear own compression stockings. Remove compression stockings every night before going to bed and put on every morning when getting up. Elevate, Exercise Daily and Avoid Standing for Long Periods of Time. Elevate legs to the level of the heart and pump ankles as often as possible Elevate leg(s) parallel to the floor when sitting. WOUND #1: - Lower Leg Wound Laterality: Right,  Medial Cleanser: Soap and Water 1 x Per Day/30 Days Discharge Instructions: Gently cleanse wound with antibacterial soap, rinse and pat dry prior to dressing wounds Topical: Clobetasol Propionate ointment 0.05%, 60 (g) tube 1 x Per Day/30 Days Discharge Instructions: Apply to wound bed and around wound Primary Dressing: Hydrofera Blue Ready Transfer Foam, 2.5x2.5 (in/in) 1 x Per Day/30 Days Discharge Instructions: Apply Hydrofera Blue Ready to wound bed as directed Secondary Dressing: Bordered Gauze Sterile-HBD 4x4 (in/in) 1 x Per Day/30 Days Discharge Instructions: Cover wound with Bordered Guaze Sterile as directed 1. Would recommend currently that we go ahead and continue with the treatment as before. He is using the clobetasol which I think is doing a great job. 2. I am also going to recommend that we continue with the [facility] which I think is also doing excellent for him. 3. I would also suggest to continue to monitor for any signs of infection the right now everything appears to be doing quite well. We will see patient back for reevaluation in 1 week here in the clinic. If anything worsens or changes patient will contact our office for additional recommendations. Electronic Signature(s) Signed: 05/11/2021 5:33:30 PM By: Worthy Keeler PA-C Entered By: Worthy Keeler on 05/11/2021 17:33:30 [place], Raijon Carlean Jews ([id number]) -------------------------------------------------------------------------------- SuperBill Details Patient Name: Nicolas Cousin L. Date of Service: 05/11/2021 Medical Record Number: [id number] Patient Account Number: [id number] Date of Birth/Sex: 1971-07-10 (50 y.o. M) Treating RN: Dolan Amen Primary Care Provider: PATIENT, NO Other Clinician: Referring Provider: Barkley Boards Treating Provider/Extender: Skipper Cliche in Treatment: 17 Diagnosis Coding ICD-10 Codes Code Description I87.2 Venous insufficiency (chronic) (peripheral) L97.812 Non-pressure  chronic ulcer of other part of right lower leg with fat layer exposed Facility Procedures CPT4 Code: [id number] Description: 99213 - WOUND CARE VISIT-LEV 3 EST PT Modifier: Quantity: 1 Physician Procedures CPT4 Code: [id number] Description: [id number] - WC PHYS LEVEL 3 -  EST PT Modifier: Quantity: 1 CPT4 Code: Description: ICD-10 Diagnosis Description I87.2 Venous insufficiency (chronic) (peripheral) L97.812 Non-pressure chronic ulcer of other part of right lower leg with fat la Modifier: yer exposed Quantity: Electronic Signature(s) Signed: 05/11/2021 5:33:41 PM By: Worthy Keeler PA-C Previous Signature: 05/11/2021 4:40:04 PM Version By: Dolan Amen RN Entered By: Worthy Keeler on 05/11/2021 17:33:40

## 2021-05-18 ENCOUNTER — Encounter: Payer: Self-pay | Admitting: Physician Assistant

## 2021-05-25 ENCOUNTER — Encounter: Payer: Self-pay | Admitting: Physician Assistant

## 2021-05-25 ENCOUNTER — Other Ambulatory Visit: Payer: Self-pay

## 2021-05-25 NOTE — Progress Notes (Addendum)
GIULIANO, PREECE ([id number]) Visit Report for 05/25/2021 Chief Complaint Document Details Patient Name: [name], [name]. Date of Service: 05/25/2021 9:00 AM Medical Record Number: [id number] Patient Account Number: [id number] Date of Birth/Sex: 04-16-1971 (50 y.o. M) Treating RN: Dolan Amen Primary Care Provider: PATIENT, NO Other Clinician: Referring Provider: Barkley Boards Treating Provider/Extender: Skipper Cliche in Treatment: 19 Information Obtained from: Patient Chief Complaint Right LE Ulcer Electronic Signature(s) Signed: 05/25/2021 9:34:00 AM By: Worthy Keeler PA-C Entered By: Worthy Keeler on 05/25/2021 09:34:00 Redondo, Tylar Carlean Jews ([id number]) -------------------------------------------------------------------------------- HPI Details Patient Name: Nicolas Cousin L. Date of Service: 05/25/2021 9:00 AM Medical Record Number: [id number] Patient Account Number: [id number] Date of Birth/Sex: [birth year] (50 y.o. M) Treating RN: Dolan Amen Primary Care Provider: PATIENT, NO Other Clinician: Referring Provider: Barkley Boards Treating Provider/Extender: Skipper Cliche in Treatment: 19 History of Present Illness HPI Description: 01/12/2021 upon evaluation today patient appears to be doing somewhat poorly in regard to a wound on his right medial lower leg. Currently he tells me that this has been going on since around the beginning of April. He has done a telehealth visit with a physician who initially gave him a triamcinolone cream along with Keflex for 5 days. Subsequently he ended up being seen at walk-in urgent care where they also given Keflex for 7 days that seem to be doing better for him. Overall he tells me that things have improved from the standpoint of redness. Fortunately there does not appear to be any signs of systemic infection to be honest right now even locally I do not see any evidence of infection right now. He does have 1 main wound with some scattering areas  that look like they may have been small ulcerations but again for the most part these have filled in. This does look almost to be more of a vasculitis type scenario based on what I am seeing. Patient does have chronic venous insufficiency but is not wearing any compression even though it sounds like he has been told before he should. 5/27; patient with small wounds on his right medial ankle. He has chronic venous insufficiency and stasis dermatitis a large open wound and several smaller areas all of which looks better than on presentation last week according to her nursing staff 01/26/2021 upon evaluation today patient appears to be doing well with regard to his wound. He is making good progress. With that being said there is some need currently for sharp debridement in regard to the wound there is some necrotic debris it is almost completely cleaned off but not completely. He is in agreement with that plan today. Fortunately there does not appear to be any evidence of active infection which is great and I am very pleased in that regard. He still wishes he did not have to wear the compression wrap. He did get his compression socks from [facility] though they are in the 15 to 20 mmHg range they did not have any his length as he is a tall guy in the 20 to 30 mmHg range. 02/02/2021 upon evaluation today patient appears to be doing excellent in regard to his leg ulcer. He has been tolerating the dressing changes without complication. Fortunately there is no evidence of active infection at this time. No fevers, chills, nausea, vomiting, or diarrhea. 02/09/2021 upon evaluation today patient's wound actually showing signs of improvement. Fortunately there does not appear to be any evidence of active infection which is great news overall very pleased with where  things stand today. 02/16/2021 upon evaluation today patient's wound is showing some signs of improvement. He did have a fairly aggressive debridement  last week he notes that he had quite a bit of discomfort he was some swelling following. With that being said I think this was to be expected considering what we had to do from a debridement standpoint. The good news is there does not appear to be any evidence of infection currently nonetheless I do believe that the patient may have some signs of vasculitis here. Based on the overall appearance of the wound today and beginning to suspect this. For that reason I think he would benefit from starting him on triamcinolone ointment to the wound bed and some of the immediate periwound to try to help out in this regard. 02/23/2021 upon evaluation today patient appears to be doing well with regard to his wound. He is actually making some progress here. The wound is roughly about the same size but the overall appearance of the wound bed is much improved. I do not see any signs of active infection at this time which is great news. No fevers, chills, nausea, vomiting, or diarrhea. 03/02/2021 upon evaluation today patient appears to be doing well with regard to his wound. I think the biggest issue I see right now is that with Hydrofera Blue this wound is staying somewhat dry. I think he could benefit from the use of collagen to try to help with getting this to stimulate some additional growth. He is in agreement with the plan. I think this also had a little bit more moisture which would be helpful at this point. 03/09/2021 on evaluation today patient appears to be doing about the same in regard to his wound is measuring a little bit smaller but still he does have the surface of the wound which is not quite as healthy as I would like to see. Fortunately there does not appear to be any active infection at this time. No fevers, chills, nausea, vomiting, or diarrhea. 03/16/2021 upon evaluation today patient appears to be doing better with regard to the overall appearance of his wound bed. There does not appear to be any  signs of significant slough buildup I think the Iodoflex has done very well for him. The patient's happy to hear this and see the improvement. With that being said he is continuing to have a little bit of expansion of the wound again I think this is more related to #1 edema control and #2 the fact that again we will get the wound bed clean. I feel like were pretty much today as far as cleaning up the wound bed is concerned now we just need to make sure the edema is optimized. I am just not certain that his compression socks are doing the job. 03/23/2021 upon evaluation today patient unfortunately appears to be doing a little bit worse with regard to his wound I was actually expecting this to be much better this week. With that being said I am getting concerned about the possibility of pyoderma gangrenosum. I did print off some information for the patient in this regard. Subsequently I am also can see about starting him on some steroid cream as well as an oral steroid. 03/30/2021 upon evaluation today patient appears to be doing significantly better in regard to his wound. He has started using the clobetasol which seems to have made a excellent improvement in regard to the wound today. 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 Either way at this point I would recommend that we avoid sharp debridements I think that is probably can be the best option based on what I am seeing currently. The patient voiced understanding. He also had questions about taking the prednisone with regard to his immune system. I explained that I do believe that that can be an issue long-term but for short-term which is mainly what we are doing here I do not think it is going to be a major issue for him and in fact I think would help more especially considering  the pyoderma here and the fact that is more of an autoimmune type issue than not using the prednisone. 04/06/2021 upon evaluation today patient's wound actually showed signs of excellent improvement. This definitely has gone a lot better since we started with the topical steroids and overall I am extremely pleased with where things stand. No fevers, chills, nausea, vomiting, or diarrhea. With that being said the patient continues to feel much better and states that the pain is actually dramatically improved as well. This definitely appears Parvin, Stetzer Winthrop ([id number]) to be a pyoderma situation. 04/13/2021 upon evaluation today patient appears to be doing well with regard to his wound. Fortunately there does not appear to be any signs of active infection at this time. No fevers, chills, nausea, vomiting, or diarrhea. With that being said I do believe that he is overall doing extremely well. This is measuring smaller and does seem to be headed in the right direction. 04/20/2021 upon evaluation today patient appears to be doing well with regard to his wound. He still has quite a bit of slough covering the surface of the wound. With that being said we previously debrided the wound he actually had a bit of worsening and therefore I am somewhat reluctant to proceed with any further debridement at this point. In fact this got quite a bit larger postdebridement. 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 Nonetheless I think it is can to take time to get this to heal. 04/27/2021 upon evaluation today patient appears to be doing well with regard to his wound. This is measuring smaller this week yet again and overall I am extremely pleased with where we stand currently. Fortunately there does not appear to be any evidence of active infection which is great news. No fevers, chills, nausea, vomiting, or diarrhea. 05/04/2021 upon evaluation today patient's wound actually has a significant amount of biofilm  noted on the surface of the wound. Fortunately there does not appear to be any evidence of active infection at this time which is great news and overall I am extremely pleased with where we stand today. However I do think we may need to try to see about a sharp debridement to clear away some of the slough and biofilm on the surface of the wound to try to help this to heal more effectively and quickly. Patient voiced understanding. 05/11/2021 upon evaluation today patient appears to be doing better in regard to his wound. He has been tolerating the dressing changes without complication. Fortunately there does not appear to be any signs of active infection at this time which is great news. No fever chills noted 05/25/2021 upon evaluation today patient's wound is actually showing signs of excellent improvement. Fortunately there does not appear to  be any evidence of infection at this time which is great news and overall I am extremely pleased with where we stand currently. I do feel like that the patient is improving although is very slow where little by little seen in this improvement. Electronic Signature(s) Signed: 05/25/2021 11:03:26 AM By: Worthy Keeler PA-C Entered By: Worthy Keeler on 05/25/2021 11:03:26 [name] ([id number]) -------------------------------------------------------------------------------- Physical Exam Details Patient Name: JAQUAIL, MCLEES L. Date of Service: 05/25/2021 9:00 AM Medical Record Number: [id number] Patient Account Number: [id number] Date of Birth/Sex: 20-Aug-1971 (50 y.o. M) Treating RN: Dolan Amen Primary Care Provider: PATIENT, NO Other Clinician: Referring Provider: Barkley Boards Treating Provider/Extender: Skipper Cliche in Treatment: 35 Constitutional Well-nourished and well-hydrated in no acute distress. Respiratory normal breathing without difficulty. Psychiatric this patient is able to make decisions and demonstrates good insight into  disease process. Alert and Oriented x 3. pleasant and cooperative. Notes Upon inspection patient's wound bed showed signs of good granulation epithelization at this point. Fortunately there is no sign of active infection which is great news and overall I am extremely pleased with where we stand. Electronic Signature(s) Signed: 05/25/2021 11:03:47 AM By: Worthy Keeler PA-C Entered By: Worthy Keeler on 05/25/2021 11:03:47 [name] ([id number]) -------------------------------------------------------------------------------- Physician Orders Details Patient Name: RAHSHAWN, REMO L. Date of Service: 05/25/2021 9:00 AM Medical Record Number: [id number] Patient Account Number: [id number] Date of Birth/Sex: [birth year] (50 y.o. M) Treating RN: Dolan Amen Primary Care Provider: PATIENT, NO Other Clinician: Referring Provider: Barkley Boards Treating Provider/Extender: Skipper Cliche in Treatment: 64 Verbal / Phone Orders: No Diagnosis Coding ICD-10 Coding Code Description I87.2 Venous insufficiency (chronic) (peripheral) L97.812 Non-pressure chronic ulcer of other part of right lower leg with fat layer exposed Follow-up Appointments o Return Appointment in 2 weeks. Bathing/ Shower/ Hygiene o May shower; gently cleanse wound with antibacterial soap, rinse and pat dry prior to dressing wounds Edema Control - Lymphedema / Segmental Compressive Device / Other Bilateral Lower Extremities o Patient to wear own compression stockings. Remove compression stockings every night before going to bed and put on every morning when getting up. o Elevate, Exercise Daily and Avoid Standing for Long Periods of Time. o Elevate legs to the level of the heart and pump ankles as often as possible o Elevate leg(s) parallel to the floor when sitting. Wound Treatment Wound #1 - Lower Leg Wound Laterality: Right, Medial Cleanser: Soap and Water 1 x Per Day/30 Days Discharge Instructions:  Gently cleanse wound with antibacterial soap, rinse and pat dry prior to dressing wounds Topical: Clobetasol Propionate ointment 0.05%, 60 (g) tube 1 x Per Day/30 Days Discharge Instructions: Apply to wound bed and around wound Primary Dressing: Hydrofera Blue Ready Transfer Foam, 2.5x2.5 (in/in) 1 x Per Day/30 Days Discharge Instructions: Apply Hydrofera Blue Ready to wound bed as directed Secondary Dressing: Bordered Gauze Sterile-HBD 4x4 (in/in) 1 x Per Day/30 Days Discharge Instructions: Cover wound with Bordered Guaze Sterile as directed Electronic Signature(s) Signed: 05/25/2021 1:33:06 PM By: Dolan Amen RN Signed: 05/25/2021 3:01:26 PM By: Worthy Keeler PA-C Entered By: Dolan Amen on 05/25/2021 10:16:52 Callejo, Hagerman ([id number]) -------------------------------------------------------------------------------- Problem List Details Patient Name: YUUKI, SKEENS L. Date of Service: 05/25/2021 9:00 AM Medical Record Number: [id number] Patient Account Number: [id number] Date of Birth/Sex: 1971/05/04 (50 y.o. M) Treating RN: Dolan Amen Primary Care Provider: PATIENT, NO Other Clinician: Referring Provider: Barkley Boards Treating Provider/Extender: Skipper Cliche in Treatment: 19 Active Problems ICD-10 Encounter Code Description  Active Date MDM Diagnosis I87.2 Venous insufficiency (chronic) (peripheral) 01/12/2021 No Yes L97.812 Non-pressure chronic ulcer of other part of right lower leg with fat layer 01/12/2021 No Yes exposed Inactive Problems Resolved Problems Electronic Signature(s) Signed: 05/25/2021 9:33:49 AM By: Worthy Keeler PA-C Entered By: Worthy Keeler on 05/25/2021 09:33:49 Schaus, Karston L. ([id number]) -------------------------------------------------------------------------------- Progress Note Details Patient Name: Nicolas Cousin L. Date of Service: 05/25/2021 9:00 AM Medical Record Number: [id number] Patient Account Number: [id number] Date of  Birth/Sex: 1971/04/27 (50 y.o. M) Treating RN: Dolan Amen Primary Care Provider: PATIENT, NO Other Clinician: Referring Provider: Barkley Boards Treating Provider/Extender: Skipper Cliche in Treatment: 19 Subjective Chief Complaint Information obtained from Patient Right LE Ulcer History of Present Illness (HPI) 01/12/2021 upon evaluation today patient appears to be doing somewhat poorly in regard to a wound on his right medial lower leg. Currently he tells me that this has been going on since around the beginning of April. He has done a telehealth visit with a physician who initially gave him a triamcinolone cream along with Keflex for 5 days. Subsequently he ended up being seen at walk-in urgent care where they also given Keflex for 7 days that seem to be doing better for him. Overall he tells me that things have improved from the standpoint of redness. Fortunately there does not appear to be any signs of systemic infection to be honest right now even locally I do not see any evidence of infection right now. He does have 1 main wound with some scattering areas that look like they may have been small ulcerations but again for the most part these have filled in. This does look almost to be more of a vasculitis type scenario based on what I am seeing. Patient does have chronic venous insufficiency but is not wearing any compression even though it sounds like he has been told before he should. 5/27; patient with small wounds on his right medial ankle. He has chronic venous insufficiency and stasis dermatitis a large open wound and several smaller areas all of which looks better than on presentation last week according to her nursing staff 01/26/2021 upon evaluation today patient appears to be doing well with regard to his wound. He is making good progress. With that being said there is some need currently for sharp debridement in regard to the wound there is some necrotic debris it is almost  completely cleaned off but not completely. He is in agreement with that plan today. Fortunately there does not appear to be any evidence of active infection which is great and I am very pleased in that regard. He still wishes he did not have to wear the compression wrap. He did get his compression socks from [facility] though they are in the 15 to 20 mmHg range they did not have any his length as he is a tall guy in the 20 to 30 mmHg range. 02/02/2021 upon evaluation today patient appears to be doing excellent in regard to his leg ulcer. He has been tolerating the dressing changes without complication. Fortunately there is no evidence of active infection at this time. No fevers, chills, nausea, vomiting, or diarrhea. 02/09/2021 upon evaluation today patient's wound actually showing signs of improvement. Fortunately there does not appear to be any evidence of active infection which is great news overall very pleased with where things stand today. 02/16/2021 upon evaluation today patient's wound is showing some signs of improvement. He did have a fairly aggressive debridement last week he  notes that he had quite a bit of discomfort he was some swelling following. With that being said I think this was to be expected considering what we had to do from a debridement standpoint. The good news is there does not appear to be any evidence of infection currently nonetheless I do believe that the patient may have some signs of vasculitis here. Based on the overall appearance of the wound today and beginning to suspect this. For that reason I think he would benefit from starting him on triamcinolone ointment to the wound bed and some of the immediate periwound to try to help out in this regard. 02/23/2021 upon evaluation today patient appears to be doing well with regard to his wound. He is actually making some progress here. The wound is roughly about the same size but the overall appearance of the wound bed is  much improved. I do not see any signs of active infection at this time which is great news. No fevers, chills, nausea, vomiting, or diarrhea. 03/02/2021 upon evaluation today patient appears to be doing well with regard to his wound. I think the biggest issue I see right now is that with Hydrofera Blue this wound is staying somewhat dry. I think he could benefit from the use of collagen to try to help with getting this to stimulate some additional growth. He is in agreement with the plan. I think this also had a little bit more moisture which would be helpful at this point. 03/09/2021 on evaluation today patient appears to be doing about the same in regard to his wound is measuring a little bit smaller but still he does have the surface of the wound which is not quite as healthy as I would like to see. Fortunately there does not appear to be any active infection at this time. No fevers, chills, nausea, vomiting, or diarrhea. 03/16/2021 upon evaluation today patient appears to be doing better with regard to the overall appearance of his wound bed. There does not appear to be any signs of significant slough buildup I think the Iodoflex has done very well for him. The patient's happy to hear this and see the improvement. With that being said he is continuing to have a little bit of expansion of the wound again I think this is more related to #1 edema control and #2 the fact that again we will get the wound bed clean. I feel like were pretty much today as far as cleaning up the wound bed is concerned now we just need to make sure the edema is optimized. I am just not certain that his compression socks are doing the job. 03/23/2021 upon evaluation today patient unfortunately appears to be doing a little bit worse with regard to his wound I was actually expecting this to be much better this week. With that being said I am getting concerned about the possibility of pyoderma gangrenosum. I did print off  some information for the patient in this regard. Subsequently I am also can see about starting him on some steroid cream as well as an oral steroid. 03/30/2021 upon evaluation today patient appears to be doing significantly better in regard to his wound. He has started using the clobetasol which seems to have made a excellent improvement in regard to the wound today. 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 Either way at this point I would recommend that we avoid sharp debridements I think that is probably can be the best option based on what I am seeing currently. The patient voiced understanding. He also had questions about taking the prednisone with regard to his immune system. I explained that I do believe that that can be an issue long-term but for short-term which is mainly what we are doing here I do not think it is going to be a major issue for him and in fact I think would help more especially considering the pyoderma here and the fact that is more of an [name], [name] ([id number]) autoimmune type issue than not using the prednisone. 04/06/2021 upon evaluation today patient's wound actually showed signs of excellent improvement. This definitely has gone a lot better since we started with the topical steroids and overall I am extremely pleased with where things stand. No fevers, chills, nausea, vomiting, or diarrhea. With that being said the patient continues to feel much better and states that the pain is actually dramatically improved as well. 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 04/13/2021 upon evaluation today patient appears to be doing well with regard to his wound. Fortunately there does not appear to be any signs of active infection at this time. No fevers, chills, nausea, vomiting, or diarrhea. With  that being said I do believe that he is overall doing extremely well. This is measuring smaller and does seem to be headed in the right direction. 04/20/2021 upon evaluation today patient appears to be doing well with regard to his wound. He still has quite a bit of slough covering the surface of the wound. With that being said we previously debrided the wound he actually had a bit of worsening and therefore I am somewhat reluctant to proceed with any further debridement at this point. In fact this got quite a bit larger postdebridement. 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 Nonetheless I think it is can to take time to get this to heal. 04/27/2021 upon evaluation today patient appears to be doing well with regard to his wound. This is measuring smaller this week yet again and overall I am extremely pleased with where we stand currently. Fortunately there does not appear to be any evidence of active infection which is great news. No fevers, chills, nausea, vomiting, or diarrhea. 05/04/2021 upon evaluation today patient's wound actually has a significant amount of biofilm noted on the surface of the wound. Fortunately there does not appear to be any evidence of active infection at this time which is great news and overall I am extremely pleased with where we stand today. However I do think we may need to try to see about a sharp debridement to clear away some of the slough and biofilm on the surface of the wound to try to help this to heal more effectively and quickly. Patient voiced understanding. 05/11/2021 upon evaluation today patient appears to be doing better in regard to his wound. He has been tolerating the dressing changes without complication. Fortunately there does not appear to be any signs of active infection at this time which is great news. No fever chills noted 05/25/2021 upon evaluation today patient's wound is actually showing signs of excellent improvement. Fortunately there  does not appear to be any evidence of infection at this time which is great news and overall I am extremely pleased with where we stand currently. I do  feel like that the patient is improving although is very slow where little by little seen in this improvement. Objective Constitutional Well-nourished and well-hydrated in no acute distress. Vitals Time Taken: 9:35 AM, Height: 78 in, Weight: 249 lbs, BMI: 28.8, Temperature: 98.5 F, Pulse: 76 bpm, Respiratory Rate: 18 breaths/min, Blood Pressure: 124/80 mmHg. Respiratory normal breathing without difficulty. Psychiatric this patient is able to make decisions and demonstrates good insight into disease process. Alert and Oriented x 3. pleasant and cooperative. General Notes: Upon inspection patient's wound bed showed signs of good granulation epithelization at this point. Fortunately there is no sign of active infection which is great news and overall I am extremely pleased with where we stand. Integumentary (Hair, Skin) Wound #1 status is Open. Original cause of wound was Gradually Appeared. The date acquired was: 11/24/2020. The wound has been in treatment 19 weeks. The wound is located on the Right,Medial Lower Leg. The wound measures 0.5cm length x 0.7cm width x 0.2cm depth; 0.275cm^2 area and 0.055cm^3 volume. There is Fat Layer (Subcutaneous Tissue) exposed. There is no tunneling or undermining noted. There is a medium amount of serosanguineous drainage noted. The wound margin is flat and intact. There is medium (34-66%) pink granulation within the wound bed. There is a medium (34-66%) amount of necrotic tissue within the wound bed including Adherent Slough. Assessment Active Problems ICD-10 Venous insufficiency (chronic) (peripheral) Non-pressure chronic ulcer of other part of right lower leg with fat layer exposed [place], [place] ([id number]) Plan Follow-up Appointments: Return Appointment in 2 weeks. Bathing/ Shower/ Hygiene: May  shower; gently cleanse wound with antibacterial soap, rinse and pat dry prior to dressing wounds Edema Control - Lymphedema / Segmental Compressive Device / Other: Patient to wear own compression stockings. Remove compression stockings every night before going to bed and put on every morning when getting up. Elevate, Exercise Daily and Avoid Standing for Long Periods of Time. Elevate legs to the level of the heart and pump ankles as often as possible Elevate leg(s) parallel to the floor when sitting. WOUND #1: - Lower Leg Wound Laterality: Right, Medial Cleanser: Soap and Water 1 x Per Day/30 Days Discharge Instructions: Gently cleanse wound with antibacterial soap, rinse and pat dry prior to dressing wounds Topical: Clobetasol Propionate ointment 0.05%, 60 (g) tube 1 x Per Day/30 Days Discharge Instructions: Apply to wound bed and around wound Primary Dressing: Hydrofera Blue Ready Transfer Foam, 2.5x2.5 (in/in) 1 x Per Day/30 Days Discharge Instructions: Apply Hydrofera Blue Ready to wound bed as directed Secondary Dressing: Bordered Gauze Sterile-HBD 4x4 (in/in) 1 x Per Day/30 Days Discharge Instructions: Cover wound with Bordered Guaze Sterile as directed 1. Would recommend that we going continue with the wound care measures as before and the patient is in agreement with plan. This includes the use of the clobetasol to the wound bed which does seem to be doing a great job. 2. I am also going to recommend that we have the patient continue with the border gauze dressing to cover which does seem to be also doing excellent. We will see patient back for reevaluation in 2 weeks here in the clinic. If anything worsens or changes patient will contact our office for additional recommendations. Electronic Signature(s) Signed: 05/25/2021 11:04:22 AM By: Worthy Keeler PA-C Entered By: Worthy Keeler on 05/25/2021 11:04:21 [name]  ([id number]) -------------------------------------------------------------------------------- SuperBill Details Patient Name: Nicolas Cousin L. Date of Service: 05/25/2021 Medical Record Number: [id number] Patient Account Number: [id number] Date of Birth/Sex: 1971/03/10 (50  y.o. M) Treating RN: Dolan Amen Primary Care Provider: PATIENT, NO Other Clinician: Referring Provider: Barkley Boards Treating Provider/Extender: Skipper Cliche in Treatment: 19 Diagnosis Coding ICD-10 Codes Code Description I87.2 Venous insufficiency (chronic) (peripheral) L97.812 Non-pressure chronic ulcer of other part of right lower leg with fat layer exposed Facility Procedures CPT4 Code: [id number] Description: [facility] VISIT-LEV 3 EST PT Modifier: Quantity: 1 Physician Procedures CPT4 Code: 7619509 Description: 32671 - WC PHYS LEVEL 4 - EST PT Modifier: Quantity: 1 CPT4 Code: Description: ICD-10 Diagnosis Description I87.2 Venous insufficiency (chronic) (peripheral) L97.812 Non-pressure chronic ulcer of other part of right lower leg with fat la Modifier: yer exposed Quantity: Electronic Signature(s) Signed: 05/25/2021 11:04:43 AM By: Worthy Keeler PA-C Entered By: Worthy Keeler on 05/25/2021 11:04:43

## 2021-06-01 ENCOUNTER — Other Ambulatory Visit: Payer: Self-pay

## 2021-06-01 ENCOUNTER — Encounter: Payer: Self-pay | Attending: Physician Assistant | Admitting: Physician Assistant

## 2021-06-01 DIAGNOSIS — L97812 Non-pressure chronic ulcer of other part of right lower leg with fat layer exposed: Secondary | ICD-10-CM | POA: Insufficient documentation

## 2021-06-01 DIAGNOSIS — I872 Venous insufficiency (chronic) (peripheral): Secondary | ICD-10-CM | POA: Insufficient documentation

## 2021-06-01 NOTE — Progress Notes (Addendum)
AMARIUS, TOTO ([id number]) Visit Report for 06/01/2021 Chief Complaint Document Details Patient Name: [name], [name]. Date of Service: 06/01/2021 9:00 AM Medical Record Number: [id number] Patient Account Number: [id number] Date of Birth/Sex: Aug 16, 1971 (50 y.o. M) Treating RN: Dolan Amen Primary Care Provider: PATIENT, NO Other Clinician: Referring Provider: Barkley Boards Treating Provider/Extender: Skipper Cliche in Treatment: 20 Information Obtained from: Patient Chief Complaint Right LE Ulcer Electronic Signature(s) Signed: 06/01/2021 9:20:42 AM By: Worthy Keeler PA-C Entered By: Worthy Keeler on 06/01/2021 09:20:42 [name] ([id number]) -------------------------------------------------------------------------------- HPI Details Patient Name: Nicolas Cousin L. Date of Service: 06/01/2021 9:00 AM Medical Record Number: [id number] Patient Account Number: [id number] Date of Birth/Sex: 10/29/1970 (50 y.o. M) Treating RN: Dolan Amen Primary Care Provider: PATIENT, NO Other Clinician: Referring Provider: Barkley Boards Treating Provider/Extender: Skipper Cliche in Treatment: 20 History of Present Illness HPI Description: 01/12/2021 upon evaluation today patient appears to be doing somewhat poorly in regard to a wound on his right medial lower leg. Currently he tells me that this has been going on since around the beginning of April. He has done a telehealth visit with a physician who initially gave him a triamcinolone cream along with Keflex for 5 days. Subsequently he ended up being seen at walk-in urgent care where they also given Keflex for 7 days that seem to be doing better for him. Overall he tells me that things have improved from the standpoint of redness. Fortunately there does not appear to be any signs of systemic infection to be honest right now even locally I do not see any evidence of infection right now. He does have 1 main wound with some scattering areas  that look like they may have been small ulcerations but again for the most part these have filled in. This does look almost to be more of a vasculitis type scenario based on what I am seeing. Patient does have chronic venous insufficiency but is not wearing any compression even though it sounds like he has been told before he should. 5/27; patient with small wounds on his right medial ankle. He has chronic venous insufficiency and stasis dermatitis a large open wound and several smaller areas all of which looks better than on presentation last week according to her nursing staff 01/26/2021 upon evaluation today patient appears to be doing well with regard to his wound. He is making good progress. With that being said there is some need currently for sharp debridement in regard to the wound there is some necrotic debris it is almost completely cleaned off but not completely. He is in agreement with that plan today. Fortunately there does not appear to be any evidence of active infection which is great and I am very pleased in that regard. He still wishes he did not have to wear the compression wrap. He did get his compression socks from [facility] though they are in the 15 to 20 mmHg range they did not have any his length as he is a tall guy in the 20 to 30 mmHg range. 02/02/2021 upon evaluation today patient appears to be doing excellent in regard to his leg ulcer. He has been tolerating the dressing changes without complication. Fortunately there is no evidence of active infection at this time. No fevers, chills, nausea, vomiting, or diarrhea. 02/09/2021 upon evaluation today patient's wound actually showing signs of improvement. Fortunately there does not appear to be any evidence of active infection which is great news overall very pleased with where  things stand today. 02/16/2021 upon evaluation today patient's wound is showing some signs of improvement. He did have a fairly aggressive debridement  last week he notes that he had quite a bit of discomfort he was some swelling following. With that being said I think this was to be expected considering what we had to do from a debridement standpoint. The good news is there does not appear to be any evidence of infection currently nonetheless I do believe that the patient may have some signs of vasculitis here. Based on the overall appearance of the wound today and beginning to suspect this. For that reason I think he would benefit from starting him on triamcinolone ointment to the wound bed and some of the immediate periwound to try to help out in this regard. 02/23/2021 upon evaluation today patient appears to be doing well with regard to his wound. He is actually making some progress here. The wound is roughly about the same size but the overall appearance of the wound bed is much improved. I do not see any signs of active infection at this time which is great news. No fevers, chills, nausea, vomiting, or diarrhea. 03/02/2021 upon evaluation today patient appears to be doing well with regard to his wound. I think the biggest issue I see right now is that with Hydrofera Blue this wound is staying somewhat dry. I think he could benefit from the use of collagen to try to help with getting this to stimulate some additional growth. He is in agreement with the plan. I think this also had a little bit more moisture which would be helpful at this point. 03/09/2021 on evaluation today patient appears to be doing about the same in regard to his wound is measuring a little bit smaller but still he does have the surface of the wound which is not quite as healthy as I would like to see. Fortunately there does not appear to be any active infection at this time. No fevers, chills, nausea, vomiting, or diarrhea. 03/16/2021 upon evaluation today patient appears to be doing better with regard to the overall appearance of his wound bed. There does not appear to be any  signs of significant slough buildup I think the Iodoflex has done very well for him. The patient's happy to hear this and see the improvement. With that being said he is continuing to have a little bit of expansion of the wound again I think this is more related to #1 edema control and #2 the fact that again we will get the wound bed clean. I feel like were pretty much today as far as cleaning up the wound bed is concerned now we just need to make sure the edema is optimized. I am just not certain that his compression socks are doing the job. 03/23/2021 upon evaluation today patient unfortunately appears to be doing a little bit worse with regard to his wound I was actually expecting this to be much better this week. With that being said I am getting concerned about the possibility of pyoderma gangrenosum. I did print off some information for the patient in this regard. Subsequently I am also can see about starting him on some steroid cream as well as an oral steroid. 03/30/2021 upon evaluation today patient appears to be doing significantly better in regard to his wound. He has started using the clobetasol which seems to have made a excellent improvement in regard to the wound today. 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 Either way at this point I would recommend that we avoid sharp debridements I think that is probably can be the best option based on what I am seeing currently. The patient voiced understanding. He also had questions about taking the prednisone with regard to his immune system. I explained that I do believe that that can be an issue long-term but for short-term which is mainly what we are doing here I do not think it is going to be a major issue for him and in fact I think would help more especially considering  the pyoderma here and the fact that is more of an autoimmune type issue than not using the prednisone. 04/06/2021 upon evaluation today patient's wound actually showed signs of excellent improvement. This definitely has gone a lot better since we started with the topical steroids and overall I am extremely pleased with where things stand. No fevers, chills, nausea, vomiting, or diarrhea. With that being said the patient continues to feel much better and states that the pain is actually dramatically improved as well. This definitely appears [name], [name] ([id number]) to be a pyoderma situation. 04/13/2021 upon evaluation today patient appears to be doing well with regard to his wound. Fortunately there does not appear to be any signs of active infection at this time. No fevers, chills, nausea, vomiting, or diarrhea. With that being said I do believe that he is overall doing extremely well. This is measuring smaller and does seem to be headed in the right direction. 04/20/2021 upon evaluation today patient appears to be doing well with regard to his wound. He still has quite a bit of slough covering the surface of the wound. With that being said we previously debrided the wound he actually had a bit of worsening and therefore I am somewhat reluctant to proceed with any further debridement at this point. In fact this got quite a bit larger postdebridement. 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 Nonetheless I think it is can to take time to get this to heal. 04/27/2021 upon evaluation today patient appears to be doing well with regard to his wound. This is measuring smaller this week yet again and overall I am extremely pleased with where we stand currently. Fortunately there does not appear to be any evidence of active infection which is great news. No fevers, chills, nausea, vomiting, or diarrhea. 05/04/2021 upon evaluation today patient's wound actually has a significant amount of biofilm  noted on the surface of the wound. Fortunately there does not appear to be any evidence of active infection at this time which is great news and overall I am extremely pleased with where we stand today. However I do think we may need to try to see about a sharp debridement to clear away some of the slough and biofilm on the surface of the wound to try to help this to heal more effectively and quickly. Patient voiced understanding. 05/11/2021 upon evaluation today patient appears to be doing better in regard to his wound. He has been tolerating the dressing changes without complication. Fortunately there does not appear to be any signs of active infection at this time which is great news. No fever chills noted 05/25/2021 upon evaluation today patient's wound is actually showing signs of excellent improvement. Fortunately there does not appear to  be any evidence of infection at this time which is great news and overall I am extremely pleased with where we stand currently. I do feel like that the patient is improving although is very slow where little by little seen in this improvement. 06/01/2021 upon evaluation today patient appears to be doing well with regard to his wound. She has been tolerating the dressing changes without complication. With that being said it somewhat slow to heal at this point. I do think we could try to switch things up a little bit and see about using collagen in place that what we have been doing. The patient is in agreement with that plan. Electronic Signature(s) Signed: 06/01/2021 9:36:48 AM By: Worthy Keeler PA-C Entered By: Worthy Keeler on 06/01/2021 09:36:48 [name], [name] ([id number]) -------------------------------------------------------------------------------- Physical Exam Details Patient Name: KIVEN, VANGILDER L. Date of Service: 06/01/2021 9:00 AM Medical Record Number: [id number] Patient Account Number: [id number] Date of Birth/Sex: 1970/10/09 (50 y.o.  M) Treating RN: Dolan Amen Primary Care Provider: PATIENT, NO Other Clinician: Referring Provider: Barkley Boards Treating Provider/Extender: Skipper Cliche in Treatment: 18 Constitutional Well-nourished and well-hydrated in no acute distress. Respiratory normal breathing without difficulty. Psychiatric this patient is able to make decisions and demonstrates good insight into disease process. Alert and Oriented x 3. pleasant and cooperative. Notes Upon inspection patient's wound bed actually showed signs of good granulation epithelization at this point. Fortunately there does not appear to be any evidence of active infection which is great news and overall very pleased with where things stand today. Overall I am happy but I do think we could try to help with stimulating some additional growth here we have tried collagen and will show the patient how to do this currently. Electronic Signature(s) Signed: 06/01/2021 9:37:31 AM By: Worthy Keeler PA-C Entered By: Worthy Keeler on 06/01/2021 09:37:31 [name], [name] ([id number]) -------------------------------------------------------------------------------- Physician Orders Details Patient Name: [name], [name] Date of Service: 06/01/2021 9:00 AM Medical Record Number: [id number] Patient Account Number: [id number] Date of Birth/Sex: [birth year] (50 y.o. M) Treating RN: Dolan Amen Primary Care Provider: PATIENT, NO Other Clinician: Referring Provider: Barkley Boards Treating Provider/Extender: Skipper Cliche in Treatment: 20 Verbal / Phone Orders: No Diagnosis Coding ICD-10 Coding Code Description I87.2 Venous insufficiency (chronic) (peripheral) L97.812 Non-pressure chronic ulcer of other part of right lower leg with fat layer exposed Follow-up Appointments o Return Appointment in 1 week. Bathing/ Shower/ Hygiene o May shower; gently cleanse wound with antibacterial soap, rinse and pat dry prior to dressing  wounds Edema Control - Lymphedema / Segmental Compressive Device / Other Bilateral Lower Extremities o Patient to wear own compression stockings. Remove compression stockings every night before going to bed and put on every morning when getting up. o Elevate, Exercise Daily and Avoid Standing for Long Periods of Time. o Elevate legs to the level of the heart and pump ankles as often as possible o Elevate leg(s) parallel to the floor when sitting. Wound Treatment Wound #1 - Lower Leg Wound Laterality: Right, Medial Cleanser: Soap and Water Every Other Day/30 Days Discharge Instructions: Gently cleanse wound with antibacterial soap, rinse and pat dry prior to dressing wounds Topical: Clobetasol Propionate ointment 0.05%, 60 (g) tube Every Other Day/30 Days Discharge Instructions: Apply over collagen Primary Dressing: Prisma 4.34 (in) Every Other Day/30 Days Discharge Instructions: Moisten w/normal saline or sterile water; Cover wound as directed. Do not remove from wound bed. Secondary Dressing: Bordered Gauze Sterile-HBD 4x4 (in/in) Every Other Day/30  Days Discharge Instructions: Cover wound with Bordered Guaze Sterile as directed Electronic Signature(s) Signed: 06/01/2021 4:44:56 PM By: Dolan Amen RN Signed: 06/01/2021 6:01:56 PM By: Worthy Keeler PA-C Entered By: Dolan Amen on 06/01/2021 09:42:19 [name], [name] ([id number]) -------------------------------------------------------------------------------- Problem List Details Patient Name: [name], [name] L. Date of Service: 06/01/2021 9:00 AM Medical Record Number: [id number] Patient Account Number: [id number] Date of Birth/Sex: 02-20-1971 (50 y.o. M) Treating RN: Dolan Amen Primary Care Provider: PATIENT, NO Other Clinician: Referring Provider: Barkley Boards Treating Provider/Extender: Skipper Cliche in Treatment: 20 Active Problems ICD-10 Encounter Code Description Active Date MDM Diagnosis I87.2 Venous  insufficiency (chronic) (peripheral) 01/12/2021 No Yes L97.812 Non-pressure chronic ulcer of other part of right lower leg with fat layer 01/12/2021 No Yes exposed Inactive Problems Resolved Problems Electronic Signature(s) Signed: 06/01/2021 9:20:36 AM By: Worthy Keeler PA-C Entered By: Worthy Keeler on 06/01/2021 09:20:36 [name], [name] ([id number]) -------------------------------------------------------------------------------- Progress Note Details Patient Name: Nicolas Cousin L. Date of Service: 06/01/2021 9:00 AM Medical Record Number: [id number] Patient Account Number: [id number] Date of Birth/Sex: 1971/07/12 (50 y.o. M) Treating RN: Dolan Amen Primary Care Provider: PATIENT, NO Other Clinician: Referring Provider: Barkley Boards Treating Provider/Extender: Skipper Cliche in Treatment: 20 Subjective Chief Complaint Information obtained from Patient Right LE Ulcer History of Present Illness (HPI) 01/12/2021 upon evaluation today patient appears to be doing somewhat poorly in regard to a wound on his right medial lower leg. Currently he tells me that this has been going on since around the beginning of April. He has done a telehealth visit with a physician who initially gave him a triamcinolone cream along with Keflex for 5 days. Subsequently he ended up being seen at walk-in urgent care where they also given Keflex for 7 days that seem to be doing better for him. Overall he tells me that things have improved from the standpoint of redness. Fortunately there does not appear to be any signs of systemic infection to be honest right now even locally I do not see any evidence of infection right now. He does have 1 main wound with some scattering areas that look like they may have been small ulcerations but again for the most part these have filled in. This does look almost to be more of a vasculitis type scenario based on what I am seeing. Patient does have chronic venous  insufficiency but is not wearing any compression even though it sounds like he has been told before he should. 5/27; patient with small wounds on his right medial ankle. He has chronic venous insufficiency and stasis dermatitis a large open wound and several smaller areas all of which looks better than on presentation last week according to her nursing staff 01/26/2021 upon evaluation today patient appears to be doing well with regard to his wound. He is making good progress. With that being said there is some need currently for sharp debridement in regard to the wound there is some necrotic debris it is almost completely cleaned off but not completely. He is in agreement with that plan today. Fortunately there does not appear to be any evidence of active infection which is great and I am very pleased in that regard. He still wishes he did not have to wear the compression wrap. He did get his compression socks from [facility] though they are in the 15 to 20 mmHg range they did not have any his length as he is a tall guy in the 20 to 30 mmHg range.  02/02/2021 upon evaluation today patient appears to be doing excellent in regard to his leg ulcer. He has been tolerating the dressing changes without complication. Fortunately there is no evidence of active infection at this time. No fevers, chills, nausea, vomiting, or diarrhea. 02/09/2021 upon evaluation today patient's wound actually showing signs of improvement. Fortunately there does not appear to be any evidence of active infection which is great news overall very pleased with where things stand today. 02/16/2021 upon evaluation today patient's wound is showing some signs of improvement. He did have a fairly aggressive debridement last week he notes that he had quite a bit of discomfort he was some swelling following. With that being said I think this was to be expected considering what we had to do from a debridement standpoint. The good news is  there does not appear to be any evidence of infection currently nonetheless I do believe that the patient may have some signs of vasculitis here. Based on the overall appearance of the wound today and beginning to suspect this. For that reason I think he would benefit from starting him on triamcinolone ointment to the wound bed and some of the immediate periwound to try to help out in this regard. 02/23/2021 upon evaluation today patient appears to be doing well with regard to his wound. He is actually making some progress here. The wound is roughly about the same size but the overall appearance of the wound bed is much improved. I do not see any signs of active infection at this time which is great news. No fevers, chills, nausea, vomiting, or diarrhea. 03/02/2021 upon evaluation today patient appears to be doing well with regard to his wound. I think the biggest issue I see right now is that with Hydrofera Blue this wound is staying somewhat dry. I think he could benefit from the use of collagen to try to help with getting this to stimulate some additional growth. He is in agreement with the plan. I think this also had a little bit more moisture which would be helpful at this point. 03/09/2021 on evaluation today patient appears to be doing about the same in regard to his wound is measuring a little bit smaller but still he does have the surface of the wound which is not quite as healthy as I would like to see. Fortunately there does not appear to be any active infection at this time. No fevers, chills, nausea, vomiting, or diarrhea. 03/16/2021 upon evaluation today patient appears to be doing better with regard to the overall appearance of his wound bed. There does not appear to be any signs of significant slough buildup I think the Iodoflex has done very well for him. The patient's happy to hear this and see the improvement. With that being said he is continuing to have a little bit of expansion of the  wound again I think this is more related to #1 edema control and #2 the fact that again we will get the wound bed clean. I feel like were pretty much today as far as cleaning up the wound bed is concerned now we just need to make sure the edema is optimized. I am just not certain that his compression socks are doing the job. 03/23/2021 upon evaluation today patient unfortunately appears to be doing a little bit worse with regard to his wound I was actually expecting this to be much better this week. With that being said I am getting concerned about the possibility of pyoderma gangrenosum.  I did print off some information for the patient in this regard. Subsequently I am also can see about starting him on some steroid cream as well as an oral steroid. 03/30/2021 upon evaluation today patient appears to be doing significantly better in regard to his wound. He has started using the clobetasol which seems to have made a excellent improvement in regard to the wound today. 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 Either way at this point I would recommend that we avoid sharp debridements I think that is probably can be the best option based on what I am seeing currently. The patient voiced understanding. He also had questions about taking the prednisone with regard to his immune system. I explained that I do believe that that can be an issue long-term but for short-term which is mainly what we are doing here I do not think it is going to be a major issue for him and in fact I think would help more especially considering the pyoderma here and the fact that is more of an [name], [name] ([id number]) autoimmune type issue than not using the prednisone. 04/06/2021 upon evaluation today patient's wound actually showed signs of excellent  improvement. This definitely has gone a lot better since we started with the topical steroids and overall I am extremely pleased with where things stand. No fevers, chills, nausea, vomiting, or diarrhea. With that being said the patient continues to feel much better and states that the pain is actually dramatically improved as well. 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 04/13/2021 upon evaluation today patient appears to be doing well with regard to his wound. Fortunately there does not appear to be any signs of active infection at this time. No fevers, chills, nausea, vomiting, or diarrhea. With that being said I do believe that he is overall doing extremely well. This is measuring smaller and does seem to be headed in the right direction. 04/20/2021 upon evaluation today patient appears to be doing well with regard to his wound. He still has quite a bit of slough covering the surface of the wound. With that being said we previously debrided the wound he actually had a bit of worsening and therefore I am somewhat reluctant to proceed with any further debridement at this point. In fact this got quite a bit larger postdebridement. 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 Nonetheless I think it is can to take time to get this to heal. 04/27/2021 upon evaluation today patient appears to be doing well with regard to his wound. This is measuring smaller this week yet again and overall I am extremely pleased with where we stand currently. Fortunately there does not appear to be any evidence of active infection which is great news. No fevers, chills, nausea, vomiting, or diarrhea. 05/04/2021 upon evaluation today patient's wound actually has a significant amount of biofilm noted on the surface of the wound. Fortunately there does not appear to be any evidence of active infection at this time which is great news and overall I am extremely pleased with where we stand today. However I do  think we may need to try to see about a sharp debridement to clear away some of the slough and biofilm on the surface of the wound to try to help this  to heal more effectively and quickly. Patient voiced understanding. 05/11/2021 upon evaluation today patient appears to be doing better in regard to his wound. He has been tolerating the dressing changes without complication. Fortunately there does not appear to be any signs of active infection at this time which is great news. No fever chills noted 05/25/2021 upon evaluation today patient's wound is actually showing signs of excellent improvement. Fortunately there does not appear to be any evidence of infection at this time which is great news and overall I am extremely pleased with where we stand currently. I do feel like that the patient is improving although is very slow where little by little seen in this improvement. 06/01/2021 upon evaluation today patient appears to be doing well with regard to his wound. She has been tolerating the dressing changes without complication. With that being said it somewhat slow to heal at this point. I do think we could try to switch things up a little bit and see about using collagen in place that what we have been doing. The patient is in agreement with that plan. Objective Constitutional Well-nourished and well-hydrated in no acute distress. Vitals Time Taken: 9:14 AM, Height: 78 in, Weight: 249 lbs, BMI: 28.8, Temperature: 98.3 F, Pulse: 61 bpm, Respiratory Rate: 18 breaths/min, Blood Pressure: 130/81 mmHg. Respiratory normal breathing without difficulty. Psychiatric this patient is able to make decisions and demonstrates good insight into disease process. Alert and Oriented x 3. pleasant and cooperative. General Notes: Upon inspection patient's wound bed actually showed signs of good granulation epithelization at this point. Fortunately there does not appear to be any evidence of active infection which is  great news and overall very pleased with where things stand today. Overall I am happy but I do think we could try to help with stimulating some additional growth here we have tried collagen and will show the patient how to do this currently. Integumentary (Hair, Skin) Wound #1 status is Open. Original cause of wound was Gradually Appeared. The date acquired was: 11/24/2020. The wound has been in treatment 20 weeks. The wound is located on the Right,Medial Lower Leg. The wound measures 0.5cm length x 0.7cm width x 0.2cm depth; 0.275cm^2 area and 0.055cm^3 volume. There is Fat Layer (Subcutaneous Tissue) exposed. There is no tunneling or undermining noted. There is a medium amount of serosanguineous drainage noted. The wound margin is flat and intact. There is medium (34-66%) pink granulation within the wound bed. There is a medium (34-66%) amount of necrotic tissue within the wound bed including Adherent Slough. Assessment FOWLER, ANTOS L. ([id number]) Active Problems ICD-10 Venous insufficiency (chronic) (peripheral) Non-pressure chronic ulcer of other part of right lower leg with fat layer exposed Plan 1. Would recommend currently that we going continue with the wound care measures as before the patient is in agreement the plan this includes the use of the silver collagen therapy a switch from the [facility] just to get this a little bit of a mixup and see how things do. 2. I am also can recommend that we continue with the clobetasol ointment although he should be using this just in the wound bed itself at this point. 3. I would also suggest that he continue with his compression socks I think they are doing a good job. We will see where things stand next week. Electronic Signature(s) Signed: 06/01/2021 9:38:03 AM By: Worthy Keeler PA-C Entered By: Worthy Keeler on 06/01/2021 09:38:03 [name], [name]  ([id number]) -------------------------------------------------------------------------------- SuperBill Details  Patient Name: DENNIES, COATE. Date of Service: 06/01/2021 Medical Record Number: [id number] Patient Account Number: [id number] Date of Birth/Sex: 1971/04/12 (50 y.o. M) Treating RN: Dolan Amen Primary Care Provider: PATIENT, NO Other Clinician: Referring Provider: Barkley Boards Treating Provider/Extender: Skipper Cliche in Treatment: 20 Diagnosis Coding ICD-10 Codes Code Description I87.2 Venous insufficiency (chronic) (peripheral) L97.812 Non-pressure chronic ulcer of other part of right lower leg with fat layer exposed Facility Procedures CPT4 Code: 38466599 Description: [place] [facility] VISIT-LEV 3 EST PT Modifier: Quantity: 1 Physician Procedures CPT4 Code: 3570177 Description: 93903 - WC PHYS LEVEL 4 - EST PT Modifier: Quantity: 1 CPT4 Code: Description: ICD-10 Diagnosis Description I87.2 Venous insufficiency (chronic) (peripheral) L97.812 Non-pressure chronic ulcer of other part of right lower leg with fat la Modifier: yer exposed Quantity: Electronic Signature(s) Signed: 06/01/2021 4:44:56 PM By: Dolan Amen RN Signed: 06/01/2021 6:01:56 PM By: Worthy Keeler PA-C Previous Signature: 06/01/2021 9:38:15 AM Version By: Worthy Keeler PA-C Entered By: Dolan Amen on 06/01/2021 09:43:06

## 2021-06-01 NOTE — Progress Notes (Signed)
JERMANY, RIMEL ([id number]) Visit Report for 06/01/2021 Arrival Information Details Patient Name: [name], [name]. Date of Service: 06/01/2021 9:00 AM Medical Record Number: [id number] Patient Account Number: [id number] Date of Birth/Sex: [birth year] (50 y.o. M) Treating RN: Dolan Amen Primary Care Jabarie Pop: PATIENT, NO Other Clinician: Referring Kelsy Polack: Barkley Boards Treating Jacara Benito/Extender: Skipper Cliche in Treatment: 20 Visit Information History Since Last Visit Pain Present Now: No Patient Arrived: Ambulatory Arrival Time: 09:13 Accompanied By: self Transfer Assistance: None Patient Identification Verified: Yes Secondary Verification Process Completed: Yes Patient Requires Transmission-Based Precautions: No Patient Has Alerts: Yes Patient Alerts: NOT DIABETIC Electronic Signature(s) Signed: 06/01/2021 4:44:56 PM By: Dolan Amen RN Entered By: Dolan Amen on 06/01/2021 09:13:41 [place], [place] ([id number]) -------------------------------------------------------------------------------- Clinic Level of Care Assessment Details Patient Name: Nicolas Cousin L. Date of Service: 06/01/2021 9:00 AM Medical Record Number: [id number] Patient Account Number: [id number] Date of Birth/Sex: [birth year] (50 y.o. M) Treating RN: Dolan Amen Primary Care Tyauna Lacaze: PATIENT, NO Other Clinician: Referring Jackey Housey: Barkley Boards Treating Zipporah Finamore/Extender: Skipper Cliche in Treatment: 20 Clinic Level of Care Assessment Items TOOL 4 Quantity Score X - Use when only an EandM is performed on FOLLOW-UP visit 1 0 ASSESSMENTS - Nursing Assessment / Reassessment X - Reassessment of Co-morbidities (includes updates in patient status) 1 10 X- 1 5 Reassessment of Adherence to Treatment Plan ASSESSMENTS - Wound and Skin Assessment / Reassessment X - Simple Wound Assessment / Reassessment - one wound 1 5 []  - 0 Complex Wound Assessment / Reassessment - multiple wounds []  -  0 Dermatologic / Skin Assessment (not related to wound area) ASSESSMENTS - Focused Assessment []  - Circumferential Edema Measurements - multi extremities 0 []  - 0 Nutritional Assessment / Counseling / Intervention []  - 0 Lower Extremity Assessment (monofilament, tuning fork, pulses) []  - 0 Peripheral Arterial Disease Assessment (using hand held doppler) ASSESSMENTS - Ostomy and/or Continence Assessment and Care []  - Incontinence Assessment and Management 0 []  - 0 Ostomy Care Assessment and Management (repouching, etc.) PROCESS - Coordination of Care X - Simple Patient / Family Education for ongoing care 1 15 []  - 0 Complex (extensive) Patient / Family Education for ongoing care []  - 0 Staff obtains Programmer, systems, Records, Test Results / Process Orders []  - 0 Staff telephones HHA, Nursing Homes / Clarify orders / etc []  - 0 Routine Transfer to another Facility (non-emergent condition) []  - 0 Routine Hospital Admission (non-emergent condition) []  - 0 New Admissions / Biomedical engineer / Ordering NPWT, Apligraf, etc. []  - 0 Emergency Hospital Admission (emergent condition) X- 1 10 Simple Discharge Coordination []  - 0 Complex (extensive) Discharge Coordination PROCESS - Special Needs []  - Pediatric / Minor Patient Management 0 []  - 0 Isolation Patient Management []  - 0 Hearing / Language / Visual special needs []  - 0 Assessment of Community assistance (transportation, D/C planning, etc.) []  - 0 Additional assistance / Altered mentation []  - 0 Support Surface(s) Assessment (bed, cushion, seat, etc.) INTERVENTIONS - Wound Cleansing / Measurement Arabie, Eyal L. ([id number]) X- 1 5 Simple Wound Cleansing - one wound []  - 0 Complex Wound Cleansing - multiple wounds X- 1 5 Wound Imaging (photographs - any number of wounds) []  - 0 Wound Tracing (instead of photographs) X- 1 5 Simple Wound Measurement - one wound []  - 0 Complex Wound Measurement - multiple  wounds INTERVENTIONS - Wound Dressings []  - Small Wound Dressing one or multiple wounds 0 X- 1 15 Medium Wound Dressing one or multiple wounds []  - 0  Large Wound Dressing one or multiple wounds []  - 0 Application of Medications - topical []  - 0 Application of Medications - injection INTERVENTIONS - Miscellaneous []  - External ear exam 0 []  - 0 Specimen Collection (cultures, biopsies, blood, body fluids, etc.) []  - 0 Specimen(s) / Culture(s) sent or taken to Lab for analysis []  - 0 Patient Transfer (multiple staff / Civil Service fast streamer / Similar devices) []  - 0 Simple Staple / Suture removal (25 or less) []  - 0 Complex Staple / Suture removal (26 or more) []  - 0 Hypo / Hyperglycemic Management (close monitor of Blood Glucose) []  - 0 Ankle / Brachial Index (ABI) - do not check if billed separately X- 1 5 Vital Signs Has the patient been seen at the hospital within the last three years: Yes Total Score: 80 Level Of Care: New/Established - Level 3 Electronic Signature(s) Signed: 06/01/2021 4:44:56 PM By: Dolan Amen RN Entered By: Dolan Amen on 06/01/2021 09:42:58 Kading, [name] ([id number]) -------------------------------------------------------------------------------- Encounter Discharge Information Details Patient Name: Nicolas Cousin L. Date of Service: 06/01/2021 9:00 AM Medical Record Number: [id number] Patient Account Number: [id number] Date of Birth/Sex: 07-22-1971 (50 y.o. M) Treating RN: Dolan Amen Primary Care Kynadee Dam: PATIENT, NO Other Clinician: Referring Ahilyn Nell: Barkley Boards Treating Keagan Anthis/Extender: Skipper Cliche in Treatment: 20 Encounter Discharge Information Items Discharge Condition: Stable Ambulatory Status: Ambulatory Discharge Destination: Home Transportation: Private Auto Accompanied By: self Schedule Follow-up Appointment: Yes Clinical Summary of Care: Electronic Signature(s) Signed: 06/01/2021 4:44:56 PM By: Dolan Amen  RN Entered By: Dolan Amen on 06/01/2021 09:43:44 [name], Nicolas ([id number]) -------------------------------------------------------------------------------- Lower Extremity Assessment Details Patient Name: EWELL, BENASSI L. Date of Service: 06/01/2021 9:00 AM Medical Record Number: [id number] Patient Account Number: [id number] Date of Birth/Sex: June 25, 1971 (50 y.o. M) Treating RN: Dolan Amen Primary Care Felicity Penix: PATIENT, NO Other Clinician: Referring Vashti Bolanos: Barkley Boards Treating Shaterrica Territo/Extender: Skipper Cliche in Treatment: 20 Edema Assessment Assessed: [Left: No] [Right: Yes] Edema: [Left: N] [Right: o] Calf Left: Right: Point of Measurement: 40 cm From Medial Instep 34.5 cm Ankle Left: Right: Point of Measurement: 10 cm From Medial Instep 25 cm Vascular Assessment Pulses: Dorsalis Pedis Palpable: [Right:Yes] Electronic Signature(s) Signed: 06/01/2021 4:44:56 PM By: Dolan Amen RN Entered By: Dolan Amen on 06/01/2021 09:22:05 Tayloe, Snyder ([id number]) -------------------------------------------------------------------------------- Multi Wound Chart Details Patient Name: Nicolas Cousin L. Date of Service: 06/01/2021 9:00 AM Medical Record Number: [id number] Patient Account Number: [id number] Date of Birth/Sex: 1970-10-06 (50 y.o. M) Treating RN: Dolan Amen Primary Care Kaye Luoma: PATIENT, NO Other Clinician: Referring Dmiyah Liscano: Barkley Boards Treating Takia Runyon/Extender: Skipper Cliche in Treatment: 20 Vital Signs Height(in): 78 Pulse(bpm): 83 Weight(lbs): 249 Blood Pressure(mmHg): 130/81 Body Mass Index(BMI): 29 Temperature(F): 98.3 Respiratory Rate(breaths/min): 18 Photos: [N/A:N/A] Wound Location: Right, Medial Lower Leg N/A N/A Wounding Event: Gradually Appeared N/A N/A Primary Etiology: Venous Leg Ulcer N/A N/A Date Acquired: 11/24/2020 N/A N/A Weeks of Treatment: 20 N/A N/A Wound Status: Open N/A N/A Measurements L x W x D (cm)  0.5x0.7x0.2 N/A N/A Area (cm) : 0.275 N/A N/A Volume (cm) : 0.055 N/A N/A % Reduction in Area: 67.60% N/A N/A % Reduction in Volume: 67.60% N/A N/A Classification: Full Thickness Without Exposed N/A N/A Support Structures Exudate Amount: Medium N/A N/A Exudate Type: Serosanguineous N/A N/A Exudate Color: red, brown N/A N/A Wound Margin: Flat and Intact N/A N/A Granulation Amount: Medium (34-66%) N/A N/A Granulation Quality: Pink N/A N/A Necrotic Amount: Medium (34-66%) N/A N/A Exposed Structures: Fat Layer (Subcutaneous Tissue): N/A N/A Yes Fascia:  No Tendon: No Muscle: No Joint: No Bone: No Epithelialization: Large (67-100%) N/A N/A Treatment Notes Electronic Signature(s) Signed: 06/01/2021 4:44:56 PM By: Dolan Amen RN Entered By: Dolan Amen on 06/01/2021 09:32:13 [name] ([id number]) -------------------------------------------------------------------------------- [place] Details Patient Name: YAIR, DUSZA L. Date of Service: 06/01/2021 9:00 AM Medical Record Number: [id number] Patient Account Number: [id number] Date of Birth/Sex: 1971/05/07 (50 y.o. M) Treating RN: Dolan Amen Primary Care [place]: PATIENT, NO Other Clinician: Referring Eleyna Brugh: Barkley Boards Treating Abbas Beyene/Extender: Skipper Cliche in Treatment: 20 Active Inactive Electronic Signature(s) Signed: 06/01/2021 4:44:56 PM By: Dolan Amen RN Entered By: Dolan Amen on 06/01/2021 09:32:03 [name] ([id number]) -------------------------------------------------------------------------------- Pain Assessment Details Patient Name: LYNDALL, WINDT L. Date of Service: 06/01/2021 9:00 AM Medical Record Number: [id number] Patient Account Number: [id number] Date of Birth/Sex: 02-18-1971 (50 y.o. M) Treating RN: Dolan Amen Primary Care Jerren Flinchbaugh: PATIENT, NO Other Clinician: Referring Yancy Knoble: Barkley Boards Treating Marasia Newhall/Extender: Skipper Cliche in  Treatment: 20 Active Problems Location of Pain Severity and Description of Pain Patient Has Paino No Site Locations Pain Management and Medication Current Pain Management: Electronic Signature(s) Signed: 06/01/2021 4:44:56 PM By: Dolan Amen RN Entered By: Dolan Amen on 06/01/2021 09:15:53 [name] ([id number]) -------------------------------------------------------------------------------- Patient/Caregiver Education Details Patient Name: Nicolas Cousin L. Date of Service: 06/01/2021 9:00 AM Medical Record Number: [id number] Patient Account Number: [id number] Date of Birth/Gender: January 30, 1971 (50 y.o. M) Treating RN: Dolan Amen Primary Care Physician: PATIENT, NO Other Clinician: Referring Physician: Barkley Boards Treating Physician/Extender: Skipper Cliche in Treatment: 20 Education Assessment Education Provided To: Patient Education Topics Provided Wound/Skin Impairment: Methods: Explain/Verbal Responses: State content correctly Electronic Signature(s) Signed: 06/01/2021 4:44:56 PM By: Dolan Amen RN Entered By: Dolan Amen on 06/01/2021 09:43:14 [place], [place] ([id number]) -------------------------------------------------------------------------------- Wound Assessment Details Patient Name: ISAI, GOTTLIEB L. Date of Service: 06/01/2021 9:00 AM Medical Record Number: [id number] Patient Account Number: [id number] Date of Birth/Sex: 1971/02/22 (50 y.o. M) Treating RN: Dolan Amen Primary Care Jakobi [place]tford: PATIENT, NO Other Clinician: Referring Lolamae Voisin: Barkley Boards Treating Malekai Markwood/Extender: Skipper Cliche in Treatment: 20 Wound Status Wound Number: 1 Primary Etiology: Venous Leg Ulcer Wound Location: Right, Medial Lower Leg Wound Status: Open Wounding Event: Gradually Appeared Date Acquired: 11/24/2020 Weeks Of Treatment: 20 Clustered Wound: No Photos Wound Measurements Length: (cm) 0.5 Width: (cm) 0.7 Depth: (cm) 0.2 Area: (cm)  0.275 Volume: (cm) 0.055 % Reduction in Area: 67.6% % Reduction in Volume: 67.6% Epithelialization: Large (67-100%) Tunneling: No Undermining: No Wound Description Classification: Full Thickness Without Exposed Support Structu Wound Margin: Flat and Intact Exudate Amount: Medium Exudate Type: Serosanguineous Exudate Color: red, brown res Foul Odor After Cleansing: No Slough/Fibrino Yes Wound Bed Granulation Amount: Medium (34-66%) Exposed Structure Granulation Quality: Pink Fascia Exposed: No Necrotic Amount: Medium (34-66%) Fat Layer (Subcutaneous Tissue) Exposed: Yes Necrotic Quality: Adherent Slough Tendon Exposed: No Muscle Exposed: No Joint Exposed: No Bone Exposed: No Treatment Notes Wound #1 (Lower Leg) Wound Laterality: Right, Medial Cleanser Soap and Water Discharge Instruction: Gently cleanse wound with antibacterial soap, rinse and pat dry prior to dressing wounds Peri-Wound Care CLIF, SERIO L. ([id number]) Topical Clobetasol Propionate ointment 0.05%, 60 (g) tube Discharge Instruction: Apply over collagen Primary Dressing Prisma 4.34 (in) Discharge Instruction: Moisten w/normal saline or sterile water; Cover wound as directed. Do not remove from wound bed. Secondary Dressing Bordered Gauze Sterile-HBD 4x4 (in/in) Discharge Instruction: Cover wound with Bordered Guaze Sterile as directed Secured With Compression Wrap Compression Stockings Add-Ons Electronic Signature(s) Signed: 06/01/2021 4:44:56 PM By:  Dolan Amen RN Entered By: Dolan Amen on 06/01/2021 09:20:05 [name] ([id number]) -------------------------------------------------------------------------------- Vitals Details Patient Name: Nicolas Cousin L. Date of Service: 06/01/2021 9:00 AM Medical Record Number: [id number] Patient Account Number: [id number] Date of Birth/Sex: [birth year] (50 y.o. M) Treating RN: Dolan Amen Primary Care Licia Harl: PATIENT, NO Other  Clinician: Referring Valerian Jewel: Barkley Boards Treating Haroon Shatto/Extender: Skipper Cliche in Treatment: 20 Vital Signs Time Taken: 09:14 Temperature (F): 98.3 Height (in): 78 Pulse (bpm): 61 Weight (lbs): 249 Respiratory Rate (breaths/min): 18 Body Mass Index (BMI): 28.8 Blood Pressure (mmHg): 130/81 Reference Range: 80 - 120 mg / dl Electronic Signature(s) Signed: 06/01/2021 4:44:56 PM By: Dolan Amen RN Entered By: Dolan Amen on 06/01/2021 09:15:49

## 2021-06-08 ENCOUNTER — Other Ambulatory Visit: Payer: Self-pay

## 2021-06-08 ENCOUNTER — Encounter: Payer: Self-pay | Admitting: Physician Assistant

## 2021-06-08 NOTE — Progress Notes (Addendum)
MACLAIN, COHRON ([id number]) Visit Report for 06/08/2021 Chief Complaint Document Details Patient Name: JORDELL, OUTTEN. Date of Service: 06/08/2021 9:00 AM Medical Record Number: [id number] Patient Account Number: [id number] Date of Birth/Sex: 1971-02-06 (50 y.o. M) Treating RN: Dolan Amen Primary Care Provider: PATIENT, NO Other Clinician: Referring Provider: Barkley Boards Treating Provider/Extender: Skipper Cliche in Treatment: 21 Information Obtained from: Patient Chief Complaint Right LE Ulcer Electronic Signature(s) Signed: 06/08/2021 9:10:33 AM By: Worthy Keeler PA-C Entered By: Worthy Keeler on 06/08/2021 09:10:33 [place], [place] ([id number]) -------------------------------------------------------------------------------- HPI Details Patient Name: [name] Date of Service: 06/08/2021 9:00 AM Medical Record Number: [id number] Patient Account Number: [id number] Date of Birth/Sex: 08-19-1971 (50 y.o. M) Treating RN: Dolan Amen Primary Care Provider: PATIENT, NO Other Clinician: Referring Provider: Barkley Boards Treating Provider/Extender: Skipper Cliche in Treatment: 21 History of Present Illness HPI Description: 01/12/2021 upon evaluation today patient appears to be doing somewhat poorly in regard to a wound on his right medial lower leg. Currently he tells me that this has been going on since around the beginning of April. He has done a telehealth visit with a physician who initially gave him a triamcinolone cream along with Keflex for 5 days. Subsequently he ended up being seen at walk-in urgent care where they also given Keflex for 7 days that seem to be doing better for him. Overall he tells me that things have improved from the standpoint of redness. Fortunately there does not appear to be any signs of systemic infection to be honest right now even locally I do not see any evidence of infection right now. He does have 1 main wound with some scattering  areas that look like they may have been small ulcerations but again for the most part these have filled in. This does look almost to be more of a vasculitis type scenario based on what I am seeing. Patient does have chronic venous insufficiency but is not wearing any compression even though it sounds like he has been told before he should. 5/27; patient with small wounds on his right medial ankle. He has chronic venous insufficiency and stasis dermatitis a large open wound and several smaller areas all of which looks better than on presentation last week according to her nursing staff 01/26/2021 upon evaluation today patient appears to be doing well with regard to his wound. He is making good progress. With that being said there is some need currently for sharp debridement in regard to the wound there is some necrotic debris it is almost completely cleaned off but not completely. He is in agreement with that plan today. Fortunately there does not appear to be any evidence of active infection which is great and I am very pleased in that regard. He still wishes he did not have to wear the compression wrap. He did get his compression socks from [facility] therapy though they are in the 15 to 20 mmHg range they did not have any his length as he is a tall guy in the 20 to 30 mmHg range. 02/02/2021 upon evaluation today patient appears to be doing excellent in regard to his leg ulcer. He has been tolerating the dressing changes without complication. Fortunately there is no evidence of active infection at this time. No fevers, chills, nausea, vomiting, or diarrhea. 02/09/2021 upon evaluation today patient's wound actually showing signs of improvement. Fortunately there does not appear to be any evidence of active infection which is great news overall very pleased with where  things stand today. 02/16/2021 upon evaluation today patient's wound is showing some signs of improvement. He did have a fairly aggressive  debridement last week he notes that he had quite a bit of discomfort he was some swelling following. With that being said I think this was to be expected considering what we had to do from a debridement standpoint. The good news is there does not appear to be any evidence of infection currently nonetheless I do believe that the patient may have some signs of vasculitis here. Based on the overall appearance of the wound today and beginning to suspect this. For that reason I think he would benefit from starting him on triamcinolone ointment to the wound bed and some of the immediate periwound to try to help out in this regard. 02/23/2021 upon evaluation today patient appears to be doing well with regard to his wound. He is actually making some progress here. The wound is roughly about the same size but the overall appearance of the wound bed is much improved. I do not see any signs of active infection at this time which is great news. No fevers, chills, nausea, vomiting, or diarrhea. 03/02/2021 upon evaluation today patient appears to be doing well with regard to his wound. I think the biggest issue I see right now is that with Hydrofera Blue this wound is staying somewhat dry. I think he could benefit from the use of collagen to try to help with getting this to stimulate some additional growth. He is in agreement with the plan. I think this also had a little bit more moisture which would be helpful at this point. 03/09/2021 on evaluation today patient appears to be doing about the same in regard to his wound is measuring a little bit smaller but still he does have the surface of the wound which is not quite as healthy as I would like to see. Fortunately there does not appear to be any active infection at this time. No fevers, chills, nausea, vomiting, or diarrhea. 03/16/2021 upon evaluation today patient appears to be doing better with regard to the overall appearance of his wound bed. There does not appear  to be any signs of significant slough buildup I think the Iodoflex has done very well for him. The patient's happy to hear this and see the improvement. With that being said he is continuing to have a little bit of expansion of the wound again I think this is more related to #1 edema control and #2 the fact that again we will get the wound bed clean. I feel like were pretty much today as far as cleaning up the wound bed is concerned now we just need to make sure the edema is optimized. I am just not certain that his compression socks are doing the job. 03/23/2021 upon evaluation today patient unfortunately appears to be doing a little bit worse with regard to his wound I was actually expecting this to be much better this week. With that being said I am getting concerned about the possibility of pyoderma gangrenosum. I did print off some information for the patient in this regard. Subsequently I am also can see about starting him on some steroid cream as well as an oral steroid. 03/30/2021 upon evaluation today patient appears to be doing significantly better in regard to his wound. He has started using the clobetasol which seems to have made a excellent improvement in regard to the wound today. 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 Either way at this point I would recommend that we avoid sharp debridements I think that is probably can be the best option based on what I am seeing currently. The patient voiced understanding. He also had questions about taking the prednisone with regard to his immune system. I explained that I do believe that that can be an issue long-term but for short-term which is mainly what we are doing here I do not think it is going to be a major issue for him and in fact I think would help more especially  considering the pyoderma here and the fact that is more of an autoimmune type issue than not using the prednisone. 04/06/2021 upon evaluation today patient's wound actually showed signs of excellent improvement. This definitely has gone a lot better since we started with the topical steroids and overall I am extremely pleased with where things stand. No fevers, chills, nausea, vomiting, or diarrhea. With that being said the patient continues to feel much better and states that the pain is actually dramatically improved as well. This definitely appears [place], [place] ([id number]) to be a pyoderma situation. 04/13/2021 upon evaluation today patient appears to be doing well with regard to his wound. Fortunately there does not appear to be any signs of active infection at this time. No fevers, chills, nausea, vomiting, or diarrhea. With that being said I do believe that he is overall doing extremely well. This is measuring smaller and does seem to be headed in the right direction. 04/20/2021 upon evaluation today patient appears to be doing well with regard to his wound. He still has quite a bit of slough covering the surface of the wound. With that being said we previously debrided the wound he actually had a bit of worsening and therefore I am somewhat reluctant to proceed with any further debridement at this point. In fact this got quite a bit larger postdebridement. 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 Nonetheless I think it is can to take time to get this to heal. 04/27/2021 upon evaluation today patient appears to be doing well with regard to his wound. This is measuring smaller this week yet again and overall I am extremely pleased with where we stand currently. Fortunately there does not appear to be any evidence of active infection which is great news. No fevers, chills, nausea, vomiting, or diarrhea. 05/04/2021 upon evaluation today patient's wound actually has a significant amount  of biofilm noted on the surface of the wound. Fortunately there does not appear to be any evidence of active infection at this time which is great news and overall I am extremely pleased with where we stand today. However I do think we may need to try to see about a sharp debridement to clear away some of the slough and biofilm on the surface of the wound to try to help this to heal more effectively and quickly. Patient voiced understanding. 05/11/2021 upon evaluation today patient appears to be doing better in regard to his wound. He has been tolerating the dressing changes without complication. Fortunately there does not appear to be any signs of active infection at this time which is great news. No fever chills noted 05/25/2021 upon evaluation today patient's wound is actually showing signs of excellent improvement. Fortunately there does not appear to  be any evidence of infection at this time which is great news and overall I am extremely pleased with where we stand currently. I do feel like that the patient is improving although is very slow where little by little seen in this improvement. 06/01/2021 upon evaluation today patient appears to be doing well with regard to his wound. She has been tolerating the dressing changes without complication. With that being said it somewhat slow to heal at this point. I do think we could try to switch things up a little bit and see about using collagen in place that what we have been doing. The patient is in agreement with that plan. 06/08/2021 upon evaluation today patient appears to be doing well with regard to his wound I do feel like the collagen is helping with a little additional granulation. Fortunately there does not appear to be any signs of infection which is great news. No fevers, chills, nausea, vomiting, or diarrhea. Electronic Signature(s) Signed: 06/08/2021 10:19:12 AM By: Worthy Keeler PA-C Entered By: Worthy Keeler on 06/08/2021  10:19:10 [name] ([id number]) -------------------------------------------------------------------------------- Physical Exam Details Patient Name: CAINE, BARFIELD L. Date of Service: 06/08/2021 9:00 AM Medical Record Number: [id number] Patient Account Number: [id number] Date of Birth/Sex: 10-Apr-1971 (50 y.o. M) Treating RN: Dolan Amen Primary Care Provider: PATIENT, NO Other Clinician: Referring Provider: Barkley Boards Treating Provider/Extender: Skipper Cliche in Treatment: 21 Constitutional Well-nourished and well-hydrated in no acute distress. Respiratory normal breathing without difficulty. Psychiatric this patient is able to make decisions and demonstrates good insight into disease process. Alert and Oriented x 3. pleasant and cooperative. Notes I do believe that the collagen seems to be making some additional progress here for the patient and I am very pleased in that regard. I do not see any evidence of infection which is great news and overall very pleased in that regard as well I do think we need to as much as possible try to start limiting the amount of clobetasol that he ends up having to use overall. We are just doing a very small amount currently over top of the collagen in the center of the wound region which is definitely a backoff from where we were previous and I think this is doing a great job for him. Electronic Signature(s) Signed: 06/08/2021 10:19:52 AM By: Worthy Keeler PA-C Entered By: Worthy Keeler on 06/08/2021 10:19:51 [name] ([id number]) -------------------------------------------------------------------------------- Physician Orders Details Patient Name: TERENCE, BART L. Date of Service: 06/08/2021 9:00 AM Medical Record Number: [id number] Patient Account Number: [id number] Date of Birth/Sex: 05/05/1971 (50 y.o. M) Treating RN: Dolan Amen Primary Care Provider: PATIENT, NO Other Clinician: Referring Provider: Barkley Boards Treating Provider/Extender: Skipper Cliche in Treatment: 21 Verbal / Phone Orders: No Diagnosis Coding ICD-10 Coding Code Description I87.2 Venous insufficiency (chronic) (peripheral) L97.812 Non-pressure chronic ulcer of other part of right lower leg with fat layer exposed Follow-up Appointments o Return Appointment in 2 weeks. Bathing/ Shower/ Hygiene o May shower; gently cleanse wound with antibacterial soap, rinse and pat dry prior to dressing wounds Edema Control - Lymphedema / Segmental Compressive Device / Other Bilateral Lower Extremities o Patient to wear own compression stockings. Remove compression stockings every night before going to bed and put on every morning when getting up. o Elevate, Exercise Daily and Avoid Standing for Long Periods of Time. o Elevate legs to the level of the heart and pump ankles as often as possible o Elevate leg(s) parallel to the floor  when sitting. Wound Treatment Wound #1 - Lower Leg Wound Laterality: Right, Medial Cleanser: Soap and Water Every Other Day/30 Days Discharge Instructions: Gently cleanse wound with antibacterial soap, rinse and pat dry prior to dressing wounds Topical: Clobetasol Propionate ointment 0.05%, 60 (g) tube Every Other Day/30 Days Discharge Instructions: Apply over collagen Primary Dressing: Prisma 4.34 (in) Every Other Day/30 Days Discharge Instructions: Moisten w/normal saline or sterile water; Cover wound as directed. Do not remove from wound bed. Secondary Dressing: [place] Dressing, 4x4 (in/in) Every Other Day/30 Days Discharge Instructions: Apply over dressing to secure in place. Electronic Signature(s) Signed: 06/08/2021 4:04:33 PM By: Dolan Amen RN Signed: 06/08/2021 4:11:02 PM By: Worthy Keeler PA-C Entered By: Dolan Amen on 06/08/2021 [place], [place] LMarland Kitchen ([id number]) -------------------------------------------------------------------------------- Problem  List Details Patient Name: FARRELL, PANTALEO L. Date of Service: 06/08/2021 9:00 AM Medical Record Number: [id number] Patient Account Number: [id number] Date of Birth/Sex: 1970-09-30 (50 y.o. M) Treating RN: Dolan Amen Primary Care Provider: PATIENT, NO Other Clinician: Referring Provider: Barkley Boards Treating Provider/Extender: Skipper Cliche in Treatment: 21 Active Problems ICD-10 Encounter Code Description Active Date MDM Diagnosis I87.2 Venous insufficiency (chronic) (peripheral) 01/12/2021 No Yes L97.812 Non-pressure chronic ulcer of other part of right lower leg with fat layer 01/12/2021 No Yes exposed Inactive Problems Resolved Problems Electronic Signature(s) Signed: 06/08/2021 9:10:24 AM By: Worthy Keeler PA-C Entered By: Worthy Keeler on 06/08/2021 09:10:24 [place], [place] ([id number]) -------------------------------------------------------------------------------- Progress Note Details Patient Name: [name] Date of Service: 06/08/2021 9:00 AM Medical Record Number: [id number] Patient Account Number: [id number] Date of Birth/Sex: 1971/02/19 (50 y.o. M) Treating RN: Dolan Amen Primary Care Provider: PATIENT, NO Other Clinician: Referring Provider: Barkley Boards Treating Provider/Extender: Skipper Cliche in Treatment: 21 Subjective Chief Complaint Information obtained from Patient Right LE Ulcer History of Present Illness (HPI) 01/12/2021 upon evaluation today patient appears to be doing somewhat poorly in regard to a wound on his right medial lower leg. Currently he tells me that this has been going on since around the beginning of April. He has done a telehealth visit with a physician who initially gave him a triamcinolone cream along with Keflex for 5 days. Subsequently he ended up being seen at walk-in urgent care where they also given Keflex for 7 days that seem to be doing better for him. Overall he tells me that things have improved from the  standpoint of redness. Fortunately there does not appear to be any signs of systemic infection to be honest right now even locally I do not see any evidence of infection right now. He does have 1 main wound with some scattering areas that look like they may have been small ulcerations but again for the most part these have filled in. This does look almost to be more of a vasculitis type scenario based on what I am seeing. Patient does have chronic venous insufficiency but is not wearing any compression even though it sounds like he has been told before he should. 5/27; patient with small wounds on his right medial ankle. He has chronic venous insufficiency and stasis dermatitis a large open wound and several smaller areas all of which looks better than on presentation last week according to her nursing staff 01/26/2021 upon evaluation today patient appears to be doing well with regard to his wound. He is making good progress. With that being said there is some need currently for sharp debridement in regard to the wound there is some necrotic debris  it is almost completely cleaned off but not completely. He is in agreement with that plan today. Fortunately there does not appear to be any evidence of active infection which is great and I am very pleased in that regard. He still wishes he did not have to wear the compression wrap. He did get his compression socks from elastic therapy though they are in the 15 to 20 mmHg range they did not have any his length as he is a tall guy in the 20 to 30 mmHg range. 02/02/2021 upon evaluation today patient appears to be doing excellent in regard to his leg ulcer. He has been tolerating the dressing changes without complication. Fortunately there is no evidence of active infection at this time. No fevers, chills, nausea, vomiting, or diarrhea. 02/09/2021 upon evaluation today patient's wound actually showing signs of improvement. Fortunately there does not appear to be  any evidence of active infection which is great news overall very pleased with where things stand today. 02/16/2021 upon evaluation today patient's wound is showing some signs of improvement. He did have a fairly aggressive debridement last week he notes that he had quite a bit of discomfort he was some swelling following. With that being said I think this was to be expected considering what we had to do from a debridement standpoint. The good news is there does not appear to be any evidence of infection currently nonetheless I do believe that the patient may have some signs of vasculitis here. Based on the overall appearance of the wound today and beginning to suspect this. For that reason I think he would benefit from starting him on triamcinolone ointment to the wound bed and some of the immediate periwound to try to help out in this regard. 02/23/2021 upon evaluation today patient appears to be doing well with regard to his wound. He is actually making some progress here. The wound is roughly about the same size but the overall appearance of the wound bed is much improved. I do not see any signs of active infection at this time which is great news. No fevers, chills, nausea, vomiting, or diarrhea. 03/02/2021 upon evaluation today patient appears to be doing well with regard to his wound. I think the biggest issue I see right now is that with Hydrofera Blue this wound is staying somewhat dry. I think he could benefit from the use of collagen to try to help with getting this to stimulate some additional growth. He is in agreement with the plan. I think this also had a little bit more moisture which would be helpful at this point. 03/09/2021 on evaluation today patient appears to be doing about the same in regard to his wound is measuring a little bit smaller but still he does have the surface of the wound which is not quite as healthy as I would like to see. Fortunately there does not appear to be any  active infection at this time. No fevers, chills, nausea, vomiting, or diarrhea. 03/16/2021 upon evaluation today patient appears to be doing better with regard to the overall appearance of his wound bed. There does not appear to be any signs of significant slough buildup I think the Iodoflex has done very well for him. The patient's happy to hear this and see the improvement. With that being said he is continuing to have a little bit of expansion of the wound again I think this is more related to #1 edema control and #2 the fact that again we  will get the wound bed clean. I feel like were pretty much today as far as cleaning up the wound bed is concerned now we just need to make sure the edema is optimized. I am just not certain that his compression socks are doing the job. 03/23/2021 upon evaluation today patient unfortunately appears to be doing a little bit worse with regard to his wound I was actually expecting this to be much better this week. With that being said I am getting concerned about the possibility of pyoderma gangrenosum. I did print off some information for the patient in this regard. Subsequently I am also can see about starting him on some steroid cream as well as an oral steroid. 03/30/2021 upon evaluation today patient appears to be doing significantly better in regard to his wound. He has started using the clobetasol which seems to have made a excellent improvement in regard to the wound today. 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 Either way at this point I would recommend that we avoid sharp debridements I think that is probably can be the best option based on what I am seeing currently. The patient voiced understanding. He also had questions about taking the prednisone with regard to his immune system. I  explained that I do believe that that can be an issue long-term but for short-term which is mainly what we are doing here I do not think it is going to be a major issue for him and in fact I think would help more especially considering the pyoderma here and the fact that is more of an [place], [place] ([id number]) autoimmune type issue than not using the prednisone. 04/06/2021 upon evaluation today patient's wound actually showed signs of excellent improvement. This definitely has gone a lot better since we started with the topical steroids and overall I am extremely pleased with where things stand. No fevers, chills, nausea, vomiting, or diarrhea. With that being said the patient continues to feel much better and states that the pain is actually dramatically improved as well. 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 04/13/2021 upon evaluation today patient appears to be doing well with regard to his wound. Fortunately there does not appear to be any signs of active infection at this time. No fevers, chills, nausea, vomiting, or diarrhea. With that being said I do believe that he is overall doing extremely well. This is measuring smaller and does seem to be headed in the right direction. 04/20/2021 upon evaluation today patient appears to be doing well with regard to his wound. He still has quite a bit of slough covering the surface of the wound. With that being said we previously debrided the wound he actually had a bit of worsening and therefore I am somewhat reluctant to proceed with any further debridement at this point. In fact this got quite a bit larger postdebridement. 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 Nonetheless I think it is can to take time to get this to heal. 04/27/2021 upon evaluation today patient appears to be doing well with regard to his wound. This is measuring smaller this week yet again and overall I am extremely pleased with where we stand currently.  Fortunately there does not appear to be any evidence of active infection which is great news. No  fevers, chills, nausea, vomiting, or diarrhea. 05/04/2021 upon evaluation today patient's wound actually has a significant amount of biofilm noted on the surface of the wound. Fortunately there does not appear to be any evidence of active infection at this time which is great news and overall I am extremely pleased with where we stand today. However I do think we may need to try to see about a sharp debridement to clear away some of the slough and biofilm on the surface of the wound to try to help this to heal more effectively and quickly. Patient voiced understanding. 05/11/2021 upon evaluation today patient appears to be doing better in regard to his wound. He has been tolerating the dressing changes without complication. Fortunately there does not appear to be any signs of active infection at this time which is great news. No fever chills noted 05/25/2021 upon evaluation today patient's wound is actually showing signs of excellent improvement. Fortunately there does not appear to be any evidence of infection at this time which is great news and overall I am extremely pleased with where we stand currently. I do feel like that the patient is improving although is very slow where little by little seen in this improvement. 06/01/2021 upon evaluation today patient appears to be doing well with regard to his wound. She has been tolerating the dressing changes without complication. With that being said it somewhat slow to heal at this point. I do think we could try to switch things up a little bit and see about using collagen in place that what we have been doing. The patient is in agreement with that plan. 06/08/2021 upon evaluation today patient appears to be doing well with regard to his wound I do feel like the collagen is helping with a little additional granulation. Fortunately there does not appear to be any  signs of infection which is great news. No fevers, chills, nausea, vomiting, or diarrhea. Objective Constitutional Well-nourished and well-hydrated in no acute distress. Vitals Time Taken: 9:22 AM, Height: 78 in, Weight: 249 lbs, BMI: 28.8, Temperature: 98.3 F, Pulse: 61 bpm, Respiratory Rate: 18 breaths/min, Blood Pressure: 132/84 mmHg. Respiratory normal breathing without difficulty. Psychiatric this patient is able to make decisions and demonstrates good insight into disease process. Alert and Oriented x 3. pleasant and cooperative. General Notes: I do believe that the collagen seems to be making some additional progress here for the patient and I am very pleased in that regard. I do not see any evidence of infection which is great news and overall very pleased in that regard as well I do think we need to as much as possible try to start limiting the amount of clobetasol that he ends up having to use overall. We are just doing a very small amount currently over top of the collagen in the center of the wound region which is definitely a backoff from where we were previous and I think this is doing a great job for him. Integumentary (Hair, Skin) Wound #1 status is Open. Original cause of wound was Gradually Appeared. The date acquired was: 11/24/2020. The wound has been in treatment 21 weeks. The wound is located on the Right,Medial Lower Leg. The wound measures 0.5cm length x 0.7cm width x 0.3cm depth; 0.275cm^2 area and 0.082cm^3 volume. There is Fat Layer (Subcutaneous Tissue) exposed. There is no tunneling or undermining noted. There is a medium amount of serosanguineous drainage noted. The wound margin is flat and intact. There is medium (34-66%) pink granulation  within the wound bed. There is a medium (34-66%) amount of necrotic tissue within the wound bed including Adherent Slough. SPEIGNER, Becket LMarland Kitchen ([id number]) Assessment Active Problems ICD-10 Venous insufficiency (chronic)  (peripheral) Non-pressure chronic ulcer of other part of right lower leg with fat layer exposed Plan Follow-up Appointments: Return Appointment in 2 weeks. Bathing/ Shower/ Hygiene: May shower; gently cleanse wound with antibacterial soap, rinse and pat dry prior to dressing wounds Edema Control - Lymphedema / Segmental Compressive Device / Other: Patient to wear own compression stockings. Remove compression stockings every night before going to bed and put on every morning when getting up. Elevate, Exercise Daily and Avoid Standing for Long Periods of Time. Elevate legs to the level of the heart and pump ankles as often as possible Elevate leg(s) parallel to the floor when sitting. WOUND #1: - Lower Leg Wound Laterality: Right, Medial Cleanser: Soap and Water Every Other Day/30 Days Discharge Instructions: Gently cleanse wound with antibacterial soap, rinse and pat dry prior to dressing wounds Topical: Clobetasol Propionate ointment 0.05%, 60 (g) tube Every Other Day/30 Days Discharge Instructions: Apply over collagen Primary Dressing: Prisma 4.34 (in) Every Other Day/30 Days Discharge Instructions: Moisten w/normal saline or sterile water; Cover wound as directed. Do not remove from wound bed. Secondary Dressing: [place] Dressing, 4x4 (in/in) Every Other Day/30 Days Discharge Instructions: Apply over dressing to secure in place. 1. Would recommend currently that we going continue with wound care measures as before and the patient is in agreement the plan this includes the use of the silver collagen followed by a little of the clobetasol and behind which I think is doing a good job. 2. I am also can recommend that we have the patient continue to monitor for any signs of worsening or infection right now I see neither and I think he is doing quite well but we will keep a close eye on the situation going forward. We will see patient back for reevaluation in 2 weeks here in the  clinic. If anything worsens or changes patient will contact our office for additional recommendations. Electronic Signature(s) Signed: 06/08/2021 10:20:33 AM By: Worthy Keeler PA-C Entered By: Worthy Keeler on 06/08/2021 10:20:33 [name] ([id number]) -------------------------------------------------------------------------------- SuperBill Details Patient Name: [name] Date of Service: 06/08/2021 Medical Record Number: [id number] Patient Account Number: [id number] Date of Birth/Sex: 1971/06/01 (51 y.o. M) Treating RN: Dolan Amen Primary Care Provider: PATIENT, NO Other Clinician: Referring Provider: Barkley Boards Treating Provider/Extender: Skipper Cliche in Treatment: 21 Diagnosis Coding ICD-10 Codes Code Description I87.2 Venous insufficiency (chronic) (peripheral) L97.812 Non-pressure chronic ulcer of other part of right lower leg with fat layer exposed Facility Procedures CPT4 Code: [id number] Description: [place] VISIT-LEV 3 EST PT Modifier: Quantity: 1 Physician Procedures CPT4 Code: [id number] Description: [id number] - WC PHYS LEVEL 4 - EST PT Modifier: Quantity: 1 CPT4 Code: Description: ICD-10 Diagnosis Description I87.2 Venous insufficiency (chronic) (peripheral) L97.812 Non-pressure chronic ulcer of other part of right lower leg with fat la Modifier: yer exposed Quantity: Electronic Signature(s) Signed: 06/08/2021 10:23:06 AM By: Worthy Keeler PA-C Entered By: Worthy Keeler on 06/08/2021 10:23:06

## 2021-06-15 ENCOUNTER — Encounter: Payer: Self-pay | Admitting: Physician Assistant

## 2021-06-15 ENCOUNTER — Other Ambulatory Visit: Payer: Self-pay

## 2021-06-15 NOTE — Progress Notes (Addendum)
TIRAN, SAUSEDA ([id number]) Visit Report for 06/15/2021 Chief Complaint Document Details Patient Name: [name], [name]. Date of Service: 06/15/2021 9:00 AM Medical Record Number: [id number] Patient Account Number: [id number] Date of Birth/Sex: 08/29/1970 (50 y.o. M) Treating RN: Dolan Amen Primary Care Provider: PATIENT, NO Other Clinician: Referring Provider: Barkley Boards Treating Provider/Extender: Skipper Cliche in Treatment: 22 Information Obtained from: Patient Chief Complaint Right LE Ulcer Electronic Signature(s) Signed: 06/15/2021 9:27:09 AM By: Worthy Keeler PA-C Entered By: Worthy Keeler on 06/15/2021 09:27:09 Devins, Zyire Carlean Jews ([id number]) -------------------------------------------------------------------------------- HPI Details Patient Name: Nicolas Cousin L. Date of Service: 06/15/2021 9:00 AM Medical Record Number: [id number] Patient Account Number: [id number] Date of Birth/Sex: [birth year] (50 y.o. M) Treating RN: Dolan Amen Primary Care Provider: PATIENT, NO Other Clinician: Referring Provider: Barkley Boards Treating Provider/Extender: Skipper Cliche in Treatment: 22 History of Present Illness HPI Description: 01/12/2021 upon evaluation today patient appears to be doing somewhat poorly in regard to a wound on his right medial lower leg. Currently he tells me that this has been going on since around the beginning of April. He has done a telehealth visit with a physician who initially gave him a triamcinolone cream along with Keflex for 5 days. Subsequently he ended up being seen at walk-in urgent care where they also given Keflex for 7 days that seem to be doing better for him. Overall he tells me that things have improved from the standpoint of redness. Fortunately there does not appear to be any signs of systemic infection to be honest right now even locally I do not see any evidence of infection right now. He does have 1 main wound with some scattering  areas that look like they may have been small ulcerations but again for the most part these have filled in. This does look almost to be more of a vasculitis type scenario based on what I am seeing. Patient does have chronic venous insufficiency but is not wearing any compression even though it sounds like he has been told before he should. 5/27; patient with small wounds on his right medial ankle. He has chronic venous insufficiency and stasis dermatitis a large open wound and several smaller areas all of which looks better than on presentation last week according to her nursing staff 01/26/2021 upon evaluation today patient appears to be doing well with regard to his wound. He is making good progress. With that being said there is some need currently for sharp debridement in regard to the wound there is some necrotic debris it is almost completely cleaned off but not completely. He is in agreement with that plan today. Fortunately there does not appear to be any evidence of active infection which is great and I am very pleased in that regard. He still wishes he did not have to wear the compression wrap. He did get his compression socks from [facility] therapy though they are in the 15 to 20 mmHg range they did not have any his length as he is a tall guy in the 20 to 30 mmHg range. 02/02/2021 upon evaluation today patient appears to be doing excellent in regard to his leg ulcer. He has been tolerating the dressing changes without complication. Fortunately there is no evidence of active infection at this time. No fevers, chills, nausea, vomiting, or diarrhea. 02/09/2021 upon evaluation today patient's wound actually showing signs of improvement. Fortunately there does not appear to be any evidence of active infection which is great news overall very pleased with where  things stand today. 02/16/2021 upon evaluation today patient's wound is showing some signs of improvement. He did have a fairly aggressive  debridement last week he notes that he had quite a bit of discomfort he was some swelling following. With that being said I think this was to be expected considering what we had to do from a debridement standpoint. The good news is there does not appear to be any evidence of infection currently nonetheless I do believe that the patient may have some signs of vasculitis here. Based on the overall appearance of the wound today and beginning to suspect this. For that reason I think he would benefit from starting him on triamcinolone ointment to the wound bed and some of the immediate periwound to try to help out in this regard. 02/23/2021 upon evaluation today patient appears to be doing well with regard to his wound. He is actually making some progress here. The wound is roughly about the same size but the overall appearance of the wound bed is much improved. I do not see any signs of active infection at this time which is great news. No fevers, chills, nausea, vomiting, or diarrhea. 03/02/2021 upon evaluation today patient appears to be doing well with regard to his wound. I think the biggest issue I see right now is that with Hydrofera Blue this wound is staying somewhat dry. I think he could benefit from the use of collagen to try to help with getting this to stimulate some additional growth. He is in agreement with the plan. I think this also had a little bit more moisture which would be helpful at this point. 03/09/2021 on evaluation today patient appears to be doing about the same in regard to his wound is measuring a little bit smaller but still he does have the surface of the wound which is not quite as healthy as I would like to see. Fortunately there does not appear to be any active infection at this time. No fevers, chills, nausea, vomiting, or diarrhea. 03/16/2021 upon evaluation today patient appears to be doing better with regard to the overall appearance of his wound bed. There does not appear  to be any signs of significant slough buildup I think the Iodoflex has done very well for him. The patient's happy to hear this and see the improvement. With that being said he is continuing to have a little bit of expansion of the wound again I think this is more related to #1 edema control and #2 the fact that again we will get the wound bed clean. I feel like were pretty much today as far as cleaning up the wound bed is concerned now we just need to make sure the edema is optimized. I am just not certain that his compression socks are doing the job. 03/23/2021 upon evaluation today patient unfortunately appears to be doing a little bit worse with regard to his wound I was actually expecting this to be much better this week. With that being said I am getting concerned about the possibility of pyoderma gangrenosum. I did print off some information for the patient in this regard. Subsequently I am also can see about starting him on some steroid cream as well as an oral steroid. 03/30/2021 upon evaluation today patient appears to be doing significantly better in regard to his wound. He has started using the clobetasol which seems to have made a excellent improvement in regard to the wound today. 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 Either way at this point I would recommend that we avoid sharp debridements I think that is probably can be the best option based on what I am seeing currently. The patient voiced understanding. He also had questions about taking the prednisone with regard to his immune system. I explained that I do believe that that can be an issue long-term but for short-term which is mainly what we are doing here I do not think it is going to be a major issue for him and in fact I think would help more especially  considering the pyoderma here and the fact that is more of an autoimmune type issue than not using the prednisone. 04/06/2021 upon evaluation today patient's wound actually showed signs of excellent improvement. This definitely has gone a lot better since we started with the topical steroids and overall I am extremely pleased with where things stand. No fevers, chills, nausea, vomiting, or diarrhea. With that being said the patient continues to feel much better and states that the pain is actually dramatically improved as well. This definitely appears Octavion, [place] ([id number]) to be a pyoderma situation. 04/13/2021 upon evaluation today patient appears to be doing well with regard to his wound. Fortunately there does not appear to be any signs of active infection at this time. No fevers, chills, nausea, vomiting, or diarrhea. With that being said I do believe that he is overall doing extremely well. This is measuring smaller and does seem to be headed in the right direction. 04/20/2021 upon evaluation today patient appears to be doing well with regard to his wound. He still has quite a bit of slough covering the surface of the wound. With that being said we previously debrided the wound he actually had a bit of worsening and therefore I am somewhat reluctant to proceed with any further debridement at this point. In fact this got quite a bit larger postdebridement. 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 Nonetheless I think it is can to take time to get this to heal. 04/27/2021 upon evaluation today patient appears to be doing well with regard to his wound. This is measuring smaller this week yet again and overall I am extremely pleased with where we stand currently. Fortunately there does not appear to be any evidence of active infection which is great news. No fevers, chills, nausea, vomiting, or diarrhea. 05/04/2021 upon evaluation today patient's wound actually has a significant amount  of biofilm noted on the surface of the wound. Fortunately there does not appear to be any evidence of active infection at this time which is great news and overall I am extremely pleased with where we stand today. However I do think we may need to try to see about a sharp debridement to clear away some of the slough and biofilm on the surface of the wound to try to help this to heal more effectively and quickly. Patient voiced understanding. 05/11/2021 upon evaluation today patient appears to be doing better in regard to his wound. He has been tolerating the dressing changes without complication. Fortunately there does not appear to be any signs of active infection at this time which is great news. No fever chills noted 05/25/2021 upon evaluation today patient's wound is actually showing signs of excellent improvement. Fortunately there does not appear to  be any evidence of infection at this time which is great news and overall I am extremely pleased with where we stand currently. I do feel like that the patient is improving although is very slow where little by little seen in this improvement. 06/01/2021 upon evaluation today patient appears to be doing well with regard to his wound. She has been tolerating the dressing changes without complication. With that being said it somewhat slow to heal at this point. I do think we could try to switch things up a little bit and see about using collagen in place that what we have been doing. The patient is in agreement with that plan. 06/08/2021 upon evaluation today patient appears to be doing well with regard to his wound I do feel like the collagen is helping with a little additional granulation. Fortunately there does not appear to be any signs of infection which is great news. No fevers, chills, nausea, vomiting, or diarrhea. 06/15/2021 upon evaluation today patient appears to be doing well with regard to his wound. In fact this is showing signs of  improvement is going slowly but nonetheless making good progress in my opinion. I do not see any signs of active infection at this time. No fevers, chills, nausea, vomiting, or diarrhea. Electronic Signature(s) Signed: 06/15/2021 10:32:55 AM By: Worthy Keeler PA-C Entered By: Worthy Keeler on 06/15/2021 10:32:55 [name] ([id number]) -------------------------------------------------------------------------------- Physical Exam Details Patient Name: SHILO, PAUWELS L. Date of Service: 06/15/2021 9:00 AM Medical Record Number: [id number] Patient Account Number: [id number] Date of Birth/Sex: 03-15-1971 (50 y.o. M) Treating RN: Dolan Amen Primary Care Provider: PATIENT, NO Other Clinician: Referring Provider: Barkley Boards Treating Provider/Extender: Skipper Cliche in Treatment: 72 Constitutional Well-nourished and well-hydrated in no acute distress. Respiratory normal breathing without difficulty. Psychiatric this patient is able to make decisions and demonstrates good insight into disease process. Alert and Oriented x 3. pleasant and cooperative. Notes Patient's wound bed showed signs of good granulation and actually pleased with where things stand I think he is making excellent progress. I do not see any signs of infection which is great and overall no debridement was performed today I do not think it is necessary and overall with this being a pyoderma situation and trying to avoid debridement if not absolutely necessary. Electronic Signature(s) Signed: 06/15/2021 10:33:31 AM By: Worthy Keeler PA-C Entered By: Worthy Keeler on 06/15/2021 10:33:30 [name] ([id number]) -------------------------------------------------------------------------------- Physician Orders Details Patient Name: LORRIN, NAWROT L. Date of Service: 06/15/2021 9:00 AM Medical Record Number: [id number] Patient Account Number: [id number] Date of Birth/Sex: Sep 04, 1970 (51 y.o. M) Treating RN:  Dolan Amen Primary Care Provider: PATIENT, NO Other Clinician: Referring Provider: Barkley Boards Treating Provider/Extender: Skipper Cliche in Treatment: 22 Verbal / Phone Orders: No Diagnosis Coding ICD-10 Coding Code Description I87.2 Venous insufficiency (chronic) (peripheral) L97.812 Non-pressure chronic ulcer of other part of right lower leg with fat layer exposed Follow-up Appointments o Return Appointment in 2 weeks. Bathing/ Shower/ Hygiene o May shower; gently cleanse wound with antibacterial soap, rinse and pat dry prior to dressing wounds Edema Control - Lymphedema / Segmental Compressive Device / Other Bilateral Lower Extremities o Patient to wear own compression stockings. Remove compression stockings every night before going to bed and put on every morning when getting up. o Elevate, Exercise Daily and Avoid Standing for Long Periods of Time. o Elevate legs to the level of the heart and pump ankles as often as possible o Elevate leg(s) parallel  to the floor when sitting. Wound Treatment Wound #1 - Lower Leg Wound Laterality: Right, Medial Cleanser: Soap and Water Every Other Day/30 Days Discharge Instructions: Gently cleanse wound with antibacterial soap, rinse and pat dry prior to dressing wounds Topical: Clobetasol Propionate ointment 0.05%, 60 (g) tube Every Other Day/30 Days Discharge Instructions: Apply over collagen Primary Dressing: Prisma 4.34 (in) Every Other Day/30 Days Discharge Instructions: Moisten w/normal saline or sterile water; Cover wound as directed. Do not remove from wound bed. Secondary Dressing: [place] Dressing, 4x4 (in/in) Every Other Day/30 Days Discharge Instructions: Apply over dressing to secure in place. Electronic Signature(s) Signed: 06/15/2021 4:28:58 PM By: Worthy Keeler PA-C Signed: 06/15/2021 4:52:43 PM By: Dolan Amen RN Entered By: Dolan Amen on 06/15/2021 10:01:04 [name]  ([id number]) -------------------------------------------------------------------------------- Problem List Details Patient Name: LINC, RENNE L. Date of Service: 06/15/2021 9:00 AM Medical Record Number: [id number] Patient Account Number: [id number] Date of Birth/Sex: 1971/03/26 (50 y.o. M) Treating RN: Dolan Amen Primary Care Provider: PATIENT, NO Other Clinician: Referring Provider: Barkley Boards Treating Provider/Extender: Skipper Cliche in Treatment: 22 Active Problems ICD-10 Encounter Code Description Active Date MDM Diagnosis I87.2 Venous insufficiency (chronic) (peripheral) 01/12/2021 No Yes L97.812 Non-pressure chronic ulcer of other part of right lower leg with fat layer 01/12/2021 No Yes exposed Inactive Problems Resolved Problems Electronic Signature(s) Signed: 06/15/2021 9:27:03 AM By: Worthy Keeler PA-C Entered By: Worthy Keeler on 06/15/2021 09:27:03 Goeden, Tarvaris L. ([id number]) -------------------------------------------------------------------------------- Progress Note Details Patient Name: Nicolas Cousin L. Date of Service: 06/15/2021 9:00 AM Medical Record Number: [id number] Patient Account Number: [id number] Date of Birth/Sex: 1970/09/30 (50 y.o. M) Treating RN: Dolan Amen Primary Care Provider: PATIENT, NO Other Clinician: Referring Provider: Barkley Boards Treating Provider/Extender: Skipper Cliche in Treatment: 22 Subjective Chief Complaint Information obtained from Patient Right LE Ulcer History of Present Illness (HPI) 01/12/2021 upon evaluation today patient appears to be doing somewhat poorly in regard to a wound on his right medial lower leg. Currently he tells me that this has been going on since around the beginning of April. He has done a telehealth visit with a physician who initially gave him a triamcinolone cream along with Keflex for 5 days. Subsequently he ended up being seen at walk-in urgent care where they also given Keflex for  7 days that seem to be doing better for him. Overall he tells me that things have improved from the standpoint of redness. Fortunately there does not appear to be any signs of systemic infection to be honest right now even locally I do not see any evidence of infection right now. He does have 1 main wound with some scattering areas that look like they may have been small ulcerations but again for the most part these have filled in. This does look almost to be more of a vasculitis type scenario based on what I am seeing. Patient does have chronic venous insufficiency but is not wearing any compression even though it sounds like he has been told before he should. 5/27; patient with small wounds on his right medial ankle. He has chronic venous insufficiency and stasis dermatitis a large open wound and several smaller areas all of which looks better than on presentation last week according to her nursing staff 01/26/2021 upon evaluation today patient appears to be doing well with regard to his wound. He is making good progress. With that being said there is some need currently for sharp debridement in regard to the wound there is  some necrotic debris it is almost completely cleaned off but not completely. He is in agreement with that plan today. Fortunately there does not appear to be any evidence of active infection which is great and I am very pleased in that regard. He still wishes he did not have to wear the compression wrap. He did get his compression socks from [facility] therapy though they are in the 15 to 20 mmHg range they did not have any his length as he is a tall guy in the 20 to 30 mmHg range. 02/02/2021 upon evaluation today patient appears to be doing excellent in regard to his leg ulcer. He has been tolerating the dressing changes without complication. Fortunately there is no evidence of active infection at this time. No fevers, chills, nausea, vomiting, or diarrhea. 02/09/2021 upon evaluation  today patient's wound actually showing signs of improvement. Fortunately there does not appear to be any evidence of active infection which is great news overall very pleased with where things stand today. 02/16/2021 upon evaluation today patient's wound is showing some signs of improvement. He did have a fairly aggressive debridement last week he notes that he had quite a bit of discomfort he was some swelling following. With that being said I think this was to be expected considering what we had to do from a debridement standpoint. The good news is there does not appear to be any evidence of infection currently nonetheless I do believe that the patient may have some signs of vasculitis here. Based on the overall appearance of the wound today and beginning to suspect this. For that reason I think he would benefit from starting him on triamcinolone ointment to the wound bed and some of the immediate periwound to try to help out in this regard. 02/23/2021 upon evaluation today patient appears to be doing well with regard to his wound. He is actually making some progress here. The wound is roughly about the same size but the overall appearance of the wound bed is much improved. I do not see any signs of active infection at this time which is great news. No fevers, chills, nausea, vomiting, or diarrhea. 03/02/2021 upon evaluation today patient appears to be doing well with regard to his wound. I think the biggest issue I see right now is that with Hydrofera Blue this wound is staying somewhat dry. I think he could benefit from the use of collagen to try to help with getting this to stimulate some additional growth. He is in agreement with the plan. I think this also had a little bit more moisture which would be helpful at this point. 03/09/2021 on evaluation today patient appears to be doing about the same in regard to his wound is measuring a little bit smaller but still he does have the surface of the wound  which is not quite as healthy as I would like to see. Fortunately there does not appear to be any active infection at this time. No fevers, chills, nausea, vomiting, or diarrhea. 03/16/2021 upon evaluation today patient appears to be doing better with regard to the overall appearance of his wound bed. There does not appear to be any signs of significant slough buildup I think the Iodoflex has done very well for him. The patient's happy to hear this and see the improvement. With that being said he is continuing to have a little bit of expansion of the wound again I think this is more related to #1 edema control and #2 the fact  that again we will get the wound bed clean. I feel like were pretty much today as far as cleaning up the wound bed is concerned now we just need to make sure the edema is optimized. I am just not certain that his compression socks are doing the job. 03/23/2021 upon evaluation today patient unfortunately appears to be doing a little bit worse with regard to his wound I was actually expecting this to be much better this week. With that being said I am getting concerned about the possibility of pyoderma gangrenosum. I did print off some information for the patient in this regard. Subsequently I am also can see about starting him on some steroid cream as well as an oral steroid. 03/30/2021 upon evaluation today patient appears to be doing significantly better in regard to his wound. He has started using the clobetasol which seems to have made a excellent improvement in regard to the wound today. 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 Either way at this point I would recommend that we avoid sharp debridements I think that is probably can be the best option based on what I am seeing currently. The patient voiced  understanding. He also had questions about taking the prednisone with regard to his immune system. I explained that I do believe that that can be an issue long-term but for short-term which is mainly what we are doing here I do not think it is going to be a major issue for him and in fact I think would help more especially considering the pyoderma here and the fact that is more of an [place] ([id number]) autoimmune type issue than not using the prednisone. 04/06/2021 upon evaluation today patient's wound actually showed signs of excellent improvement. This definitely has gone a lot better since we started with the topical steroids and overall I am extremely pleased with where things stand. No fevers, chills, nausea, vomiting, or diarrhea. With that being said the patient continues to feel much better and states that the pain is actually dramatically improved as well. 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 04/13/2021 upon evaluation today patient appears to be doing well with regard to his wound. Fortunately there does not appear to be any signs of active infection at this time. No fevers, chills, nausea, vomiting, or diarrhea. With that being said I do believe that he is overall doing extremely well. This is measuring smaller and does seem to be headed in the right direction. 04/20/2021 upon evaluation today patient appears to be doing well with regard to his wound. He still has quite a bit of slough covering the surface of the wound. With that being said we previously debrided the wound he actually had a bit of worsening and therefore I am somewhat reluctant to proceed with any further debridement at this point. In fact this got quite a bit larger postdebridement. 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 Nonetheless I think it is can to take time to get this to heal. 04/27/2021 upon evaluation today patient appears to be doing well with regard to his wound. This is  measuring smaller this week yet again and overall I am extremely pleased with where we stand currently. Fortunately there does not appear to be any evidence of active infection which is  great news. No fevers, chills, nausea, vomiting, or diarrhea. 05/04/2021 upon evaluation today patient's wound actually has a significant amount of biofilm noted on the surface of the wound. Fortunately there does not appear to be any evidence of active infection at this time which is great news and overall I am extremely pleased with where we stand today. However I do think we may need to try to see about a sharp debridement to clear away some of the slough and biofilm on the surface of the wound to try to help this to heal more effectively and quickly. Patient voiced understanding. 05/11/2021 upon evaluation today patient appears to be doing better in regard to his wound. He has been tolerating the dressing changes without complication. Fortunately there does not appear to be any signs of active infection at this time which is great news. No fever chills noted 05/25/2021 upon evaluation today patient's wound is actually showing signs of excellent improvement. Fortunately there does not appear to be any evidence of infection at this time which is great news and overall I am extremely pleased with where we stand currently. I do feel like that the patient is improving although is very slow where little by little seen in this improvement. 06/01/2021 upon evaluation today patient appears to be doing well with regard to his wound. She has been tolerating the dressing changes without complication. With that being said it somewhat slow to heal at this point. I do think we could try to switch things up a little bit and see about using collagen in place that what we have been doing. The patient is in agreement with that plan. 06/08/2021 upon evaluation today patient appears to be doing well with regard to his wound I do feel like  the collagen is helping with a little additional granulation. Fortunately there does not appear to be any signs of infection which is great news. No fevers, chills, nausea, vomiting, or diarrhea. 06/15/2021 upon evaluation today patient appears to be doing well with regard to his wound. In fact this is showing signs of improvement is going slowly but nonetheless making good progress in my opinion. I do not see any signs of active infection at this time. No fevers, chills, nausea, vomiting, or diarrhea. Objective Constitutional Well-nourished and well-hydrated in no acute distress. Vitals Time Taken: 9:16 AM, Height: 78 in, Weight: 249 lbs, BMI: 28.8, Temperature: 98.2 F, Pulse: 72 bpm, Respiratory Rate: 18 breaths/min, Blood Pressure: 128/82 mmHg. Respiratory normal breathing without difficulty. Psychiatric this patient is able to make decisions and demonstrates good insight into disease process. Alert and Oriented x 3. pleasant and cooperative. General Notes: Patient's wound bed showed signs of good granulation and actually pleased with where things stand I think he is making excellent progress. I do not see any signs of infection which is great and overall no debridement was performed today I do not think it is necessary and overall with this being a pyoderma situation and trying to avoid debridement if not absolutely necessary. Integumentary (Hair, Skin) Wound #1 status is Open. Original cause of wound was Gradually Appeared. The date acquired was: 11/24/2020. The wound has been in treatment 22 weeks. The wound is located on the Right,Medial Lower Leg. The wound measures 0.6cm length x 0.7cm width x 0.2cm depth; 0.33cm^2 area and 0.066cm^3 volume. There is Fat Layer (Subcutaneous Tissue) exposed. There is no tunneling or undermining noted. There is a medium amount of serosanguineous drainage noted. The wound margin is flat and intact.  There is medium (34-66%) pink granulation within the  wound [place] ([id number]) bed. There is a medium (34-66%) amount of necrotic tissue within the wound bed including Adherent Slough. Assessment Active Problems ICD-10 Venous insufficiency (chronic) (peripheral) Non-pressure chronic ulcer of other part of right lower leg with fat layer exposed Plan Follow-up Appointments: Return Appointment in 2 weeks. Bathing/ Shower/ Hygiene: May shower; gently cleanse wound with antibacterial soap, rinse and pat dry prior to dressing wounds Edema Control - Lymphedema / Segmental Compressive Device / Other: Patient to wear own compression stockings. Remove compression stockings every night before going to bed and put on every morning when getting up. Elevate, Exercise Daily and Avoid Standing for Long Periods of Time. Elevate legs to the level of the heart and pump ankles as often as possible Elevate leg(s) parallel to the floor when sitting. WOUND #1: - Lower Leg Wound Laterality: Right, Medial Cleanser: Soap and Water Every Other Day/30 Days Discharge Instructions: Gently cleanse wound with antibacterial soap, rinse and pat dry prior to dressing wounds Topical: Clobetasol Propionate ointment 0.05%, 60 (g) tube Every Other Day/30 Days Discharge Instructions: Apply over collagen Primary Dressing: Prisma 4.34 (in) Every Other Day/30 Days Discharge Instructions: Moisten w/normal saline or sterile water; Cover wound as directed. Do not remove from wound bed. Secondary Dressing: [place] Dressing, 4x4 (in/in) Every Other Day/30 Days Discharge Instructions: Apply over dressing to secure in place. 1. Would recommend currently that we going continue with the wound care measures as before and the patient is in agreement with the plan. This includes the use of the silver collagen to the wound bed followed by just a small amount of the clobetasol over top. 2. Also can recommend that we have the patient continue to clean this with each  dressing change I think it is okay from the shower that was discussed today. We will see patient back for reevaluation in 1 week here in the clinic. If anything worsens or changes patient will contact our office for additional recommendations. Electronic Signature(s) Signed: 06/15/2021 10:34:34 AM By: Worthy Keeler PA-C Entered By: Worthy Keeler on 06/15/2021 10:34:33 [place], Ferguson Carlean Jews ([id number]) -------------------------------------------------------------------------------- SuperBill Details Patient Name: Nicolas Cousin L. Date of Service: 06/15/2021 Medical Record Number: [id number] Patient Account Number: [id number] Date of Birth/Sex: [birth year] (50 y.o. M) Treating RN: Dolan Amen Primary Care Provider: PATIENT, NO Other Clinician: Referring Provider: Barkley Boards Treating Provider/Extender: Skipper Cliche in Treatment: 22 Diagnosis Coding ICD-10 Codes Code Description I87.2 Venous insufficiency (chronic) (peripheral) L97.812 Non-pressure chronic ulcer of other part of right lower leg with fat layer exposed Facility Procedures CPT4 Code: 78676720 Description: [facility] VISIT-LEV 3 EST PT Modifier: Quantity: 1 Physician Procedures CPT4 Code: 9470962 Description: 83662 - WC PHYS LEVEL 4 - EST PT Modifier: Quantity: 1 CPT4 Code: Description: ICD-10 Diagnosis Description I87.2 Venous insufficiency (chronic) (peripheral) L97.812 Non-pressure chronic ulcer of other part of right lower leg with fat la Modifier: yer exposed Quantity: Electronic Signature(s) Signed: 06/15/2021 10:35:08 AM By: Worthy Keeler PA-C Entered By: Worthy Keeler on 06/15/2021 10:35:08

## 2021-06-15 NOTE — Progress Notes (Signed)
HUNTER, BACHAR ([id number]) Visit Report for 06/15/2021 Arrival Information Details Patient Name: [name], [name]. Date of Service: 06/15/2021 9:00 AM Medical Record Number: [id number] Patient Account Number: [id number] Date of Birth/Sex: 04-09-1971 (50 y.o. M) Treating RN: Dolan Amen Primary Care Quron Ruddy: PATIENT, NO Other Clinician: Referring Dereona Kolodny: Barkley Boards Treating Franciso Dierks/Extender: Skipper Cliche in Treatment: 22 Visit Information History Since Last Visit Pain Present Now: No Patient Arrived: Ambulatory Arrival Time: 09:15 Accompanied By: self Transfer Assistance: None Patient Identification Verified: Yes Secondary Verification Process Completed: Yes Patient Requires Transmission-Based Precautions: No Patient Has Alerts: Yes Patient Alerts: NOT DIABETIC Electronic Signature(s) Signed: 06/15/2021 4:52:43 PM By: Dolan Amen RN Entered By: Dolan Amen on 06/15/2021 09:15:23 [name] ([id number]) -------------------------------------------------------------------------------- Clinic Level of Care Assessment Details Patient Name: Nicolas Cousin L. Date of Service: 06/15/2021 9:00 AM Medical Record Number: [id number] Patient Account Number: [id number] Date of Birth/Sex: December 12, 1970 (50 y.o. M) Treating RN: Dolan Amen Primary Care Kaelin Holford: PATIENT, NO Other Clinician: Referring Merland Holness: Barkley Boards Treating Shyasia Funches/Extender: Skipper Cliche in Treatment: 22 Clinic Level of Care Assessment Items TOOL 4 Quantity Score X - Use when only an EandM is performed on FOLLOW-UP visit 1 0 ASSESSMENTS - Nursing Assessment / Reassessment X - Reassessment of Co-morbidities (includes updates in patient status) 1 10 X- 1 5 Reassessment of Adherence to Treatment Plan ASSESSMENTS - Wound and Skin Assessment / Reassessment X - Simple Wound Assessment / Reassessment - one wound 1 5 []  - 0 Complex Wound Assessment / Reassessment - multiple wounds []  -  0 Dermatologic / Skin Assessment (not related to wound area) ASSESSMENTS - Focused Assessment []  - Circumferential Edema Measurements - multi extremities 0 []  - 0 Nutritional Assessment / Counseling / Intervention []  - 0 Lower Extremity Assessment (monofilament, tuning fork, pulses) []  - 0 Peripheral Arterial Disease Assessment (using hand held doppler) ASSESSMENTS - Ostomy and/or Continence Assessment and Care []  - Incontinence Assessment and Management 0 []  - 0 Ostomy Care Assessment and Management (repouching, etc.) PROCESS - Coordination of Care X - Simple Patient / Family Education for ongoing care 1 15 []  - 0 Complex (extensive) Patient / Family Education for ongoing care []  - 0 Staff obtains Programmer, systems, Records, Test Results / Process Orders []  - 0 Staff telephones HHA, Nursing Homes / Clarify orders / etc []  - 0 Routine Transfer to another Facility (non-emergent condition) []  - 0 Routine Hospital Admission (non-emergent condition) []  - 0 New Admissions / Biomedical engineer / Ordering NPWT, Apligraf, etc. []  - 0 Emergency Hospital Admission (emergent condition) X- 1 10 Simple Discharge Coordination []  - 0 Complex (extensive) Discharge Coordination PROCESS - Special Needs []  - Pediatric / Minor Patient Management 0 []  - 0 Isolation Patient Management []  - 0 Hearing / Language / Visual special needs []  - 0 Assessment of Community assistance (transportation, D/C planning, etc.) []  - 0 Additional assistance / Altered mentation []  - 0 Support Surface(s) Assessment (bed, cushion, seat, etc.) INTERVENTIONS - Wound Cleansing / Measurement Gandolfo, Lorain L. ([id number]) X- 1 5 Simple Wound Cleansing - one wound []  - 0 Complex Wound Cleansing - multiple wounds X- 1 5 Wound Imaging (photographs - any number of wounds) []  - 0 Wound Tracing (instead of photographs) X- 1 5 Simple Wound Measurement - one wound []  - 0 Complex Wound Measurement - multiple  wounds INTERVENTIONS - Wound Dressings []  - Small Wound Dressing one or multiple wounds 0 X- 1 15 Medium Wound Dressing one or multiple wounds []  - 0  Large Wound Dressing one or multiple wounds []  - 0 Application of Medications - topical []  - 0 Application of Medications - injection INTERVENTIONS - Miscellaneous []  - External ear exam 0 []  - 0 Specimen Collection (cultures, biopsies, blood, body fluids, etc.) []  - 0 Specimen(s) / Culture(s) sent or taken to Lab for analysis []  - 0 Patient Transfer (multiple staff / Civil Service fast streamer / Similar devices) []  - 0 Simple Staple / Suture removal (25 or less) []  - 0 Complex Staple / Suture removal (26 or more) []  - 0 Hypo / Hyperglycemic Management (close monitor of Blood Glucose) []  - 0 Ankle / Brachial Index (ABI) - do not check if billed separately X- 1 5 Vital Signs Has the patient been seen at the hospital within the last three years: Yes Total Score: 80 Level Of Care: New/Established - Level 3 Electronic Signature(s) Signed: 06/15/2021 4:52:43 PM By: Dolan Amen RN Entered By: Dolan Amen on 06/15/2021 10:01:27 [name] ([id number]) -------------------------------------------------------------------------------- Encounter Discharge Information Details Patient Name: Nicolas Cousin L. Date of Service: 06/15/2021 9:00 AM Medical Record Number: [id number] Patient Account Number: [id number] Date of Birth/Sex: [birth year] (50 y.o. M) Treating RN: Dolan Amen Primary Care Markice Torbert: PATIENT, NO Other Clinician: Referring Tmya Wigington: Barkley Boards Treating Drew Lips/Extender: Skipper Cliche in Treatment: 22 Encounter Discharge Information Items Discharge Condition: Stable Ambulatory Status: Ambulatory Discharge Destination: Home Transportation: Private Auto Accompanied By: self Schedule Follow-up Appointment: Yes Clinical Summary of Care: Electronic Signature(s) Signed: 06/15/2021 4:52:43 PM By: Dolan Amen  RN Entered By: Dolan Amen on 06/15/2021 10:02:31 [name] ([id number]) -------------------------------------------------------------------------------- Lower Extremity Assessment Details Patient Name: [name], [name] Date of Service: 06/15/2021 9:00 AM Medical Record Number: [id number] Patient Account Number: [id number] Date of Birth/Sex: 03-25-1971 (50 y.o. M) Treating RN: Dolan Amen Primary Care Antonin Meininger: PATIENT, NO Other Clinician: Referring Maximilliano Kersh: Barkley Boards Treating Caniya Tagle/Extender: Skipper Cliche in Treatment: 22 Edema Assessment Assessed: [Left: No] [Right: Yes] Edema: [Left: Ye] [Right: s] Calf Left: Right: Point of Measurement: 40 cm From Medial Instep 35 cm Ankle Left: Right: Point of Measurement: 10 cm From Medial Instep 24.5 cm Vascular Assessment Pulses: Dorsalis Pedis Palpable: [Right:Yes] Electronic Signature(s) Signed: 06/15/2021 4:52:43 PM By: Dolan Amen RN Entered By: Dolan Amen on 06/15/2021 09:25:26 [place]yer[place], Arlynn L. ([id number]) -------------------------------------------------------------------------------- Multi Wound Chart Details Patient Name: Nicolas Cousin L. Date of Service: 06/15/2021 9:00 AM Medical Record Number: [id number] Patient Account Number: [id number] Date of Birth/Sex: 1970/09/11 (50 y.o. M) Treating RN: Dolan Amen Primary Care Clary Meeker: PATIENT, NO Other Clinician: Referring Tallen Schnorr: Barkley Boards Treating Alveta Quintela/Extender: Skipper Cliche in Treatment: 22 Vital Signs Height(in): 78 Pulse(bpm): 30 Weight(lbs): 249 Blood Pressure(mmHg): 128/82 Body Mass Index(BMI): 29 Temperature(F): 98.2 Respiratory Rate(breaths/min): 18 Photos: [N/A:N/A] Wound Location: Right, Medial Lower Leg N/A N/A Wounding Event: Gradually Appeared N/A N/A Primary Etiology: Venous Leg Ulcer N/A N/A Date Acquired: 11/24/2020 N/A N/A Weeks of Treatment: 22 N/A N/A Wound Status: Open N/A N/A Measurements L x W x D (cm)  0.6x0.7x0.2 N/A N/A Area (cm) : 0.33 N/A N/A Volume (cm) : 0.066 N/A N/A % Reduction in Area: 61.10% N/A N/A % Reduction in Volume: 61.20% N/A N/A Classification: Full Thickness Without Exposed N/A N/A Support Structures Exudate Amount: Medium N/A N/A Exudate Type: Serosanguineous N/A N/A Exudate Color: red, brown N/A N/A Wound Margin: Flat and Intact N/A N/A Granulation Amount: Medium (34-66%) N/A N/A Granulation Quality: Pink N/A N/A Necrotic Amount: Medium (34-66%) N/A N/A Exposed Structures: Fat Layer (Subcutaneous Tissue): N/A N/A Yes Fascia:  No Tendon: No Muscle: No Joint: No Bone: No Epithelialization: Large (67-100%) N/A N/A Treatment Notes Electronic Signature(s) Signed: 06/15/2021 4:52:43 PM By: Dolan Amen RN Entered By: Dolan Amen on 06/15/2021 09:56:42 [place], [place] ([id number]) -------------------------------------------------------------------------------- [place] Details Patient Name: [name], [name] Date of Service: 06/15/2021 9:00 AM Medical Record Number: [id number] Patient Account Number: [id number] Date of Birth/Sex: 1971/02/03 (50 y.o. M) Treating RN: Dolan Amen Primary Care Arionne Iams: PATIENT, NO Other Clinician: Referring Fredia Chittenden: Barkley Boards Treating Jenisa Monty/Extender: Skipper Cliche in Treatment: 22 Active Inactive Electronic Signature(s) Signed: 06/15/2021 4:52:43 PM By: Dolan Amen RN Entered By: Dolan Amen on 06/15/2021 09:55:54 [place], [place] ([id number]) -------------------------------------------------------------------------------- Pain Assessment Details Patient Name: [name], [name] Date of Service: 06/15/2021 9:00 AM Medical Record Number: [id number] Patient Account Number: [id number] Date of Birth/Sex: 1970-10-27 (50 y.o. M) Treating RN: Dolan Amen Primary Care Tatijana Bierly: PATIENT, NO Other Clinician: Referring Gildo Crisco: Barkley Boards Treating Jerman Tinnon/Extender: Skipper Cliche in  Treatment: 22 Active Problems Location of Pain Severity and Description of Pain Patient Has Paino No Site Locations Pain Management and Medication Current Pain Management: Electronic Signature(s) Signed: 06/15/2021 4:52:43 PM By: Dolan Amen RN Entered By: Dolan Amen on 06/15/2021 09:17:54 [name] ([id number]) -------------------------------------------------------------------------------- Patient/Caregiver Education Details Patient Name: Nicolas Cousin L. Date of Service: 06/15/2021 9:00 AM Medical Record Number: [id number] Patient Account Number: [id number] Date of Birth/Gender: 10/06/1970 (50 y.o. M) Treating RN: Dolan Amen Primary Care Physician: PATIENT, NO Other Clinician: Referring Physician: Barkley Boards Treating Physician/Extender: Skipper Cliche in Treatment: 22 Education Assessment Education Provided To: Patient Education Topics Provided Wound/Skin Impairment: Methods: Explain/Verbal Responses: State content correctly Electronic Signature(s) Signed: 06/15/2021 4:52:43 PM By: Dolan Amen RN Entered By: Dolan Amen on 06/15/2021 10:01:41 [name] ([id number]) -------------------------------------------------------------------------------- Wound Assessment Details Patient Name: [name], [name] Date of Service: 06/15/2021 9:00 AM Medical Record Number: [id number] Patient Account Number: [id number] Date of Birth/Sex: [birth year] (50 y.o. M) Treating RN: Dolan Amen Primary Care Hermilo Dutter: PATIENT, NO Other Clinician: Referring Arnola Crittendon: Barkley Boards Treating Pegge Cumberledge/Extender: Skipper Cliche in Treatment: 22 Wound Status Wound Number: 1 Primary Etiology: Venous Leg Ulcer Wound Location: Right, Medial Lower Leg Wound Status: Open Wounding Event: Gradually Appeared Date Acquired: 11/24/2020 Weeks Of Treatment: 22 Clustered Wound: No Photos Wound Measurements Length: (cm) 0.6 Width: (cm) 0.7 Depth: (cm) 0.2 Area: (cm)  0.33 Volume: (cm) 0.066 % Reduction in Area: 61.1% % Reduction in Volume: 61.2% Epithelialization: Large (67-100%) Tunneling: No Undermining: No Wound Description Classification: Full Thickness Without Exposed Support Structu Wound Margin: Flat and Intact Exudate Amount: Medium Exudate Type: Serosanguineous Exudate Color: red, brown res Foul Odor After Cleansing: No Slough/Fibrino Yes Wound Bed Granulation Amount: Medium (34-66%) Exposed Structure Granulation Quality: Pink Fascia Exposed: No Necrotic Amount: Medium (34-66%) Fat Layer (Subcutaneous Tissue) Exposed: Yes Necrotic Quality: Adherent Slough Tendon Exposed: No Muscle Exposed: No Joint Exposed: No Bone Exposed: No Treatment Notes Wound #1 (Lower Leg) Wound Laterality: Right, Medial Cleanser Soap and Water Discharge Instruction: Gently cleanse wound with antibacterial soap, rinse and pat dry prior to dressing wounds Peri-Wound Care [name], [name] ([id number]) Topical Clobetasol Propionate ointment 0.05%, 60 (g) tube Discharge Instruction: Apply over collagen Primary Dressing Prisma 4.34 (in) Discharge Instruction: Moisten w/normal saline or sterile water; Cover wound as directed. Do not remove from wound bed. Secondary Dressing Telfa Adhesive [place] Dressing, 4x4 (in/in) Discharge Instruction: Apply over dressing to secure in place. Secured With Compression Wrap Compression Stockings Environmental education officer) Signed: 06/15/2021 4:52:43 PM By:  Dolan Amen RN Entered By: Dolan Amen on 06/15/2021 09:23:05 [name] ([id number]) -------------------------------------------------------------------------------- Vitals Details Patient Name: Nicolas Cousin L. Date of Service: 06/15/2021 9:00 AM Medical Record Number: [id number] Patient Account Number: [id number] Date of Birth/Sex: 07-14-1971 (50 y.o. M) Treating RN: Dolan Amen Primary Care Todd Jelinski: PATIENT, NO Other Clinician: Referring  Tomeka Kantner: Barkley Boards Treating Ellary Casamento/Extender: Skipper Cliche in Treatment: 22 Vital Signs Time Taken: 09:16 Temperature (F): 98.2 Height (in): 78 Pulse (bpm): 72 Weight (lbs): 249 Respiratory Rate (breaths/min): 18 Body Mass Index (BMI): 28.8 Blood Pressure (mmHg): 128/82 Reference Range: 80 - 120 mg / dl Electronic Signature(s) Signed: 06/15/2021 4:52:43 PM By: Dolan Amen RN Entered By: Dolan Amen on 06/15/2021 09:17:50

## 2021-06-22 ENCOUNTER — Encounter: Payer: Self-pay | Admitting: Physician Assistant

## 2021-06-29 ENCOUNTER — Encounter: Payer: Self-pay | Attending: Physician Assistant | Admitting: Physician Assistant

## 2021-06-29 ENCOUNTER — Other Ambulatory Visit: Payer: Self-pay

## 2021-06-29 DIAGNOSIS — I872 Venous insufficiency (chronic) (peripheral): Secondary | ICD-10-CM | POA: Insufficient documentation

## 2021-06-29 DIAGNOSIS — L97812 Non-pressure chronic ulcer of other part of right lower leg with fat layer exposed: Secondary | ICD-10-CM | POA: Insufficient documentation

## 2021-06-29 NOTE — Progress Notes (Signed)
ADAR, RASE ([id number]) Visit Report for 06/29/2021 Arrival Information Details Patient Name: [name], [name]. Date of Service: 06/29/2021 8:30 AM Medical Record Number: [id number] Patient Account Number: [id number] Date of Birth/Sex: 12/10/1970 (50 y.o. M) Treating RN: [name] Primary Care [name]: PATIENT, NO Other Clinician: Referring [name]: [name] Treating [name]/Extender: [name] in Treatment: 24 Visit Information History Since Last Visit Added or deleted any medications: No Patient Arrived: Ambulatory Had a fall or experienced change in No Arrival Time: 09:01 activities of daily living that may affect Accompanied By: self risk of falls: Transfer Assistance: None Hospitalized since last visit: No Patient Identification Verified: Yes Has Dressing in Place as Prescribed: Yes Secondary Verification Process Completed: Yes Pain Present Now: No Patient Requires Transmission-Based Precautions: No Patient Has Alerts: Yes Patient Alerts: NOT DIABETIC Electronic Signature(s) Signed: 06/29/2021 12:53:36 PM By: [name] Entered By: [name] on 06/29/2021 09:05:43 [name] ([id number]) -------------------------------------------------------------------------------- Clinic Level of Care Assessment Details Patient Name: Nicolas Cousin L. Date of Service: 06/29/2021 8:30 AM Medical Record Number: [id number] Patient Account Number: [id number] Date of Birth/Sex: June 22, 1971 (50 y.o. M) Treating RN: [name] Primary Care [name]: PATIENT, NO Other Clinician: Referring [name]: [name] Treating [name]/Extender: [name] in Treatment: 24 Clinic Level of Care Assessment Items TOOL 4 Quantity Score []  - Use when only an EandM is performed on FOLLOW-UP visit 0 ASSESSMENTS - Nursing Assessment / Reassessment []  - Reassessment of Co-morbidities (includes updates in patient status) 0 []  - 0 Reassessment of Adherence to Treatment  Plan ASSESSMENTS - Wound and Skin Assessment / Reassessment X - Simple Wound Assessment / Reassessment - one wound 1 5 []  - 0 Complex Wound Assessment / Reassessment - multiple wounds []  - 0 Dermatologic / Skin Assessment (not related to wound area) ASSESSMENTS - Focused Assessment []  - Circumferential Edema Measurements - multi extremities 0 []  - 0 Nutritional Assessment / Counseling / Intervention []  - 0 Lower Extremity Assessment (monofilament, tuning fork, pulses) []  - 0 Peripheral Arterial Disease Assessment (using hand held doppler) ASSESSMENTS - Ostomy and/or Continence Assessment and Care []  - Incontinence Assessment and Management 0 []  - 0 Ostomy Care Assessment and Management (repouching, etc.) PROCESS - Coordination of Care X - Simple Patient / Family Education for ongoing care 1 15 []  - 0 Complex (extensive) Patient / Family Education for ongoing care []  - 0 Staff obtains Programmer, systems, Records, Test Results / Process Orders []  - 0 Staff telephones HHA, Nursing Homes / Clarify orders / etc []  - 0 Routine Transfer to another Facility (non-emergent condition) []  - 0 Routine Hospital Admission (non-emergent condition) []  - 0 New Admissions / Biomedical engineer / Ordering NPWT, Apligraf, etc. []  - 0 Emergency Hospital Admission (emergent condition) X- 1 10 Simple Discharge Coordination []  - 0 Complex (extensive) Discharge Coordination PROCESS - Special Needs []  - Pediatric / Minor Patient Management 0 []  - 0 Isolation Patient Management []  - 0 Hearing / Language / Visual special needs []  - 0 Assessment of Community assistance (transportation, D/C planning, etc.) []  - 0 Additional assistance / Altered mentation []  - 0 Support Surface(s) Assessment (bed, cushion, seat, etc.) INTERVENTIONS - Wound Cleansing / Measurement Offerdahl, [name] ([id number]) X- 1 5 Simple Wound Cleansing - one wound []  - 0 Complex Wound Cleansing - multiple wounds X- 1 5 Wound  Imaging (photographs - any number of wounds) []  - 0 Wound Tracing (instead of photographs) X- 1 5 Simple Wound Measurement - one wound []  - 0  Complex Wound Measurement - multiple wounds INTERVENTIONS - Wound Dressings X - Small Wound Dressing one or multiple wounds 1 10 []  - 0 Medium Wound Dressing one or multiple wounds []  - 0 Large Wound Dressing one or multiple wounds X- 1 5 Application of Medications - topical []  - 0 Application of Medications - injection INTERVENTIONS - Miscellaneous []  - External ear exam 0 []  - 0 Specimen Collection (cultures, biopsies, blood, body fluids, etc.) []  - 0 Specimen(s) / Culture(s) sent or taken to Lab for analysis []  - 0 Patient Transfer (multiple staff / Civil Service fast streamer / Similar devices) []  - 0 Simple Staple / Suture removal (25 or less) []  - 0 Complex Staple / Suture removal (26 or more) []  - 0 Hypo / Hyperglycemic Management (close monitor of Blood Glucose) []  - 0 Ankle / Brachial Index (ABI) - do not check if billed separately X- 1 5 Vital Signs Has the patient been seen at the hospital within the last three years: Yes Total Score: 65 Level Of Care: New/Established - Level 2 Electronic Signature(s) Signed: 06/29/2021 12:53:36 PM By: [name] Entered By: [name] on 06/29/2021 09:13:40 [name] ([id number]) -------------------------------------------------------------------------------- Encounter Discharge Information Details Patient Name: Nicolas Cousin L. Date of Service: 06/29/2021 8:30 AM Medical Record Number: [id number] Patient Account Number: [id number] Date of Birth/Sex: 07-01-1971 (50 y.o. M) Treating RN: [name] Primary Care Sabrea Sankey: PATIENT, NO Other Clinician: Referring [name]: [name] Treating [name]/Extender: [name] in Treatment: 24 Encounter Discharge Information Items Discharge Condition: Stable Ambulatory Status: Ambulatory Discharge Destination: Home Telephoned: Yes Orders  Sent: Yes Transportation: Private Auto Accompanied By: self Schedule Follow-up Appointment: Yes Clinical Summary of Care: Electronic Signature(s) Signed: 06/29/2021 12:53:36 PM By: [name] Entered By: [name] on 06/29/2021 09:14:34 Wendell, Petronila ([id number]) -------------------------------------------------------------------------------- Lower Extremity Assessment Details Patient Name: Nicolas Cousin L. Date of Service: 06/29/2021 8:30 AM Medical Record Number: [id number] Patient Account Number: [id number] Date of Birth/Sex: March 21, 1971 (50 y.o. M) Treating RN: [name] Primary Care Cobe Viney: PATIENT, NO Other Clinician: Referring Quiana Cobaugh: [name] Treating Luvenia Cranford/Extender: [name] in Treatment: 24 Edema Assessment Assessed: [Left: No] [Right: Yes] [Left: Edema] [Right: :] Calf Left: Right: Point of Measurement: 40 cm From Medial Instep 34.5 cm Ankle Left: Right: Point of Measurement: 10 cm From Medial Instep 25 cm Electronic Signature(s) Signed: 06/29/2021 12:53:36 PM By: [name] Entered By: [name] on 06/29/2021 09:08:55 [place], [place] ([id number]) -------------------------------------------------------------------------------- Multi Wound Chart Details Patient Name: Nicolas Cousin L. Date of Service: 06/29/2021 8:30 AM Medical Record Number: [id number] Patient Account Number: [id number] Date of Birth/Sex: 11-22-1970 (50 y.o. M) Treating RN: [name] Primary Care Lameka Disla: PATIENT, NO Other Clinician: Referring Starlene Consuegra: [name] Treating Shelma Eiben/Extender: [name] in Treatment: 24 Vital Signs Height(in): 78 Pulse(bpm): 72 Weight(lbs): 249 Blood Pressure(mmHg): 145/77 Body Mass Index(BMI): 29 Temperature(F): 98.4 Respiratory Rate(breaths/min): 16 Photos: [N/A:N/A] Wound Location: Right, Medial Lower Leg N/A N/A Wounding Event: Gradually Appeared N/A N/A Primary Etiology: Venous Leg Ulcer N/A N/A Date Acquired:  11/24/2020 N/A N/A Weeks of Treatment: 24 N/A N/A Wound Status: Open N/A N/A Measurements L x W x D (cm) 0.6x0.7x0.2 N/A N/A Area (cm) : 0.33 N/A N/A Volume (cm) : 0.066 N/A N/A % Reduction in Area: 61.10% N/A N/A % Reduction in Volume: 61.20% N/A N/A Classification: Full Thickness Without Exposed N/A N/A Support Structures Exudate Amount: Medium N/A N/A Exudate Type: Serosanguineous N/A N/A Exudate Color: red, brown N/A N/A Wound Margin: Flat and Intact N/A  N/A Granulation Amount: Small (1-33%) N/A N/A Granulation Quality: Pink N/A N/A Necrotic Amount: Large (67-100%) N/A N/A Exposed Structures: Fat Layer (Subcutaneous Tissue): N/A N/A Yes Fascia: No Tendon: No Muscle: No Joint: No Bone: No Epithelialization: Large (67-100%) N/A N/A Treatment Notes Electronic Signature(s) Signed: 06/29/2021 12:53:36 PM By: [name] Entered By: [name] on 06/29/2021 09:11:23 [name] ([id number]) -------------------------------------------------------------------------------- [place] Details Patient Name: THERRON, SELLS L. Date of Service: 06/29/2021 8:30 AM Medical Record Number: [id number] Patient Account Number: [id number] Date of Birth/Sex: 06-02-1971 (50 y.o. M) Treating RN: [name] Primary Care Aby Gessel: PATIENT, NO Other Clinician: Referring Gelena Klosinski: [name] Treating Valencia Kassa/Extender: [name] in Treatment: 24 Active Inactive Electronic Signature(s) Signed: 06/29/2021 12:53:36 PM By: [name] Entered By: [name] on 06/29/2021 09:09:02 [place], [place] ([id number]) -------------------------------------------------------------------------------- Pain Assessment Details Patient Name: HARALD, QUEVEDO L. Date of Service: 06/29/2021 8:30 AM Medical Record Number: [id number] Patient Account Number: [id number] Date of Birth/Sex: 10-15-1970 (50 y.o. M) Treating RN: [name] Primary Care Jaqwan Wieber: PATIENT, NO Other  Clinician: Referring Lorely Bubb: [name] Treating Ita Fritzsche/Extender: [name] in Treatment: 24 Active Problems Location of Pain Severity and Description of Pain Patient Has Paino No Site Locations With Dressing Change: Yes Rate the pain. Current Pain Level: 0 Character of Pain Pain Management and Medication Current Pain Management: Electronic Signature(s) Signed: 06/29/2021 12:53:36 PM By: [name] Entered By: [name] on 06/29/2021 09:06:10 [name] ([id number]) -------------------------------------------------------------------------------- Patient/Caregiver Education Details Patient Name: Nicolas Cousin L. Date of Service: 06/29/2021 8:30 AM Medical Record Number: [id number] Patient Account Number: [id number] Date of Birth/Gender: 1971-01-24 (50 y.o. M) Treating RN: [name] Primary Care Physician: PATIENT, NO Other Clinician: Referring Physician: Barkley [name] Treating Physician/Extender: [name] in Treatment: 24 Education Assessment Education Provided To: Patient Education Topics Provided Basic Hygiene: Nutrition: Wound Debridement: Wound/Skin Impairment: Electronic Signature(s) Signed: 06/29/2021 12:53:36 PM By: [name] Entered By: [name] on 06/29/2021 09:14:00 [place], [place] ([id number]) -------------------------------------------------------------------------------- Wound Assessment Details Patient Name: DUWAYNE, MATTERS L. Date of Service: 06/29/2021 8:30 AM Medical Record Number: [id number] Patient Account Number: [id number] Date of Birth/Sex: 01-09-1971 (50 y.o. M) Treating RN: [name] Primary Care Solon Alban: PATIENT, NO Other Clinician: Referring Khoen Genet: [name] Treating Hiro Vipond/Extender: [name] in Treatment: 24 Wound Status Wound Number: 1 Primary Etiology: Venous Leg Ulcer Wound Location: Right, Medial Lower Leg Wound Status: Open Wounding Event: Gradually Appeared Date Acquired:  11/24/2020 Weeks Of Treatment: 24 Clustered Wound: No Photos Wound Measurements Length: (cm) 0.6 Width: (cm) 0.7 Depth: (cm) 0.2 Area: (cm) 0.33 Volume: (cm) 0.066 % Reduction in Area: 61.1% % Reduction in Volume: 61.2% Epithelialization: Large (67-100%) Tunneling: No Undermining: No Wound Description Classification: Full Thickness Without Exposed Support Structu Wound Margin: Flat and Intact Exudate Amount: Medium Exudate Type: Serosanguineous Exudate Color: red, brown res Foul Odor After Cleansing: No Slough/Fibrino Yes Wound Bed Granulation Amount: Small (1-33%) Exposed Structure Granulation Quality: Pink Fascia Exposed: No Necrotic Amount: Large (67-100%) Fat Layer (Subcutaneous Tissue) Exposed: Yes Necrotic Quality: Adherent Slough Tendon Exposed: No Muscle Exposed: No Joint Exposed: No Bone Exposed: No Treatment Notes Wound #1 (Lower Leg) Wound Laterality: Right, Medial Cleanser Soap and Water Discharge Instruction: Gently cleanse wound with antibacterial soap, rinse and pat dry prior to dressing wounds Peri-Wound Care JOESPH, MARCY L. ([id number]) Topical Clobetasol Propionate ointment 0.05%, 60 (g) tube Discharge Instruction: Apply over collagen Primary Dressing Prisma 4.34 (in) Discharge Instruction: Moisten w/normal saline or sterile water; Cover wound as directed.  Do not remove from wound bed. Secondary Dressing Telfa Adhesive [place] Dressing, 4x4 (in/in) Discharge Instruction: Apply over dressing to secure in place. Secured With Compression Wrap Compression Stockings Add-Ons Electronic Signature(s) Signed: 06/29/2021 12:53:36 PM By: [name] Entered By: [name] on 06/29/2021 09:08:20 [name] ([id number]) -------------------------------------------------------------------------------- Vitals Details Patient Name: Nicolas Cousin L. Date of Service: 06/29/2021 8:30 AM Medical Record Number: [id number] Patient Account Number:  [id number] Date of Birth/Sex: [birth year] (50 y.o. M) Treating RN: [name] Primary Care Lynesha Bango: PATIENT, NO Other Clinician: Referring Javonni Macke: [name] Treating Joycie Aerts/Extender: [name] in Treatment: 24 Vital Signs Time Taken: 09:05 Temperature (F): 98.4 Height (in): 78 Pulse (bpm): 72 Weight (lbs): 249 Respiratory Rate (breaths/min): 16 Body Mass Index (BMI): 28.8 Blood Pressure (mmHg): 145/77 Reference Range: 80 - 120 mg / dl Electronic Signature(s) Signed: 06/29/2021 12:53:36 PM By: [name] Entered ByDonnamarie [name] on 06/29/2021 09:06:02

## 2021-06-29 NOTE — Progress Notes (Addendum)
TIGRAN, HAYNIE ([id number]) Visit Report for 06/29/2021 Chief Complaint Document Details Patient Name: [name], [name]. Date of Service: 06/29/2021 8:30 AM Medical Record Number: [id number] Patient Account Number: [id number] Date of Birth/Sex: Apr 03, 1971 (51 y.o. M) Treating RN: Donnamarie Poag Primary Care Provider: PATIENT, NO Other Clinician: Referring Provider: Barkley Boards Treating Provider/Extender: Skipper Cliche in Treatment: 24 Information Obtained from: Patient Chief Complaint Right LE Ulcer Electronic Signature(s) Signed: 06/29/2021 9:09:51 AM By: Worthy Keeler PA-C Entered By: Worthy Keeler on 06/29/2021 09:09:51 [place], [place] ([id number]) -------------------------------------------------------------------------------- HPI Details Patient Name: Nicolas Cousin L. Date of Service: 06/29/2021 8:30 AM Medical Record Number: [id number] Patient Account Number: [id number] Date of Birth/Sex: 01/29/1971 (50 y.o. M) Treating RN: Donnamarie Poag Primary Care Provider: PATIENT, NO Other Clinician: Referring Provider: Barkley Boards Treating Provider/Extender: Skipper Cliche in Treatment: 24 History of Present Illness HPI Description: 01/12/2021 upon evaluation today patient appears to be doing somewhat poorly in regard to a wound on his right medial lower leg. Currently he tells me that this has been going on since around the beginning of April. He has done a telehealth visit with a physician who initially gave him a triamcinolone cream along with Keflex for 5 days. Subsequently he ended up being seen at walk-in urgent care where they also given Keflex for 7 days that seem to be doing better for him. Overall he tells me that things have improved from the standpoint of redness. Fortunately there does not appear to be any signs of systemic infection to be honest right now even locally I do not see any evidence of infection right now. He does have 1 main wound with some scattering areas that  look like they may have been small ulcerations but again for the most part these have filled in. This does look almost to be more of a vasculitis type scenario based on what I am seeing. Patient does have chronic venous insufficiency but is not wearing any compression even though it sounds like he has been told before he should. 5/27; patient with small wounds on his right medial ankle. He has chronic venous insufficiency and stasis dermatitis a large open wound and several smaller areas all of which looks better than on presentation last week according to her nursing staff 01/26/2021 upon evaluation today patient appears to be doing well with regard to his wound. He is making good progress. With that being said there is some need currently for sharp debridement in regard to the wound there is some necrotic debris it is almost completely cleaned off but not completely. He is in agreement with that plan today. Fortunately there does not appear to be any evidence of active infection which is great and I am very pleased in that regard. He still wishes he did not have to wear the compression wrap. He did get his compression socks from [facility] therapy though they are in the 15 to 20 mmHg range they did not have any his length as he is a tall guy in the 20 to 30 mmHg range. 02/02/2021 upon evaluation today patient appears to be doing excellent in regard to his leg ulcer. He has been tolerating the dressing changes without complication. Fortunately there is no evidence of active infection at this time. No fevers, chills, nausea, vomiting, or diarrhea. 02/09/2021 upon evaluation today patient's wound actually showing signs of improvement. Fortunately there does not appear to be any evidence of active infection which is great news overall very pleased with where  things stand today. 02/16/2021 upon evaluation today patient's wound is showing some signs of improvement. He did have a fairly aggressive debridement last  week he notes that he had quite a bit of discomfort he was some swelling following. With that being said I think this was to be expected considering what we had to do from a debridement standpoint. The good news is there does not appear to be any evidence of infection currently nonetheless I do believe that the patient may have some signs of vasculitis here. Based on the overall appearance of the wound today and beginning to suspect this. For that reason I think he would benefit from starting him on triamcinolone ointment to the wound bed and some of the immediate periwound to try to help out in this regard. 02/23/2021 upon evaluation today patient appears to be doing well with regard to his wound. He is actually making some progress here. The wound is roughly about the same size but the overall appearance of the wound bed is much improved. I do not see any signs of active infection at this time which is great news. No fevers, chills, nausea, vomiting, or diarrhea. 03/02/2021 upon evaluation today patient appears to be doing well with regard to his wound. I think the biggest issue I see right now is that with Hydrofera Blue this wound is staying somewhat dry. I think he could benefit from the use of collagen to try to help with getting this to stimulate some additional growth. He is in agreement with the plan. I think this also had a little bit more moisture which would be helpful at this point. 03/09/2021 on evaluation today patient appears to be doing about the same in regard to his wound is measuring a little bit smaller but still he does have the surface of the wound which is not quite as healthy as I would like to see. Fortunately there does not appear to be any active infection at this time. No fevers, chills, nausea, vomiting, or diarrhea. 03/16/2021 upon evaluation today patient appears to be doing better with regard to the overall appearance of his wound bed. There does not appear to be any signs  of significant slough buildup I think the Iodoflex has done very well for him. The patient's happy to hear this and see the improvement. With that being said he is continuing to have a little bit of expansion of the wound again I think this is more related to #1 edema control and #2 the fact that again we will get the wound bed clean. I feel like were pretty much today as far as cleaning up the wound bed is concerned now we just need to make sure the edema is optimized. I am just not certain that his compression socks are doing the job. 03/23/2021 upon evaluation today patient unfortunately appears to be doing a little bit worse with regard to his wound I was actually expecting this to be much better this week. With that being said I am getting concerned about the possibility of pyoderma gangrenosum. I did print off some information for the patient in this regard. Subsequently I am also can see about starting him on some steroid cream as well as an oral steroid. 03/30/2021 upon evaluation today patient appears to be doing significantly better in regard to his wound. He has started using the clobetasol which seems to have made a excellent improvement in regard to the wound today. 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 Either way at this point I would recommend that we avoid sharp debridements I think that is probably can be the best option based on what I am seeing currently. The patient voiced understanding. He also had questions about taking the prednisone with regard to his immune system. I explained that I do believe that that can be an issue long-term but for short-term which is mainly what we are doing here I do not think it is going to be a major issue for him and in fact I think would help more especially considering the  pyoderma here and the fact that is more of an autoimmune type issue than not using the prednisone. 04/06/2021 upon evaluation today patient's wound actually showed signs of excellent improvement. This definitely has gone a lot better since we started with the topical steroids and overall I am extremely pleased with where things stand. No fevers, chills, nausea, vomiting, or diarrhea. With that being said the patient continues to feel much better and states that the pain is actually dramatically improved as well. This definitely appears [name], [name] ([id number]) to be a pyoderma situation. 04/13/2021 upon evaluation today patient appears to be doing well with regard to his wound. Fortunately there does not appear to be any signs of active infection at this time. No fevers, chills, nausea, vomiting, or diarrhea. With that being said I do believe that he is overall doing extremely well. This is measuring smaller and does seem to be headed in the right direction. 04/20/2021 upon evaluation today patient appears to be doing well with regard to his wound. He still has quite a bit of slough covering the surface of the wound. With that being said we previously debrided the wound he actually had a bit of worsening and therefore I am somewhat reluctant to proceed with any further debridement at this point. In fact this got quite a bit larger postdebridement. 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 Nonetheless I think it is can to take time to get this to heal. 04/27/2021 upon evaluation today patient appears to be doing well with regard to his wound. This is measuring smaller this week yet again and overall I am extremely pleased with where we stand currently. Fortunately there does not appear to be any evidence of active infection which is great news. No fevers, chills, nausea, vomiting, or diarrhea. 05/04/2021 upon evaluation today patient's wound actually has a significant amount of biofilm noted  on the surface of the wound. Fortunately there does not appear to be any evidence of active infection at this time which is great news and overall I am extremely pleased with where we stand today. However I do think we may need to try to see about a sharp debridement to clear away some of the slough and biofilm on the surface of the wound to try to help this to heal more effectively and quickly. Patient voiced understanding. 05/11/2021 upon evaluation today patient appears to be doing better in regard to his wound. He has been tolerating the dressing changes without complication. Fortunately there does not appear to be any signs of active infection at this time which is great news. No fever chills noted 05/25/2021 upon evaluation today patient's wound is actually showing signs of excellent improvement. Fortunately there does not appear to  be any evidence of infection at this time which is great news and overall I am extremely pleased with where we stand currently. I do feel like that the patient is improving although is very slow where little by little seen in this improvement. 06/01/2021 upon evaluation today patient appears to be doing well with regard to his wound. She has been tolerating the dressing changes without complication. With that being said it somewhat slow to heal at this point. I do think we could try to switch things up a little bit and see about using collagen in place that what we have been doing. The patient is in agreement with that plan. 06/08/2021 upon evaluation today patient appears to be doing well with regard to his wound I do feel like the collagen is helping with a little additional granulation. Fortunately there does not appear to be any signs of infection which is great news. No fevers, chills, nausea, vomiting, or diarrhea. 06/15/2021 upon evaluation today patient appears to be doing well with regard to his wound. In fact this is showing signs of improvement is going  slowly but nonetheless making good progress in my opinion. I do not see any signs of active infection at this time. No fevers, chills, nausea, vomiting, or diarrhea. 06/29/2021 upon evaluation today patient appears to be doing well with regard to his leg ulcer. He has been tolerating the dressing changes without complication. Fortunately there does not appear to be any evidence of active infection which is great and overall I am extremely pleased with where we stand today. He still is using the clobetasol just a small amount over top of the collagen. Electronic Signature(s) Signed: 06/29/2021 9:36:34 AM By: Worthy Keeler PA-C Entered By: Worthy Keeler on 06/29/2021 09:36:34 [place], Carrick Carlean Jews ([id number]) -------------------------------------------------------------------------------- Physical Exam Details Patient Name: [name], [name] Date of Service: 06/29/2021 8:30 AM Medical Record Number: [id number] Patient Account Number: [id number] Date of Birth/Sex: 12/23/1970 (50 y.o. M) Treating RN: Donnamarie Poag Primary Care Provider: PATIENT, NO Other Clinician: Referring Provider: Barkley Boards Treating Provider/Extender: Skipper Cliche in Treatment: 1 Constitutional Well-nourished and well-hydrated in no acute distress. Respiratory normal breathing without difficulty. Psychiatric this patient is able to make decisions and demonstrates good insight into disease process. Alert and Oriented x 3. pleasant and cooperative. Notes Patient's wound did not require sharp debridement again we have been avoiding debridement in general due to the fact that he does have an inflammatory condition likely pyoderma going on here. Subsequently we debrided this it tended to get worse not better. Overall however I feel like he is really doing quite well. Electronic Signature(s) Signed: 06/29/2021 9:36:58 AM By: Worthy Keeler PA-C Entered By: Worthy Keeler on 06/29/2021 09:36:58 Funk, Raphael Carlean Jews  ([id number]) -------------------------------------------------------------------------------- Physician Orders Details Patient Name: [name], [name] Date of Service: 06/29/2021 8:30 AM Medical Record Number: [id number] Patient Account Number: [id number] Date of Birth/Sex: [birth year] (50 y.o. M) Treating RN: Donnamarie Poag Primary Care Provider: PATIENT, NO Other Clinician: Referring Provider: Barkley Boards Treating Provider/Extender: Skipper Cliche in Treatment: 24 Verbal / Phone Orders: No Diagnosis Coding ICD-10 Coding Code Description I87.2 Venous insufficiency (chronic) (peripheral) L97.812 Non-pressure chronic ulcer of other part of right lower leg with fat layer exposed Follow-up Appointments o Return Appointment in 2 weeks. Bathing/ Shower/ Hygiene o May shower; gently cleanse wound with antibacterial soap, rinse and pat dry prior to dressing wounds o No tub bath. o Other: - avoid hot tubs, pools and oceans Edema  Control - Lymphedema / Segmental Compressive Device / Other Bilateral Lower Extremities o Patient to wear own compression stockings. Remove compression stockings every night before going to bed and put on every morning when getting up. o Elevate, Exercise Daily and Avoid Standing for Long Periods of Time. o Elevate legs to the level of the heart and pump ankles as often as possible o Elevate leg(s) parallel to the floor when sitting. o DO YOUR BEST to sleep in the bed at night. DO NOT sleep in your recliner. Long hours of sitting in a recliner leads to swelling of the legs and/or potential wounds on your backside. Additional Orders / Instructions o Follow Nutritious Diet and Increase Protein Intake Wound Treatment Wound #1 - Lower Leg Wound Laterality: Right, Medial Cleanser: Soap and Water Every Other Day/30 Days Discharge Instructions: Gently cleanse wound with antibacterial soap, rinse and pat dry prior to dressing wounds Topical: Clobetasol  Propionate ointment 0.05%, 60 (g) tube Every Other Day/30 Days Discharge Instructions: Apply over collagen Primary Dressing: Prisma 4.34 (in) Every Other Day/30 Days Discharge Instructions: Moisten w/normal saline or sterile water; Cover wound as directed. Do not remove from wound bed. Secondary Dressing: Wimer Dressing, 4x4 (in/in) Every Other Day/30 Days Discharge Instructions: Apply over dressing to secure in place. Electronic Signature(s) Signed: 06/29/2021 12:53:36 PM By: Donnamarie Poag Signed: 06/29/2021 4:56:27 PM By: Worthy Keeler PA-C Entered By: Donnamarie Poag on 06/29/2021 [place], [place] ([id number]) -------------------------------------------------------------------------------- Problem List Details Patient Name: [name], [name] Date of Service: 06/29/2021 8:30 AM Medical Record Number: [id number] Patient Account Number: [id number] Date of Birth/Sex: 15-Feb-1971 (50 y.o. M) Treating RN: Donnamarie Poag Primary Care Provider: PATIENT, NO Other Clinician: Referring Provider: Barkley Boards Treating Provider/Extender: Skipper Cliche in Treatment: 24 Active Problems ICD-10 Encounter Code Description Active Date MDM Diagnosis I87.2 Venous insufficiency (chronic) (peripheral) 01/12/2021 No Yes L97.812 Non-pressure chronic ulcer of other part of right lower leg with fat layer 01/12/2021 No Yes exposed Inactive Problems Resolved Problems Electronic Signature(s) Signed: 06/29/2021 9:09:39 AM By: Worthy Keeler PA-C Entered By: Worthy Keeler on 06/29/2021 [place], [place] ([id number]) -------------------------------------------------------------------------------- Progress Note Details Patient Name: Nicolas Cousin L. Date of Service: 06/29/2021 8:30 AM Medical Record Number: [id number] Patient Account Number: [id number] Date of Birth/Sex: 1971/04/18 (50 y.o. M) Treating RN: Donnamarie Poag Primary Care Provider: PATIENT, NO Other Clinician: Referring  Provider: Barkley Boards Treating Provider/Extender: Skipper Cliche in Treatment: 24 Subjective Chief Complaint Information obtained from Patient Right LE Ulcer History of Present Illness (HPI) 01/12/2021 upon evaluation today patient appears to be doing somewhat poorly in regard to a wound on his right medial lower leg. Currently he tells me that this has been going on since around the beginning of April. He has done a telehealth visit with a physician who initially gave him a triamcinolone cream along with Keflex for 5 days. Subsequently he ended up being seen at walk-in urgent care where they also given Keflex for 7 days that seem to be doing better for him. Overall he tells me that things have improved from the standpoint of redness. Fortunately there does not appear to be any signs of systemic infection to be honest right now even locally I do not see any evidence of infection right now. He does have 1 main wound with some scattering areas that look like they may have been small ulcerations but again for the most part these have filled in. This does look almost to be more  of a vasculitis type scenario based on what I am seeing. Patient does have chronic venous insufficiency but is not wearing any compression even though it sounds like he has been told before he should. 5/27; patient with small wounds on his right medial ankle. He has chronic venous insufficiency and stasis dermatitis a large open wound and several smaller areas all of which looks better than on presentation last week according to her nursing staff 01/26/2021 upon evaluation today patient appears to be doing well with regard to his wound. He is making good progress. With that being said there is some need currently for sharp debridement in regard to the wound there is some necrotic debris it is almost completely cleaned off but not completely. He is in agreement with that plan today. Fortunately there does not appear to be any  evidence of active infection which is great and I am very pleased in that regard. He still wishes he did not have to wear the compression wrap. He did get his compression socks from [facility] therapy though they are in the 15 to 20 mmHg range they did not have any his length as he is a tall guy in the 20 to 30 mmHg range. 02/02/2021 upon evaluation today patient appears to be doing excellent in regard to his leg ulcer. He has been tolerating the dressing changes without complication. Fortunately there is no evidence of active infection at this time. No fevers, chills, nausea, vomiting, or diarrhea. 02/09/2021 upon evaluation today patient's wound actually showing signs of improvement. Fortunately there does not appear to be any evidence of active infection which is great news overall very pleased with where things stand today. 02/16/2021 upon evaluation today patient's wound is showing some signs of improvement. He did have a fairly aggressive debridement last week he notes that he had quite a bit of discomfort he was some swelling following. With that being said I think this was to be expected considering what we had to do from a debridement standpoint. The good news is there does not appear to be any evidence of infection currently nonetheless I do believe that the patient may have some signs of vasculitis here. Based on the overall appearance of the wound today and beginning to suspect this. For that reason I think he would benefit from starting him on triamcinolone ointment to the wound bed and some of the immediate periwound to try to help out in this regard. 02/23/2021 upon evaluation today patient appears to be doing well with regard to his wound. He is actually making some progress here. The wound is roughly about the same size but the overall appearance of the wound bed is much improved. I do not see any signs of active infection at this time which is great news. No fevers, chills, nausea, vomiting,  or diarrhea. 03/02/2021 upon evaluation today patient appears to be doing well with regard to his wound. I think the biggest issue I see right now is that with Hydrofera Blue this wound is staying somewhat dry. I think he could benefit from the use of collagen to try to help with getting this to stimulate some additional growth. He is in agreement with the plan. I think this also had a little bit more moisture which would be helpful at this point. 03/09/2021 on evaluation today patient appears to be doing about the same in regard to his wound is measuring a little bit smaller but still he does have the surface of the wound which  is not quite as healthy as I would like to see. Fortunately there does not appear to be any active infection at this time. No fevers, chills, nausea, vomiting, or diarrhea. 03/16/2021 upon evaluation today patient appears to be doing better with regard to the overall appearance of his wound bed. There does not appear to be any signs of significant slough buildup I think the Iodoflex has done very well for him. The patient's happy to hear this and see the improvement. With that being said he is continuing to have a little bit of expansion of the wound again I think this is more related to #1 edema control and #2 the fact that again we will get the wound bed clean. I feel like were pretty much today as far as cleaning up the wound bed is concerned now we just need to make sure the edema is optimized. I am just not certain that his compression socks are doing the job. 03/23/2021 upon evaluation today patient unfortunately appears to be doing a little bit worse with regard to his wound I was actually expecting this to be much better this week. With that being said I am getting concerned about the possibility of pyoderma gangrenosum. I did print off some information for the patient in this regard. Subsequently I am also can see about starting him on some steroid cream as well as an oral  steroid. 03/30/2021 upon evaluation today patient appears to be doing significantly better in regard to his wound. He has started using the clobetasol which seems to have made a excellent improvement in regard to the wound today. 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 Either way at this point I would recommend that we avoid sharp debridements I think that is probably can be the best option based on what I am seeing currently. The patient voiced understanding. He also had questions about taking the prednisone with regard to his immune system. 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 04/06/2021 upon evaluation today patient's wound actually showed signs of excellent improvement. This definitely has gone a lot better since we started with the topical steroids and overall I am extremely pleased with where things stand. No fevers, chills, nausea, vomiting, or diarrhea. With that being said the patient continues to feel much better and states that the pain is actually dramatically improved as well. 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 04/13/2021 upon evaluation today patient appears to be doing well with regard to his wound. Fortunately there does not appear to be any signs of active infection at this time. No fevers, chills, nausea, vomiting, or diarrhea. With that being said I do believe that he is overall doing extremely well. This is measuring smaller and does seem to be headed in the right  direction. 04/20/2021 upon evaluation today patient appears to be doing well with regard to his wound. He still has quite a bit of slough covering the  surface of the wound. With that being said we previously debrided the wound he actually had a bit of worsening and therefore I am somewhat reluctant to proceed with any further debridement at this point. In fact this got quite a bit larger postdebridement. 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 Nonetheless I think it is can to take time to get this to heal. 04/27/2021 upon evaluation today patient appears to be doing well with regard to his wound. This is measuring smaller this week yet again and overall I am extremely pleased with where we stand currently. Fortunately there does not appear to be any evidence of active infection which is great news. No fevers, chills, nausea, vomiting, or diarrhea. 05/04/2021 upon evaluation today patient's wound actually has a significant amount of biofilm noted on the surface of the wound. Fortunately there does not appear to be any evidence of active infection at this time which is great news and overall I am extremely pleased with where we stand today. However I do think we may need to try to see about a sharp debridement to clear away some of the slough and biofilm on the surface of the wound to try to help this to heal more effectively and quickly. Patient voiced understanding. 05/11/2021 upon evaluation today patient appears to be doing better in regard to his wound. He has been tolerating the dressing changes without complication. Fortunately there does not appear to be any signs of active infection at this time which is great news. No fever chills noted 05/25/2021 upon evaluation today patient's wound is actually showing signs of excellent improvement. Fortunately there does not appear to be any evidence of infection at this time which is great news and overall I am extremely pleased with where we stand  currently. I do feel like that the patient is improving although is very slow where little by little seen in this improvement. 06/01/2021 upon evaluation today patient appears to be doing well with regard to his wound. She has been tolerating the dressing changes without complication. With that being said it somewhat slow to heal at this point. I do think we could try to switch things up a little bit and see about using collagen in place that what we have been doing. The patient is in agreement with that plan. 06/08/2021 upon evaluation today patient appears to be doing well with regard to his wound I do feel like the collagen is helping with a little additional granulation. Fortunately there does not appear to be any signs of infection which is great news. No fevers, chills, nausea, vomiting, or diarrhea. 06/15/2021 upon evaluation today patient appears to be doing well with regard to his wound. In fact this is showing signs of improvement is going slowly but nonetheless making good progress in my opinion. I do not see any signs of active infection at this time. No fevers, chills, nausea, vomiting, or diarrhea. 06/29/2021 upon evaluation today patient appears to be doing well with regard to his leg ulcer. He has been tolerating the dressing changes without complication. Fortunately there does not appear to be any evidence of active infection which is great and overall I am extremely pleased with where we stand today. He still is using the clobetasol just a small amount over top of the collagen. Objective Constitutional Well-nourished and well-hydrated in no acute distress. Vitals Time Taken: 9:05 AM, Height: 78 in, Weight: 249 lbs, BMI: 28.8, Temperature: 98.4 F, Pulse: 72 bpm, Respiratory Rate: 16 breaths/min,  Blood Pressure: 145/77 mmHg. Respiratory normal breathing without difficulty. Psychiatric this patient is able to make decisions and demonstrates good insight into disease process.  Alert and Oriented x 3. pleasant and cooperative. 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 Subsequently we debrided this it tended to get worse not better. Overall however I feel like he is really doing quite well. Integumentary (Hair, Skin) Wound #1 status is Open. Original cause of wound was Gradually Appeared. The date acquired was: 11/24/2020. The wound has been in treatment [name], [name] ([id number]) 24 weeks. The wound is located on the Right,Medial Lower Leg. The wound measures 0.6cm length x 0.7cm width x 0.2cm depth; 0.33cm^2 area and 0.066cm^3 volume. There is Fat Layer (Subcutaneous Tissue) exposed. There is no tunneling or undermining noted. There is a medium amount of serosanguineous drainage noted. The wound margin is flat and intact. There is small (1-33%) pink granulation within the wound bed. There is a large (67-100%) amount of necrotic tissue within the wound bed including Adherent Slough. Assessment Active Problems ICD-10 Venous insufficiency (chronic) (peripheral) Non-pressure chronic ulcer of other part of right lower leg with fat layer exposed Plan Follow-up Appointments: Return Appointment in 2 weeks. Bathing/ Shower/ Hygiene: May shower; gently cleanse wound with antibacterial soap, rinse and pat dry prior to dressing wounds No tub bath. Other: - avoid hot tubs, pools and oceans Edema Control - Lymphedema / Segmental Compressive Device / Other: Patient to wear own compression stockings. Remove compression stockings every night before going to bed and put on every morning when getting up. Elevate, Exercise Daily and Avoid Standing for Long Periods of Time. Elevate legs to the level of the heart and pump ankles as often as possible Elevate leg(s) parallel to the floor when sitting. DO YOUR BEST to  sleep in the bed at night. DO NOT sleep in your recliner. Long hours of sitting in a recliner leads to swelling of the legs and/or potential wounds on your backside. Additional Orders / Instructions: Follow Nutritious Diet and Increase Protein Intake WOUND #1: - Lower Leg Wound Laterality: Right, Medial Cleanser: Soap and Water Every Other Day/30 Days Discharge Instructions: Gently cleanse wound with antibacterial soap, rinse and pat dry prior to dressing wounds Topical: Clobetasol Propionate ointment 0.05%, 60 (g) tube Every Other Day/30 Days Discharge Instructions: Apply over collagen Primary Dressing: Prisma 4.34 (in) Every Other Day/30 Days Discharge Instructions: Moisten w/normal saline or sterile water; Cover wound as directed. Do not remove from wound bed. Secondary Dressing: Zalma Dressing, 4x4 (in/in) Every Other Day/30 Days Discharge Instructions: Apply over dressing to secure in place. 1. Would recommend currently that we going to continue with the wound care measures as before and the patient is in agreement with the plan this includes the use of the collagen to the wound bed followed by thin film of the clobetasol over top. 2. Muscle can recommend that we have the patient continue with a Telfa island dressing to cover. 3. I would also suggest that we have the patient continue to monitor for any signs of worsening or infection. We will see patient back for reevaluation in 1 week here in the clinic. If anything worsens or changes patient will contact our office for additional recommendations. Electronic Signature(s) Signed: 06/29/2021 9:45:35 AM By: Worthy Keeler PA-C Entered By: Worthy Keeler on 06/29/2021  09:45:35 TRENTYN, [name] ([id number]) -------------------------------------------------------------------------------- [place] Details Patient Name: [name], [name]. Date of Service: 06/29/2021 Medical Record Number: [id number] Patient Account Number:  [id number] Date of Birth/Sex: 1970/11/18 (50 y.o. M) Treating RN: Donnamarie Poag Primary Care Provider: PATIENT, NO Other Clinician: Referring Provider: Barkley Boards Treating Provider/Extender: Skipper Cliche in Treatment: 24 Diagnosis Coding ICD-10 Codes Code Description I87.2 Venous insufficiency (chronic) (peripheral) L97.812 Non-pressure chronic ulcer of other part of right lower leg with fat layer exposed Facility Procedures CPT4 Code: 71062694 Description: [phone] - WOUND CARE VISIT-LEV 2 EST PT Modifier: Quantity: 1 Physician Procedures CPT4 Code: [id number] Description: [id number] - WC PHYS LEVEL 3 - EST PT Modifier: Quantity: 1 CPT4 Code: Description: ICD-10 Diagnosis Description I87.2 Venous insufficiency (chronic) (peripheral) L97.812 Non-pressure chronic ulcer of other part of right lower leg with fat la Modifier: yer exposed Quantity: Electronic Signature(s) Signed: 06/29/2021 9:45:52 AM By: Worthy Keeler PA-C Entered By: Worthy Keeler on 06/29/2021 09:45:52

## 2021-07-13 ENCOUNTER — Encounter: Payer: Self-pay | Admitting: Physician Assistant

## 2021-07-13 ENCOUNTER — Other Ambulatory Visit: Payer: Self-pay

## 2021-07-13 NOTE — Progress Notes (Signed)
[name], [name] ([id number]) Visit Report for 07/13/2021 Arrival Information Details Patient Name: [name], [name]. Date of Service: 07/13/2021 9:00 AM Medical Record Number: [id number] Patient Account Number: [id number] Date of Birth/Sex: 02-Feb-1971 (50 y.o. M) Treating RN: Cornell Barman Primary Care Yuliana Vandrunen: PATIENT, NO Other Clinician: Referring Lilo Wallington: Barkley Boards Treating Delvonte Berenson/Extender: Skipper Cliche in Treatment: 26 Visit Information History Since Last Visit Added or deleted any medications: No Patient Arrived: Ambulatory Has Dressing in Place as Prescribed: Yes Arrival Time: 09:14 Pain Present Now: No Accompanied By: self Transfer Assistance: None Patient Identification Verified: Yes Secondary Verification Process Completed: Yes Patient Requires Transmission-Based Precautions: No Patient Has Alerts: Yes Patient Alerts: NOT DIABETIC Electronic Signature(s) Signed: 07/13/2021 3:56:18 PM By: Gretta Cool, BSN, RN, CWS, Kim RN, BSN Entered By: Gretta Cool, BSN, RN, CWS, Kim on 07/13/2021 09:14:44 [name] ([id number]) -------------------------------------------------------------------------------- Clinic Level of Care Assessment Details Patient Name: [name], [name] L. Date of Service: 07/13/2021 9:00 AM Medical Record Number: [id number] Patient Account Number: [id number] Date of Birth/Sex: 1971-08-15 (50 y.o. M) Treating RN: Cornell Barman Primary Care Mulki Roesler: PATIENT, NO Other Clinician: Referring Appollonia Klee: Barkley Boards Treating Raylynn Hersh/Extender: Skipper Cliche in Treatment: 26 Clinic Level of Care Assessment Items TOOL 4 Quantity Score []  - Use when only an EandM is performed on FOLLOW-UP visit 0 ASSESSMENTS - Nursing Assessment / Reassessment X - Reassessment of Co-morbidities (includes updates in patient status) 1 10 X- 1 5 Reassessment of Adherence to Treatment Plan ASSESSMENTS - Wound and Skin Assessment / Reassessment X - Simple Wound Assessment / Reassessment  - one wound 1 5 []  - 0 Complex Wound Assessment / Reassessment - multiple wounds []  - 0 Dermatologic / Skin Assessment (not related to wound area) ASSESSMENTS - Focused Assessment []  - Circumferential Edema Measurements - multi extremities 0 []  - 0 Nutritional Assessment / Counseling / Intervention []  - 0 Lower Extremity Assessment (monofilament, tuning fork, pulses) []  - 0 Peripheral Arterial Disease Assessment (using hand held doppler) ASSESSMENTS - Ostomy and/or Continence Assessment and Care []  - Incontinence Assessment and Management 0 []  - 0 Ostomy Care Assessment and Management (repouching, etc.) PROCESS - Coordination of Care X - Simple Patient / Family Education for ongoing care 1 15 []  - 0 Complex (extensive) Patient / Family Education for ongoing care X- 1 10 Staff obtains Programmer, systems, Records, Test Results / Process Orders []  - 0 Staff telephones HHA, Nursing Homes / Clarify orders / etc []  - 0 Routine Transfer to another Facility (non-emergent condition) []  - 0 Routine Hospital Admission (non-emergent condition) []  - 0 New Admissions / Biomedical engineer / Ordering NPWT, Apligraf, etc. []  - 0 Emergency Hospital Admission (emergent condition) X- 1 10 Simple Discharge Coordination []  - 0 Complex (extensive) Discharge Coordination PROCESS - Special Needs []  - Pediatric / Minor Patient Management 0 []  - 0 Isolation Patient Management []  - 0 Hearing / Language / Visual special needs []  - 0 Assessment of Community assistance (transportation, D/C planning, etc.) []  - 0 Additional assistance / Altered mentation []  - 0 Support Surface(s) Assessment (bed, cushion, seat, etc.) INTERVENTIONS - Wound Cleansing / Measurement Delarocha, Haynes L. ([id number]) X- 1 5 Simple Wound Cleansing - one wound []  - 0 Complex Wound Cleansing - multiple wounds X- 1 5 Wound Imaging (photographs - any number of wounds) []  - 0 Wound Tracing (instead of photographs) X- 1  5 Simple Wound Measurement - one wound []  - 0 Complex Wound Measurement - multiple wounds INTERVENTIONS - Wound Dressings []  - Small Wound  Dressing one or multiple wounds 0 X- 1 15 Medium Wound Dressing one or multiple wounds []  - 0 Large Wound Dressing one or multiple wounds []  - 0 Application of Medications - topical []  - 0 Application of Medications - injection INTERVENTIONS - Miscellaneous []  - External ear exam 0 []  - 0 Specimen Collection (cultures, biopsies, blood, body fluids, etc.) []  - 0 Specimen(s) / Culture(s) sent or taken to Lab for analysis []  - 0 Patient Transfer (multiple staff / Civil Service fast streamer / Similar devices) []  - 0 Simple Staple / Suture removal (25 or less) []  - 0 Complex Staple / Suture removal (26 or more) []  - 0 Hypo / Hyperglycemic Management (close monitor of Blood Glucose) []  - 0 Ankle / Brachial Index (ABI) - do not check if billed separately X- 1 5 Vital Signs Has the patient been seen at the hospital within the last three years: Yes Total Score: 90 Level Of Care: New/Established - Level 3 Electronic Signature(s) Signed: 07/13/2021 3:56:18 PM By: Gretta Cool, BSN, RN, CWS, Kim RN, BSN Entered By: Gretta Cool, BSN, RN, CWS, Kim on 07/13/2021 09:47:26 [name], [name] ([id number]) -------------------------------------------------------------------------------- Encounter Discharge Information Details Patient Name: [name], [name] L. Date of Service: 07/13/2021 9:00 AM Medical Record Number: [id number] Patient Account Number: [id number] Date of Birth/Sex: [birth year] (50 y.o. M) Treating RN: Cornell Barman Primary Care Montarius Kitagawa: PATIENT, NO Other Clinician: Referring Nishanth Mccaughan: Barkley Boards Treating Mashal Slavick/Extender: Skipper Cliche in Treatment: 26 Encounter Discharge Information Items Discharge Condition: Stable Ambulatory Status: Ambulatory Discharge Destination: Home Transportation: Private Auto Accompanied By: self Schedule Follow-up Appointment:  Yes Clinical Summary of Care: Electronic Signature(s) Signed: 07/13/2021 3:56:18 PM By: Gretta Cool, BSN, RN, CWS, Kim RN, BSN Entered By: Gretta Cool, BSN, RN, CWS, Kim on 07/13/2021 09:48:16 [name] ([id number]) -------------------------------------------------------------------------------- Lower Extremity Assessment Details Patient Name: [name], [name] Date of Service: 07/13/2021 9:00 AM Medical Record Number: [id number] Patient Account Number: [id number] Date of Birth/Sex: April 22, 1971 (50 y.o. M) Treating RN: Cornell Barman Primary Care Bexton Haak: PATIENT, NO Other Clinician: Referring Hosie Sharman: Barkley Boards Treating Zailah Zagami/Extender: Skipper Cliche in Treatment: 26 Edema Assessment Assessed: [Left: No] [Right: Yes] Edema: [Left: N] [Right: o] Vascular Assessment Pulses: Dorsalis Pedis Palpable: [Right:Yes] Electronic Signature(s) Signed: 07/13/2021 3:56:18 PM By: Gretta Cool, BSN, RN, CWS, Kim RN, BSN Entered By: Gretta Cool, BSN, RN, CWS, Kim on 07/13/2021 09:19:59 [name] ([id number]) -------------------------------------------------------------------------------- Multi Wound Chart Details Patient Name: [name], [name] L. Date of Service: 07/13/2021 9:00 AM Medical Record Number: [id number] Patient Account Number: [id number] Date of Birth/Sex: 09/23/1970 (50 y.o. M) Treating RN: Cornell Barman Primary Care Alfonse Garringer: PATIENT, NO Other Clinician: Referring Calen Posch: Barkley Boards Treating Rashad Auld/Extender: Skipper Cliche in Treatment: 26 Vital Signs Height(in): 78 Pulse(bpm): 74 Weight(lbs): 249 Blood Pressure(mmHg): 148/78 Body Mass Index(BMI): 29 Temperature(F): 97.9 Respiratory Rate(breaths/min): 16 Photos: [N/A:N/A] Wound Location: Right, Medial Lower Leg N/A N/A Wounding Event: Gradually Appeared N/A N/A Primary Etiology: Venous Leg Ulcer N/A N/A Date Acquired: 11/24/2020 N/A N/A Weeks of Treatment: 26 N/A N/A Wound Status: Open N/A N/A Measurements L x W x D (cm)  0.7x0.9x0.4 N/A N/A Area (cm) : 0.495 N/A N/A Volume (cm) : 0.198 N/A N/A % Reduction in Area: 41.60% N/A N/A % Reduction in Volume: -16.50% N/A N/A Classification: Full Thickness Without Exposed N/A N/A Support Structures Exudate Amount: Medium N/A N/A Exudate Type: Serosanguineous N/A N/A Exudate Color: red, brown N/A N/A Wound Margin: Flat and Intact N/A N/A Granulation Amount: None Present (0%) N/A N/A Necrotic Amount: Large (67-100%) N/A N/A  Exposed Structures: Fat Layer (Subcutaneous Tissue): N/A N/A Yes Fascia: No Tendon: No Muscle: No Joint: No Bone: No Epithelialization: Large (67-100%) N/A N/A Treatment Notes Electronic Signature(s) Signed: 07/13/2021 3:56:18 PM By: Gretta Cool, BSN, RN, CWS, Kim RN, BSN Entered By: Gretta Cool, BSN, RN, CWS, Kim on 07/13/2021 09:21:33 [name] ([id number]) -------------------------------------------------------------------------------- [place] Details Patient Name: [name], [name] L. Date of Service: 07/13/2021 9:00 AM Medical Record Number: [id number] Patient Account Number: [id number] Date of Birth/Sex: [birth year] (50 y.o. M) Treating RN: Cornell Barman Primary Care Haleigh Desmith: PATIENT, NO Other Clinician: Referring Zhavia Cunanan: Barkley Boards Treating Amariz Flamenco/Extender: Skipper Cliche in Treatment: 26 Active Inactive Necrotic Tissue Nursing Diagnoses: Impaired tissue integrity related to necrotic/devitalized tissue Knowledge deficit related to management of necrotic/devitalized tissue Goals: Necrotic/devitalized tissue will be minimized in the wound bed Date Initiated: 07/13/2021 Target Resolution Date: 07/13/2021 Goal Status: Active Patient/caregiver will verbalize understanding of reason and process for debridement of necrotic tissue Date Initiated: 07/13/2021 Target Resolution Date: 07/13/2021 Goal Status: Active Interventions: Assess patient pain level pre-, during and post procedure and prior to  discharge Provide education on necrotic tissue and debridement process Treatment Activities: Apply topical anesthetic as ordered : 07/13/2021 Notes: Electronic Signature(s) Signed: 07/13/2021 3:56:18 PM By: Gretta Cool, BSN, RN, CWS, Kim RN, BSN Entered By: Gretta Cool, BSN, RN, CWS, Kim on 07/13/2021 09:21:25 [name] ([id number]) -------------------------------------------------------------------------------- Pain Assessment Details Patient Name: [name], [name] L. Date of Service: 07/13/2021 9:00 AM Medical Record Number: [id number] Patient Account Number: [id number] Date of Birth/Sex: 1970/09/19 (50 y.o. M) Treating RN: Cornell Barman Primary Care Tyffani Foglesong: PATIENT, NO Other Clinician: Referring Arhaan Chesnut: Barkley Boards Treating Nitasha Jewel/Extender: Skipper Cliche in Treatment: 26 Active Problems Location of Pain Severity and Description of Pain Patient Has Paino No Site Locations Pain Management and Medication Current Pain Management: Electronic Signature(s) Signed: 07/13/2021 3:56:18 PM By: Gretta Cool, BSN, RN, CWS, Kim RN, BSN Entered By: Gretta Cool, BSN, RN, CWS, Kim on 07/13/2021 09:15:10 [name] ([id number]) -------------------------------------------------------------------------------- Patient/Caregiver Education Details Patient Name: [name] L. Date of Service: 07/13/2021 9:00 AM Medical Record Number: [id number] Patient Account Number: [id number] Date of Birth/Gender: 06-Oct-1970 (50 y.o. M) Treating RN: Cornell Barman Primary Care Physician: PATIENT, NO Other Clinician: Referring Physician: Barkley Boards Treating Physician/Extender: Skipper Cliche in Treatment: 26 Education Assessment Education Provided To: Patient Education Topics Provided Wound Debridement: Handouts: Wound Debridement Methods: Demonstration, Explain/Verbal Responses: State content correctly Electronic Signature(s) Signed: 07/13/2021 3:56:18 PM By: Gretta Cool, BSN, RN, CWS, Kim RN, BSN Entered By: Gretta Cool,  BSN, RN, CWS, Kim on 07/13/2021 09:47:46 [name], [name] ([id number]) -------------------------------------------------------------------------------- Wound Assessment Details Patient Name: [name], [name] L. Date of Service: 07/13/2021 9:00 AM Medical Record Number: [id number] Patient Account Number: [id number] Date of Birth/Sex: 1971-02-14 (50 y.o. M) Treating RN: Cornell Barman Primary Care Aalani Aikens: PATIENT, NO Other Clinician: Referring Lyndel Sarate: Barkley Boards Treating Deone Omahoney/Extender: Skipper Cliche in Treatment: 26 Wound Status Wound Number: 1 Primary Etiology: Venous Leg Ulcer Wound Location: Right, Medial Lower Leg Wound Status: Open Wounding Event: Gradually Appeared Date Acquired: 11/24/2020 Weeks Of Treatment: 26 Clustered Wound: No Photos Wound Measurements Length: (cm) 0.7 Width: (cm) 0.9 Depth: (cm) 0.4 Area: (cm) 0.495 Volume: (cm) 0.198 % Reduction in Area: 41.6% % Reduction in Volume: -16.5% Epithelialization: Large (67-100%) Tunneling: No Undermining: No Wound Description Classification: Full Thickness Without Exposed Support Structu Wound Margin: Flat and Intact Exudate Amount: Medium Exudate Type: Serosanguineous Exudate Color: red, brown res Foul Odor After Cleansing: No Slough/Fibrino Yes Wound Bed Granulation Amount: None Present (0%)  Exposed Structure Necrotic Amount: Large (67-100%) Fascia Exposed: No Necrotic Quality: Adherent Slough Fat Layer (Subcutaneous Tissue) Exposed: Yes Tendon Exposed: No Muscle Exposed: No Joint Exposed: No Bone Exposed: No Treatment Notes Wound #1 (Lower Leg) Wound Laterality: Right, Medial Cleanser Soap and Water Discharge Instruction: Gently cleanse wound with antibacterial soap, rinse and pat dry prior to dressing wounds Peri-Wound Care [place], [employer]. ([id number]) Topical Primary Dressing Prisma 4.34 (in) Discharge Instruction: Moisten w/normal saline or sterile water; Cover wound as directed. Do not  remove from wound bed. Secondary Dressing Telfa Adhesive [place] Dressing, 4x4 (in/in) Discharge Instruction: Apply over dressing to secure in place. Secured With Compression Wrap Compression Stockings Environmental education officer) Signed: 07/13/2021 3:56:18 PM By: Gretta Cool, BSN, RN, CWS, Kim RN, BSN Entered By: Gretta Cool, BSN, RN, CWS, Kim on 07/13/2021 09:19:35 [name] ([id number]) -------------------------------------------------------------------------------- Vitals Details Patient Name: [name] L. Date of Service: 07/13/2021 9:00 AM Medical Record Number: [id number] Patient Account Number: [id number] Date of Birth/Sex: October 10, 1970 (50 y.o. M) Treating RN: Cornell Barman Primary Care Syreeta Figler: PATIENT, NO Other Clinician: Referring Draxton Luu: Barkley Boards Treating Augustus Zurawski/Extender: Skipper Cliche in Treatment: 26 Vital Signs Time Taken: 09:14 Temperature (F): 97.9 Height (in): 78 Pulse (bpm): 62 Weight (lbs): 249 Respiratory Rate (breaths/min): 16 Body Mass Index (BMI): 28.8 Blood Pressure (mmHg): 148/78 Reference Range: 80 - 120 mg / dl Electronic Signature(s) Signed: 07/13/2021 3:56:18 PM By: Gretta Cool, BSN, RN, CWS, Kim RN, BSN Entered By: Gretta Cool, BSN, RN, CWS, Kim on 07/13/2021 09:15:04

## 2021-07-13 NOTE — Progress Notes (Addendum)
DONTELL, MIAN ([id number]) Visit Report for 07/13/2021 Chief Complaint Document Details Patient Name: [name], [name]. Date of Service: 07/13/2021 9:00 AM Medical Record Number: [id number] Patient Account Number: [id number] Date of Birth/Sex: 08-Mar-1971 (50 y.o. M) Treating RN: Cornell Barman Primary Care Provider: PATIENT, NO Other Clinician: Referring Provider: Barkley Boards Treating Provider/Extender: Skipper Cliche in Treatment: 26 Information Obtained from: Patient Chief Complaint Right LE Ulcer Electronic Signature(s) Signed: 07/13/2021 9:17:33 AM By: Worthy Keeler PA-C Entered By: Worthy Keeler on 07/13/2021 09:17:33 [place], [place] ([id number]) -------------------------------------------------------------------------------- HPI Details Patient Name: Nicolas Cousin L. Date of Service: 07/13/2021 9:00 AM Medical Record Number: [id number] Patient Account Number: [id number] Date of Birth/Sex: 19-Apr-1971 (50 y.o. M) Treating RN: Cornell Barman Primary Care Provider: PATIENT, NO Other Clinician: Referring Provider: Barkley Boards Treating Provider/Extender: Skipper Cliche in Treatment: 26 History of Present Illness HPI Description: 01/12/2021 upon evaluation today patient appears to be doing somewhat poorly in regard to a wound on his right medial lower leg. Currently he tells me that this has been going on since around the beginning of April. He has done a telehealth visit with a physician who initially gave him a triamcinolone cream along with Keflex for 5 days. Subsequently he ended up being seen at walk-in urgent care where they also given Keflex for 7 days that seem to be doing better for him. Overall he tells me that things have improved from the standpoint of redness. Fortunately there does not appear to be any signs of systemic infection to be honest right now even locally I do not see any evidence of infection right now. He does have 1 main wound with some scattering areas  that look like they may have been small ulcerations but again for the most part these have filled in. This does look almost to be more of a vasculitis type scenario based on what I am seeing. Patient does have chronic venous insufficiency but is not wearing any compression even though it sounds like he has been told before he should. 5/27; patient with small wounds on his right medial ankle. He has chronic venous insufficiency and stasis dermatitis a large open wound and several smaller areas all of which looks better than on presentation last week according to her nursing staff 01/26/2021 upon evaluation today patient appears to be doing well with regard to his wound. He is making good progress. With that being said there is some need currently for sharp debridement in regard to the wound there is some necrotic debris it is almost completely cleaned off but not completely. He is in agreement with that plan today. Fortunately there does not appear to be any evidence of active infection which is great and I am very pleased in that regard. He still wishes he did not have to wear the compression wrap. He did get his compression socks from [facility] therapy though they are in the 15 to 20 mmHg range they did not have any his length as he is a tall guy in the 20 to 30 mmHg range. 02/02/2021 upon evaluation today patient appears to be doing excellent in regard to his leg ulcer. He has been tolerating the dressing changes without complication. Fortunately there is no evidence of active infection at this time. No fevers, chills, nausea, vomiting, or diarrhea. 02/09/2021 upon evaluation today patient's wound actually showing signs of improvement. Fortunately there does not appear to be any evidence of active infection which is great news overall very pleased with where  things stand today. 02/16/2021 upon evaluation today patient's wound is showing some signs of improvement. He did have a fairly aggressive debridement  last week he notes that he had quite a bit of discomfort he was some swelling following. With that being said I think this was to be expected considering what we had to do from a debridement standpoint. The good news is there does not appear to be any evidence of infection currently nonetheless I do believe that the patient may have some signs of vasculitis here. Based on the overall appearance of the wound today and beginning to suspect this. For that reason I think he would benefit from starting him on triamcinolone ointment to the wound bed and some of the immediate periwound to try to help out in this regard. 02/23/2021 upon evaluation today patient appears to be doing well with regard to his wound. He is actually making some progress here. The wound is roughly about the same size but the overall appearance of the wound bed is much improved. I do not see any signs of active infection at this time which is great news. No fevers, chills, nausea, vomiting, or diarrhea. 03/02/2021 upon evaluation today patient appears to be doing well with regard to his wound. I think the biggest issue I see right now is that with Hydrofera Blue this wound is staying somewhat dry. I think he could benefit from the use of collagen to try to help with getting this to stimulate some additional growth. He is in agreement with the plan. I think this also had a little bit more moisture which would be helpful at this point. 03/09/2021 on evaluation today patient appears to be doing about the same in regard to his wound is measuring a little bit smaller but still he does have the surface of the wound which is not quite as healthy as I would like to see. Fortunately there does not appear to be any active infection at this time. No fevers, chills, nausea, vomiting, or diarrhea. 03/16/2021 upon evaluation today patient appears to be doing better with regard to the overall appearance of his wound bed. There does not appear to be any  signs of significant slough buildup I think the Iodoflex has done very well for him. The patient's happy to hear this and see the improvement. With that being said he is continuing to have a little bit of expansion of the wound again I think this is more related to #1 edema control and #2 the fact that again we will get the wound bed clean. I feel like were pretty much today as far as cleaning up the wound bed is concerned now we just need to make sure the edema is optimized. I am just not certain that his compression socks are doing the job. 03/23/2021 upon evaluation today patient unfortunately appears to be doing a little bit worse with regard to his wound I was actually expecting this to be much better this week. With that being said I am getting concerned about the possibility of pyoderma gangrenosum. I did print off some information for the patient in this regard. Subsequently I am also can see about starting him on some steroid cream as well as an oral steroid. 03/30/2021 upon evaluation today patient appears to be doing significantly better in regard to his wound. He has started using the clobetasol which seems to have made a excellent improvement in regard to the wound today. 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 Either way at this point I would recommend that we avoid sharp debridements I think that is probably can be the best option based on what I am seeing currently. The patient voiced understanding. He also had questions about taking the prednisone with regard to his immune system. I explained that I do believe that that can be an issue long-term but for short-term which is mainly what we are doing here I do not think it is going to be a major issue for him and in fact I think would help more especially considering  the pyoderma here and the fact that is more of an autoimmune type issue than not using the prednisone. 04/06/2021 upon evaluation today patient's wound actually showed signs of excellent improvement. This definitely has gone a lot better since we started with the topical steroids and overall I am extremely pleased with where things stand. No fevers, chills, nausea, vomiting, or diarrhea. With that being said the patient continues to feel much better and states that the pain is actually dramatically improved as well. This definitely appears Parvin, Stetzer Winthrop ([id number]) to be a pyoderma situation. 04/13/2021 upon evaluation today patient appears to be doing well with regard to his wound. Fortunately there does not appear to be any signs of active infection at this time. No fevers, chills, nausea, vomiting, or diarrhea. With that being said I do believe that he is overall doing extremely well. This is measuring smaller and does seem to be headed in the right direction. 04/20/2021 upon evaluation today patient appears to be doing well with regard to his wound. He still has quite a bit of slough covering the surface of the wound. With that being said we previously debrided the wound he actually had a bit of worsening and therefore I am somewhat reluctant to proceed with any further debridement at this point. In fact this got quite a bit larger postdebridement. 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 Nonetheless I think it is can to take time to get this to heal. 04/27/2021 upon evaluation today patient appears to be doing well with regard to his wound. This is measuring smaller this week yet again and overall I am extremely pleased with where we stand currently. Fortunately there does not appear to be any evidence of active infection which is great news. No fevers, chills, nausea, vomiting, or diarrhea. 05/04/2021 upon evaluation today patient's wound actually has a significant amount of biofilm  noted on the surface of the wound. Fortunately there does not appear to be any evidence of active infection at this time which is great news and overall I am extremely pleased with where we stand today. However I do think we may need to try to see about a sharp debridement to clear away some of the slough and biofilm on the surface of the wound to try to help this to heal more effectively and quickly. Patient voiced understanding. 05/11/2021 upon evaluation today patient appears to be doing better in regard to his wound. He has been tolerating the dressing changes without complication. Fortunately there does not appear to be any signs of active infection at this time which is great news. No fever chills noted 05/25/2021 upon evaluation today patient's wound is actually showing signs of excellent improvement. Fortunately there does not appear to  be any evidence of infection at this time which is great news and overall I am extremely pleased with where we stand currently. I do feel like that the patient is improving although is very slow where little by little seen in this improvement. 06/01/2021 upon evaluation today patient appears to be doing well with regard to his wound. She has been tolerating the dressing changes without complication. With that being said it somewhat slow to heal at this point. I do think we could try to switch things up a little bit and see about using collagen in place that what we have been doing. The patient is in agreement with that plan. 06/08/2021 upon evaluation today patient appears to be doing well with regard to his wound I do feel like the collagen is helping with a little additional granulation. Fortunately there does not appear to be any signs of infection which is great news. No fevers, chills, nausea, vomiting, or diarrhea. 06/15/2021 upon evaluation today patient appears to be doing well with regard to his wound. In fact this is showing signs of improvement  is going slowly but nonetheless making good progress in my opinion. I do not see any signs of active infection at this time. No fevers, chills, nausea, vomiting, or diarrhea. 06/29/2021 upon evaluation today patient appears to be doing well with regard to his leg ulcer. He has been tolerating the dressing changes without complication. Fortunately there does not appear to be any evidence of active infection which is great and overall I am extremely pleased with where we stand today. He still is using the clobetasol just a small amount over top of the collagen. 07/13/2021 patient's wound bed actually showed signs currently of doing okay there does not appear to be any signs of infection which is great news. No fevers, chills, nausea, vomiting, or diarrhea. Electronic Signature(s) Signed: 07/13/2021 5:32:45 PM By: Worthy Keeler PA-C Entered By: Worthy Keeler on 07/13/2021 17:32:45 Sethi, Alesandro Carlean Jews ([id number]) -------------------------------------------------------------------------------- Physical Exam Details Patient Name: BENEDETTO, RYDER L. Date of Service: 07/13/2021 9:00 AM Medical Record Number: [id number] Patient Account Number: [id number] Date of Birth/Sex: Jul 09, 1971 (50 y.o. M) Treating RN: Cornell Barman Primary Care Provider: PATIENT, NO Other Clinician: Referring Provider: Barkley Boards Treating Provider/Extender: Skipper Cliche in Treatment: 66 Constitutional Well-nourished and well-hydrated in no acute distress. Respiratory normal breathing without difficulty. Psychiatric this patient is able to make decisions and demonstrates good insight into disease process. Alert and Oriented x 3. pleasant and cooperative. Notes Patient's wound currently showed signs visually of looking a little better although measurement wise this is not significantly smaller. I think the measurements may just be a little different depending on who is measuring this. Nonetheless overall I feel like we are  making good progress here. Electronic Signature(s) Signed: 07/13/2021 5:33:41 PM By: Worthy Keeler PA-C Entered By: Worthy Keeler on 07/13/2021 17:33:40 [name] ([id number]) -------------------------------------------------------------------------------- Physician Orders Details Patient Name: KIYAN, BURMESTER L. Date of Service: 07/13/2021 9:00 AM Medical Record Number: [id number] Patient Account Number: [id number] Date of Birth/Sex: September 15, 1970 (50 y.o. M) Treating RN: Cornell Barman Primary Care Provider: PATIENT, NO Other Clinician: Referring Provider: Barkley Boards Treating Provider/Extender: Skipper Cliche in Treatment: 26 Verbal / Phone Orders: No Diagnosis Coding ICD-10 Coding Code Description I87.2 Venous insufficiency (chronic) (peripheral) L97.812 Non-pressure chronic ulcer of other part of right lower leg with fat layer exposed Follow-up Appointments o Return Appointment in 2 weeks. Bathing/ Shower/ Hygiene o May shower; gently cleanse wound  with antibacterial soap, rinse and pat dry prior to dressing wounds o No tub bath. o Other: - avoid hot tubs, pools and oceans Edema Control - Lymphedema / Segmental Compressive Device / Other Bilateral Lower Extremities o Patient to wear own compression stockings. Remove compression stockings every night before going to bed and put on every morning when getting up. o Elevate, Exercise Daily and Avoid Standing for Long Periods of Time. o Elevate legs to the level of the heart and pump ankles as often as possible o Elevate leg(s) parallel to the floor when sitting. o DO YOUR BEST to sleep in the bed at night. DO NOT sleep in your recliner. Long hours of sitting in a recliner leads to swelling of the legs and/or potential wounds on your backside. Additional Orders / Instructions o Follow Nutritious Diet and Increase Protein Intake Wound Treatment Wound #1 - Lower Leg Wound Laterality: Right,  Medial Cleanser: Soap and Water Every Other Day/30 Days Discharge Instructions: Gently cleanse wound with antibacterial soap, rinse and pat dry prior to dressing wounds Primary Dressing: Prisma 4.34 (in) Every Other Day/30 Days Discharge Instructions: Moisten w/normal saline or sterile water; Cover wound as directed. Do not remove from wound bed. Secondary Dressing: Crowley Dressing, 4x4 (in/in) Every Other Day/30 Days Discharge Instructions: Apply over dressing to secure in place. Electronic Signature(s) Signed: 07/13/2021 3:56:18 PM By: Gretta Cool, BSN, RN, CWS, Kim RN, BSN Signed: 07/13/2021 5:42:25 PM By: Worthy Keeler PA-C Entered By: Gretta Cool BSN, RN, CWS, Kim on 07/13/2021 09:44:42 Teeple, Della Goo ([id number]) -------------------------------------------------------------------------------- Problem List Details Patient Name: TRISTAN, PROTO. Date of Service: 07/13/2021 9:00 AM Medical Record Number: [id number] Patient Account Number: [id number] Date of Birth/Sex: 1971/01/13 (50 y.o. M) Treating RN: Cornell Barman Primary Care Provider: PATIENT, NO Other Clinician: Referring Provider: Barkley Boards Treating Provider/Extender: Skipper Cliche in Treatment: 26 Active Problems ICD-10 Encounter Code Description Active Date MDM Diagnosis I87.2 Venous insufficiency (chronic) (peripheral) 01/12/2021 No Yes L97.812 Non-pressure chronic ulcer of other part of right lower leg with fat layer 01/12/2021 No Yes exposed Inactive Problems Resolved Problems Electronic Signature(s) Signed: 07/13/2021 9:17:12 AM By: Worthy Keeler PA-C Entered By: Worthy Keeler on 07/13/2021 [place], [place]. ([id number]) -------------------------------------------------------------------------------- Progress Note Details Patient Name: Nicolas Cousin L. Date of Service: 07/13/2021 9:00 AM Medical Record Number: [id number] Patient Account Number: [id number] Date of Birth/Sex: 01-Dec-1970 (50 y.o.  M) Treating RN: Cornell Barman Primary Care Provider: PATIENT, NO Other Clinician: Referring Provider: Barkley Boards Treating Provider/Extender: Skipper Cliche in Treatment: 26 Subjective Chief Complaint Information obtained from Patient Right LE Ulcer History of Present Illness (HPI) 01/12/2021 upon evaluation today patient appears to be doing somewhat poorly in regard to a wound on his right medial lower leg. Currently he tells me that this has been going on since around the beginning of April. He has done a telehealth visit with a physician who initially gave him a triamcinolone cream along with Keflex for 5 days. Subsequently he ended up being seen at walk-in urgent care where they also given Keflex for 7 days that seem to be doing better for him. Overall he tells me that things have improved from the standpoint of redness. Fortunately there does not appear to be any signs of systemic infection to be honest right now even locally I do not see any evidence of infection right now. He does have 1 main wound with some scattering areas that look like they may have been small ulcerations but  again for the most part these have filled in. This does look almost to be more of a vasculitis type scenario based on what I am seeing. Patient does have chronic venous insufficiency but is not wearing any compression even though it sounds like he has been told before he should. 5/27; patient with small wounds on his right medial ankle. He has chronic venous insufficiency and stasis dermatitis a large open wound and several smaller areas all of which looks better than on presentation last week according to her nursing staff 01/26/2021 upon evaluation today patient appears to be doing well with regard to his wound. He is making good progress. With that being said there is some need currently for sharp debridement in regard to the wound there is some necrotic debris it is almost completely cleaned off but not  completely. He is in agreement with that plan today. Fortunately there does not appear to be any evidence of active infection which is great and I am very pleased in that regard. He still wishes he did not have to wear the compression wrap. He did get his compression socks from [facility] therapy though they are in the 15 to 20 mmHg range they did not have any his length as he is a tall guy in the 20 to 30 mmHg range. 02/02/2021 upon evaluation today patient appears to be doing excellent in regard to his leg ulcer. He has been tolerating the dressing changes without complication. Fortunately there is no evidence of active infection at this time. No fevers, chills, nausea, vomiting, or diarrhea. 02/09/2021 upon evaluation today patient's wound actually showing signs of improvement. Fortunately there does not appear to be any evidence of active infection which is great news overall very pleased with where things stand today. 02/16/2021 upon evaluation today patient's wound is showing some signs of improvement. He did have a fairly aggressive debridement last week he notes that he had quite a bit of discomfort he was some swelling following. With that being said I think this was to be expected considering what we had to do from a debridement standpoint. The good news is there does not appear to be any evidence of infection currently nonetheless I do believe that the patient may have some signs of vasculitis here. Based on the overall appearance of the wound today and beginning to suspect this. For that reason I think he would benefit from starting him on triamcinolone ointment to the wound bed and some of the immediate periwound to try to help out in this regard. 02/23/2021 upon evaluation today patient appears to be doing well with regard to his wound. He is actually making some progress here. The wound is roughly about the same size but the overall appearance of the wound bed is much improved. I do not see any  signs of active infection at this time which is great news. No fevers, chills, nausea, vomiting, or diarrhea. 03/02/2021 upon evaluation today patient appears to be doing well with regard to his wound. I think the biggest issue I see right now is that with Hydrofera Blue this wound is staying somewhat dry. I think he could benefit from the use of collagen to try to help with getting this to stimulate some additional growth. He is in agreement with the plan. I think this also had a little bit more moisture which would be helpful at this point. 03/09/2021 on evaluation today patient appears to be doing about the same in regard to his wound is  measuring a little bit smaller but still he does have the surface of the wound which is not quite as healthy as I would like to see. Fortunately there does not appear to be any active infection at this time. No fevers, chills, nausea, vomiting, or diarrhea. 03/16/2021 upon evaluation today patient appears to be doing better with regard to the overall appearance of his wound bed. There does not appear to be any signs of significant slough buildup I think the Iodoflex has done very well for him. The patient's happy to hear this and see the improvement. With that being said he is continuing to have a little bit of expansion of the wound again I think this is more related to #1 edema control and #2 the fact that again we will get the wound bed clean. I feel like were pretty much today as far as cleaning up the wound bed is concerned now we just need to make sure the edema is optimized. I am just not certain that his compression socks are doing the job. 03/23/2021 upon evaluation today patient unfortunately appears to be doing a little bit worse with regard to his wound I was actually expecting this to be much better this week. With that being said I am getting concerned about the possibility of pyoderma gangrenosum. I did print off some information for the patient in this  regard. Subsequently I am also can see about starting him on some steroid cream as well as an oral steroid. 03/30/2021 upon evaluation today patient appears to be doing significantly better in regard to his wound. He has started using the clobetasol which seems to have made a excellent improvement in regard to the wound today. 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 Either way at this point I would recommend that we avoid sharp debridements I think that is probably can be the best option based on what I am seeing currently. The patient voiced understanding. He also had questions about taking the prednisone with regard to his immune system. 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 04/06/2021 upon evaluation today patient's wound actually showed signs of excellent improvement. This definitely has gone a lot better since we started with the topical steroids and overall I am extremely pleased with where things stand. No fevers, chills, nausea, vomiting, or diarrhea. With that being said the patient continues to feel much better and states that the pain is actually dramatically improved as well. 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 04/13/2021 upon evaluation today patient appears to be doing well with regard to his wound. Fortunately there does not appear to be any signs of active infection at this time. No fevers, chills, nausea, vomiting, or diarrhea. With that being said I do believe that he is  overall doing extremely well. This is measuring smaller and does seem to be headed in the right direction. 04/20/2021 upon evaluation today patient appears to be doing  well with regard to his wound. He still has quite a bit of slough covering the surface of the wound. With that being said we previously debrided the wound he actually had a bit of worsening and therefore I am somewhat reluctant to proceed with any further debridement at this point. In fact this got quite a bit larger postdebridement. 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 Nonetheless I think it is can to take time to get this to heal. 04/27/2021 upon evaluation today patient appears to be doing well with regard to his wound. This is measuring smaller this week yet again and overall I am extremely pleased with where we stand currently. Fortunately there does not appear to be any evidence of active infection which is great news. No fevers, chills, nausea, vomiting, or diarrhea. 05/04/2021 upon evaluation today patient's wound actually has a significant amount of biofilm noted on the surface of the wound. Fortunately there does not appear to be any evidence of active infection at this time which is great news and overall I am extremely pleased with where we stand today. However I do think we may need to try to see about a sharp debridement to clear away some of the slough and biofilm on the surface of the wound to try to help this to heal more effectively and quickly. Patient voiced understanding. 05/11/2021 upon evaluation today patient appears to be doing better in regard to his wound. He has been tolerating the dressing changes without complication. Fortunately there does not appear to be any signs of active infection at this time which is great news. No fever chills noted 05/25/2021 upon evaluation today patient's wound is actually showing signs of excellent improvement. Fortunately there does not appear to be any evidence of  infection at this time which is great news and overall I am extremely pleased with where we stand currently. I do feel like that the patient is improving although is very slow where little by little seen in this improvement. 06/01/2021 upon evaluation today patient appears to be doing well with regard to his wound. She has been tolerating the dressing changes without complication. With that being said it somewhat slow to heal at this point. I do think we could try to switch things up a little bit and see about using collagen in place that what we have been doing. The patient is in agreement with that plan. 06/08/2021 upon evaluation today patient appears to be doing well with regard to his wound I do feel like the collagen is helping with a little additional granulation. Fortunately there does not appear to be any signs of infection which is great news. No fevers, chills, nausea, vomiting, or diarrhea. 06/15/2021 upon evaluation today patient appears to be doing well with regard to his wound. In fact this is showing signs of improvement is going slowly but nonetheless making good progress in my opinion. I do not see any signs of active infection at this time. No fevers, chills, nausea, vomiting, or diarrhea. 06/29/2021 upon evaluation today patient appears to be doing well with regard to his leg ulcer. He has been tolerating the dressing changes without complication. Fortunately there does not appear to be any evidence of active infection which is great and overall I am extremely pleased with where we stand today. He still is using the clobetasol just a small amount over top of the collagen. 07/13/2021 patient's wound bed actually showed signs currently of doing okay there does not appear to  be any signs of infection which is great news. No fevers, chills, nausea, vomiting, or diarrhea. Objective Constitutional Well-nourished and well-hydrated in no acute distress. Vitals Time Taken: 9:14 AM,  Height: 78 in, Weight: 249 lbs, BMI: 28.8, Temperature: 97.9 F, Pulse: 62 bpm, Respiratory Rate: 16 breaths/min, Blood Pressure: 148/78 mmHg. Respiratory normal breathing without difficulty. Psychiatric this patient is able to make decisions and demonstrates good insight into disease process. Alert and Oriented x 3. pleasant and cooperative. General Notes: Patient's wound currently showed signs visually of looking a little better although measurement wise this is not significantly smaller. I think the measurements may just be a little different depending on who is measuring this. Nonetheless overall I feel like we are making good progress here. CALISE, Pritchett ([id number]) Integumentary (Hair, Skin) Wound #1 status is Open. Original cause of wound was Gradually Appeared. The date acquired was: 11/24/2020. The wound has been in treatment 26 weeks. The wound is located on the Right,Medial Lower Leg. The wound measures 0.7cm length x 0.9cm width x 0.4cm depth; 0.495cm^2 area and 0.198cm^3 volume. There is Fat Layer (Subcutaneous Tissue) exposed. There is no tunneling or undermining noted. There is a medium amount of serosanguineous drainage noted. The wound margin is flat and intact. There is no granulation within the wound bed. There is a large (67-100%) amount of necrotic tissue within the wound bed including Adherent Slough. Assessment Active Problems ICD-10 Venous insufficiency (chronic) (peripheral) Non-pressure chronic ulcer of other part of right lower leg with fat layer exposed Plan Follow-up Appointments: Return Appointment in 2 weeks. Bathing/ Shower/ Hygiene: May shower; gently cleanse wound with antibacterial soap, rinse and pat dry prior to dressing wounds No tub bath. Other: - avoid hot tubs, pools and oceans Edema Control - Lymphedema / Segmental Compressive Device / Other: Patient to wear own compression stockings. Remove compression stockings every night before going to bed  and put on every morning when getting up. Elevate, Exercise Daily and Avoid Standing for Long Periods of Time. Elevate legs to the level of the heart and pump ankles as often as possible Elevate leg(s) parallel to the floor when sitting. DO YOUR BEST to sleep in the bed at night. DO NOT sleep in your recliner. Long hours of sitting in a recliner leads to swelling of the legs and/or potential wounds on your backside. Additional Orders / Instructions: Follow Nutritious Diet and Increase Protein Intake WOUND #1: - Lower Leg Wound Laterality: Right, Medial Cleanser: Soap and Water Every Other Day/30 Days Discharge Instructions: Gently cleanse wound with antibacterial soap, rinse and pat dry prior to dressing wounds Primary Dressing: Prisma 4.34 (in) Every Other Day/30 Days Discharge Instructions: Moisten w/normal saline or sterile water; Cover wound as directed. Do not remove from wound bed. Secondary Dressing: [place] Dressing, 4x4 (in/in) Every Other Day/30 Days Discharge Instructions: Apply over dressing to secure in place. 1. Would recommend currently that we discontinue the use of the steroid cream at this point. He is in agreement with doing this and give it a try over the next week. 2. Would recommend that we continue with the silver collagen which I think is still a good option here. 3. I am also can recommend the patient continue to monitor for any signs of worsening or infection if anything changes he should let me know. We will see patient back for reevaluation in 1 week here in the clinic. If anything worsens or changes patient will contact our office for additional recommendations. Electronic  Signature(s) Signed: 07/13/2021 5:34:09 PM By: Worthy Keeler PA-C Entered By: Worthy Keeler on 07/13/2021 17:34:08 Headley, Alyssa Carlean Jews ([id number]) -------------------------------------------------------------------------------- SuperBill Details Patient Name: Nicolas Cousin  L. Date of Service: 07/13/2021 Medical Record Number: [id number] Patient Account Number: [id number] Date of Birth/Sex: Oct 30, 1970 (50 y.o. M) Treating RN: Cornell Barman Primary Care Provider: PATIENT, NO Other Clinician: Referring Provider: Barkley Boards Treating Provider/Extender: Skipper Cliche in Treatment: 26 Diagnosis Coding ICD-10 Codes Code Description I87.2 Venous insufficiency (chronic) (peripheral) L97.812 Non-pressure chronic ulcer of other part of right lower leg with fat layer exposed Facility Procedures CPT4 Code: 73403709 Description: [facility] VISIT-LEV 3 EST PT Modifier: Quantity: 1 Physician Procedures CPT4 Code: 6438381 Description: 84037 - WC PHYS LEVEL 3 - EST PT Modifier: Quantity: 1 CPT4 Code: Description: ICD-10 Diagnosis Description I87.2 Venous insufficiency (chronic) (peripheral) L97.812 Non-pressure chronic ulcer of other part of right lower leg with fat la Modifier: yer exposed Quantity: Electronic Signature(s) Signed: 07/13/2021 5:35:46 PM By: Worthy Keeler PA-C Previous Signature: 07/13/2021 3:56:18 PM Version By: Gretta Cool, BSN, RN, CWS, Kim RN, BSN Entered By: Worthy Keeler on 07/13/2021 17:35:46

## 2021-07-27 ENCOUNTER — Other Ambulatory Visit: Payer: Self-pay

## 2021-07-27 ENCOUNTER — Encounter: Payer: Self-pay | Attending: Physician Assistant | Admitting: Physician Assistant

## 2021-07-27 DIAGNOSIS — L97812 Non-pressure chronic ulcer of other part of right lower leg with fat layer exposed: Secondary | ICD-10-CM | POA: Insufficient documentation

## 2021-07-27 DIAGNOSIS — I872 Venous insufficiency (chronic) (peripheral): Secondary | ICD-10-CM | POA: Insufficient documentation

## 2021-07-27 NOTE — Progress Notes (Addendum)
JAYDENCE, ARNESEN ([id number]) Visit Report for 07/27/2021 Chief Complaint Document Details Patient Name: [name], [name]. Date of Service: 07/27/2021 9:00 AM Medical Record Number: [id number] Patient Account Number: [id number] Date of Birth/Sex: May 23, 1971 (50 y.o. M) Treating RN: Cornell Barman Primary Care Provider: PATIENT, [name] Other Clinician: Levora Dredge Referring Provider: Barkley Boards Treating Provider/Extender: Skipper Cliche in Treatment: 28 Information Obtained from: Patient Chief Complaint Right LE Ulcer Electronic Signature(s) Signed: 07/27/2021 9:31:56 AM By: Worthy Keeler PA-C Entered By: Worthy Keeler on 07/27/2021 09:31:56 [place], [place] ([id number]) -------------------------------------------------------------------------------- HPI Details Patient Name: Nicolas Cousin L. Date of Service: 07/27/2021 9:00 AM Medical Record Number: [id number] Patient Account Number: [id number] Date of Birth/Sex: October 08, 1970 (50 y.o. M) Treating RN: Cornell Barman Primary Care Provider: PATIENT, [name] Other Clinician: Levora Dredge Referring Provider: Barkley Boards Treating Provider/Extender: Skipper Cliche in Treatment: 28 History of Present Illness HPI Description: 01/12/2021 upon evaluation today patient appears to be doing somewhat poorly in regard to a wound on his right medial lower leg. Currently he tells me that this has been going on since around the beginning of April. He has done a telehealth visit with a physician who initially gave him a triamcinolone cream along with Keflex for 5 days. Subsequently he ended up being seen at walk-in urgent care where they also given Keflex for 7 days that seem to be doing better for him. Overall he tells me that things have improved from the standpoint of redness. Fortunately there does not appear to be any signs of systemic infection to be honest right now even locally I do not see any evidence of infection right now. He does have 1 main wound  with some scattering areas that look like they may have been small ulcerations but again for the most part these have filled in. This does look almost to be more of a vasculitis type scenario based on what I am seeing. Patient does have chronic venous insufficiency but is not wearing any compression even though it sounds like he has been told before he should. 5/27; patient with small wounds on his right medial ankle. He has chronic venous insufficiency and stasis dermatitis a large open wound and several smaller areas all of which looks better than on presentation last week according to her nursing staff 01/26/2021 upon evaluation today patient appears to be doing well with regard to his wound. He is making good progress. With that being said there is some need currently for sharp debridement in regard to the wound there is some necrotic debris it is almost completely cleaned off but not completely. He is in agreement with that plan today. Fortunately there does not appear to be any evidence of active infection which is great and I am very pleased in that regard. He still wishes he did not have to wear the compression wrap. He did get his compression socks from elastic therapy though they are in the 15 to 20 mmHg range they did not have any his length as he is a tall guy in the 20 to 30 mmHg range. 02/02/2021 upon evaluation today patient appears to be doing excellent in regard to his leg ulcer. He has been tolerating the dressing changes without complication. Fortunately there is [name] evidence of active infection at this time. [name] fevers, chills, nausea, vomiting, or diarrhea. 02/09/2021 upon evaluation today patient's wound actually showing signs of improvement. Fortunately there does not appear to be any evidence of active infection which is great news overall  very pleased with where things stand today. 02/16/2021 upon evaluation today patient's wound is showing some signs of improvement. He did have a  fairly aggressive debridement last week he notes that he had quite a bit of discomfort he was some swelling following. With that being said I think this was to be expected considering what we had to do from a debridement standpoint. The good news is there does not appear to be any evidence of infection currently nonetheless I do believe that the patient may have some signs of vasculitis here. Based on the overall appearance of the wound today and beginning to suspect this. For that reason I think he would benefit from starting him on triamcinolone ointment to the wound bed and some of the immediate periwound to try to help out in this regard. 02/23/2021 upon evaluation today patient appears to be doing well with regard to his wound. He is actually making some progress here. The wound is roughly about the same size but the overall appearance of the wound bed is much improved. I do not see any signs of active infection at this time which is great news. [name] fevers, chills, nausea, vomiting, or diarrhea. 03/02/2021 upon evaluation today patient appears to be doing well with regard to his wound. I think the biggest issue I see right now is that with Hydrofera Blue this wound is staying somewhat dry. I think he could benefit from the use of collagen to try to help with getting this to stimulate some additional growth. He is in agreement with the plan. I think this also had a little bit more moisture which would be helpful at this point. 03/09/2021 on evaluation today patient appears to be doing about the same in regard to his wound is measuring a little bit smaller but still he does have the surface of the wound which is not quite as healthy as I would like to see. Fortunately there does not appear to be any active infection at this time. [name] fevers, chills, nausea, vomiting, or diarrhea. 03/16/2021 upon evaluation today patient appears to be doing better with regard to the overall appearance of his wound bed.  There does not appear to be any signs of significant slough buildup I think the Iodoflex has done very well for him. The patient's happy to hear this and see the improvement. With that being said he is continuing to have a little bit of expansion of the wound again I think this is more related to #1 edema control and #2 the fact that again we will get the wound bed clean. I feel like were pretty much today as far as cleaning up the wound bed is concerned now we just need to make sure the edema is optimized. I am just not certain that his compression socks are doing the job. 03/23/2021 upon evaluation today patient unfortunately appears to be doing a little bit worse with regard to his wound I was actually expecting this to be much better this week. With that being said I am getting concerned about the possibility of pyoderma gangrenosum. I did print off some information for the patient in this regard. Subsequently I am also can see about starting him on some steroid cream as well as an oral steroid. 03/30/2021 upon evaluation today patient appears to be doing significantly better in regard to his wound. He has started using the clobetasol which seems to have made a excellent improvement in regard to the wound today. 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 Either way at this point I would recommend that we avoid sharp debridements I think that is probably can be the best option based on what I am seeing currently. The patient voiced understanding. He also had questions about taking the prednisone with regard to his immune system. I explained that I do believe that that can be an issue long-term but for short-term which is mainly what we are doing here I do not think it is going to be a major issue for him and in fact I think would  help more especially considering the pyoderma here and the fact that is more of an autoimmune type issue than not using the prednisone. 04/06/2021 upon evaluation today patient's wound actually showed signs of excellent improvement. This definitely has gone a lot better since we started with the topical steroids and overall I am extremely pleased with where things stand. [name] fevers, chills, nausea, vomiting, or diarrhea. With that being said the patient continues to feel much better and states that the pain is actually dramatically improved as well. This definitely appears Trevaughn, [place]ar [place] ([id number]) to be a pyoderma situation. 04/13/2021 upon evaluation today patient appears to be doing well with regard to his wound. Fortunately there does not appear to be any signs of active infection at this time. [name] fevers, chills, nausea, vomiting, or diarrhea. With that being said I do believe that he is overall doing extremely well. This is measuring smaller and does seem to be headed in the right direction. 04/20/2021 upon evaluation today patient appears to be doing well with regard to his wound. He still has quite a bit of slough covering the surface of the wound. With that being said we previously debrided the wound he actually had a bit of worsening and therefore I am somewhat reluctant to proceed with any further debridement at this point. In fact this got quite a bit larger postdebridement. 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 Nonetheless I think it is can to take time to get this to heal. 04/27/2021 upon evaluation today patient appears to be doing well with regard to his wound. This is measuring smaller this week yet again and overall I am extremely pleased with where we stand currently. Fortunately there does not appear to be any evidence of active infection which is great news. [name] fevers, chills, nausea, vomiting, or diarrhea. 05/04/2021 upon evaluation today patient's wound actually has  a significant amount of biofilm noted on the surface of the wound. Fortunately there does not appear to be any evidence of active infection at this time which is great news and overall I am extremely pleased with where we stand today. However I do think we may need to try to see about a sharp debridement to clear away some of the slough and biofilm on the surface of the wound to try to help this to heal more effectively and quickly. Patient voiced understanding. 05/11/2021 upon evaluation today patient appears to be doing better in regard to his wound. He has been tolerating the dressing changes without complication. Fortunately there does not appear to be any signs of active infection at this time which is great news. [name] fever chills noted 05/25/2021 upon evaluation today patient's wound is actually showing signs of excellent improvement. Fortunately there  does not appear to be any evidence of infection at this time which is great news and overall I am extremely pleased with where we stand currently. I do feel like that the patient is improving although is very slow where little by little seen in this improvement. 06/01/2021 upon evaluation today patient appears to be doing well with regard to his wound. She has been tolerating the dressing changes without complication. With that being said it somewhat slow to heal at this point. I do think we could try to switch things up a little bit and see about using collagen in place that what we have been doing. The patient is in agreement with that plan. 06/08/2021 upon evaluation today patient appears to be doing well with regard to his wound I do feel like the collagen is helping with a little additional granulation. Fortunately there does not appear to be any signs of infection which is great news. [name] fevers, chills, nausea, vomiting, or diarrhea. 06/15/2021 upon evaluation today patient appears to be doing well with regard to his wound. In fact this is  showing signs of improvement is going slowly but nonetheless making good progress in my opinion. I do not see any signs of active infection at this time. [name] fevers, chills, nausea, vomiting, or diarrhea. 06/29/2021 upon evaluation today patient appears to be doing well with regard to his leg ulcer. He has been tolerating the dressing changes without complication. Fortunately there does not appear to be any evidence of active infection which is great and overall I am extremely pleased with where we stand today. He still is using the clobetasol just a small amount over top of the collagen. 07/13/2021 patient's wound bed actually showed signs currently of doing okay there does not appear to be any signs of infection which is great news. [name] fevers, chills, nausea, vomiting, or diarrhea. 07/27/2021 upon evaluation today patient's wound is actually showing signs of improvement which is great news. This is measuring smaller significantly and looks much better. I think stopping the clobetasol at this point was a good call. Obviously he is making good progress. The collagen does seem to be doing an awesome job. Electronic Signature(s) Signed: 07/27/2021 9:45:26 AM By: Worthy Keeler PA-C Entered By: Worthy Keeler on 07/27/2021 09:45:25 Fringer, Hermen Carlean Jews ([id number]) -------------------------------------------------------------------------------- Physical Exam Details Patient Name: JONTY, MORRICAL L. Date of Service: 07/27/2021 9:00 AM Medical Record Number: [id number] Patient Account Number: [id number] Date of Birth/Sex: 1970-11-05 (50 y.o. M) Treating RN: Cornell Barman Primary Care Provider: PATIENT, [name] Other Clinician: Levora Dredge Referring Provider: Barkley Boards Treating Provider/Extender: Skipper Cliche in Treatment: 59 Constitutional Well-nourished and well-hydrated in [name] acute distress. Respiratory normal breathing without difficulty. Psychiatric this patient is able to make decisions and  demonstrates good insight into disease process. Alert and Oriented x 3. pleasant and cooperative. Notes Patient's wound bed did not require any sharp debridement there was some slough I used saline and gauze with a sterile Q-tip to try to clean out as much as I could I am still little bit leery about sharp debridement due to the fact the last time we did this it stimulated it to worsen and get larger so were just trying to avoid any debridement here if at all possible. Electronic Signature(s) Signed: 07/27/2021 9:45:47 AM By: Worthy Keeler PA-C Entered By: Worthy Keeler on 07/27/2021 09:45:46 Londo, Trumaine Carlean Jews ([id number]) -------------------------------------------------------------------------------- Physician Orders Details Patient Name: NAEEM, QUILLIN L. Date of Service: 07/27/2021 9:00 AM Medical  Record Number: [id number] Patient Account Number: [id number] Date of Birth/Sex: April 23, 1971 (50 y.o. M) Treating RN: Cornell Barman Primary Care Provider: PATIENT, [name] Other Clinician: Levora Dredge Referring Provider: Barkley Boards Treating Provider/Extender: Skipper Cliche in Treatment: 28 Verbal / Phone Orders: [name] Diagnosis Coding ICD-10 Coding Code Description I87.2 Venous insufficiency (chronic) (peripheral) L97.812 Non-pressure chronic ulcer of other part of right lower leg with fat layer exposed Follow-up Appointments o Return Appointment in 2 weeks. Bathing/ Shower/ Hygiene o May shower; gently cleanse wound with antibacterial soap, rinse and pat dry prior to dressing wounds o [name] tub bath. o Other: - avoid hot tubs, pools and oceans Edema Control - Lymphedema / Segmental Compressive Device / Other Bilateral Lower Extremities o Patient to wear own compression stockings. Remove compression stockings every night before going to bed and put on every morning when getting up. o Elevate, Exercise Daily and Avoid Standing for Long Periods of Time. o Elevate legs to the  level of the heart and pump ankles as often as possible o Elevate leg(s) parallel to the floor when sitting. o DO YOUR BEST to sleep in the bed at night. DO NOT sleep in your recliner. Long hours of sitting in a recliner leads to swelling of the legs and/or potential wounds on your backside. Additional Orders / Instructions o Follow Nutritious Diet and Increase Protein Intake Wound Treatment Wound #1 - Lower Leg Wound Laterality: Right, Medial Cleanser: Soap and Water Every Other Day/30 Days Discharge Instructions: Gently cleanse wound with antibacterial soap, rinse and pat dry prior to dressing wounds Primary Dressing: Prisma 4.34 (in) Every Other Day/30 Days Discharge Instructions: Moisten w/normal saline or sterile water; Cover wound as directed. Do not remove from wound bed. Secondary Dressing: Evant Dressing, 4x4 (in/in) Every Other Day/30 Days Discharge Instructions: Apply over dressing to secure in place. Electronic Signature(s) Signed: 07/27/2021 4:38:54 PM By: Gretta Cool, BSN, RN, CWS, Kim RN, BSN Signed: 07/27/2021 5:38:58 PM By: Worthy Keeler PA-C Entered By: Gretta Cool BSN, RN, CWS, Kim on 07/27/2021 09:39:24 [name] ([id number]) -------------------------------------------------------------------------------- Problem List Details Patient Name: YUUKI, SKEENS L. Date of Service: 07/27/2021 9:00 AM Medical Record Number: [id number] Patient Account Number: [id number] Date of Birth/Sex: 1971/07/02 (50 y.o. M) Treating RN: Cornell Barman Primary Care Provider: PATIENT, [name] Other Clinician: Levora Dredge Referring Provider: Barkley Boards Treating Provider/Extender: Skipper Cliche in Treatment: 28 Active Problems ICD-10 Encounter Code Description Active Date MDM Diagnosis I87.2 Venous insufficiency (chronic) (peripheral) 01/12/2021 [name] Yes L97.812 Non-pressure chronic ulcer of other part of right lower leg with fat layer 01/12/2021 [name] Yes exposed Inactive  Problems Resolved Problems Electronic Signature(s) Signed: 07/27/2021 9:31:45 AM By: Worthy Keeler PA-C Entered By: Worthy Keeler on 07/27/2021 09:31:45 Ramseyer, Orlander L. ([id number]) -------------------------------------------------------------------------------- Progress Note Details Patient Name: Nicolas Cousin L. Date of Service: 07/27/2021 9:00 AM Medical Record Number: [id number] Patient Account Number: [id number] Date of Birth/Sex: [birth year] (50 y.o. M) Treating RN: Cornell Barman Primary Care Provider: PATIENT, [name] Other Clinician: Levora Dredge Referring Provider: Barkley Boards Treating Provider/Extender: Skipper Cliche in Treatment: 28 Subjective Chief Complaint Information obtained from Patient Right LE Ulcer History of Present Illness (HPI) 01/12/2021 upon evaluation today patient appears to be doing somewhat poorly in regard to a wound on his right medial lower leg. Currently he tells me that this has been going on since around the beginning of April. He has done a telehealth visit with a physician who initially gave him a triamcinolone cream along with Keflex  for 5 days. Subsequently he ended up being seen at walk-in urgent care where they also given Keflex for 7 days that seem to be doing better for him. Overall he tells me that things have improved from the standpoint of redness. Fortunately there does not appear to be any signs of systemic infection to be honest right now even locally I do not see any evidence of infection right now. He does have 1 main wound with some scattering areas that look like they may have been small ulcerations but again for the most part these have filled in. This does look almost to be more of a vasculitis type scenario based on what I am seeing. Patient does have chronic venous insufficiency but is not wearing any compression even though it sounds like he has been told before he should. 5/27; patient with small wounds on his right medial ankle.  He has chronic venous insufficiency and stasis dermatitis a large open wound and several smaller areas all of which looks better than on presentation last week according to her nursing staff 01/26/2021 upon evaluation today patient appears to be doing well with regard to his wound. He is making good progress. With that being said there is some need currently for sharp debridement in regard to the wound there is some necrotic debris it is almost completely cleaned off but not completely. He is in agreement with that plan today. Fortunately there does not appear to be any evidence of active infection which is great and I am very pleased in that regard. He still wishes he did not have to wear the compression wrap. He did get his compression socks from elastic therapy though they are in the 15 to 20 mmHg range they did not have any his length as he is a tall guy in the 20 to 30 mmHg range. 02/02/2021 upon evaluation today patient appears to be doing excellent in regard to his leg ulcer. He has been tolerating the dressing changes without complication. Fortunately there is [name] evidence of active infection at this time. [name] fevers, chills, nausea, vomiting, or diarrhea. 02/09/2021 upon evaluation today patient's wound actually showing signs of improvement. Fortunately there does not appear to be any evidence of active infection which is great news overall very pleased with where things stand today. 02/16/2021 upon evaluation today patient's wound is showing some signs of improvement. He did have a fairly aggressive debridement last week he notes that he had quite a bit of discomfort he was some swelling following. With that being said I think this was to be expected considering what we had to do from a debridement standpoint. The good news is there does not appear to be any evidence of infection currently nonetheless I do believe that the patient may have some signs of vasculitis here. Based on the overall  appearance of the wound today and beginning to suspect this. For that reason I think he would benefit from starting him on triamcinolone ointment to the wound bed and some of the immediate periwound to try to help out in this regard. 02/23/2021 upon evaluation today patient appears to be doing well with regard to his wound. He is actually making some progress here. The wound is roughly about the same size but the overall appearance of the wound bed is much improved. I do not see any signs of active infection at this time which is great news. [name] fevers, chills, nausea, vomiting, or diarrhea. 03/02/2021 upon evaluation today patient appears to be  doing well with regard to his wound. I think the biggest issue I see right now is that with Hydrofera Blue this wound is staying somewhat dry. I think he could benefit from the use of collagen to try to help with getting this to stimulate some additional growth. He is in agreement with the plan. I think this also had a little bit more moisture which would be helpful at this point. 03/09/2021 on evaluation today patient appears to be doing about the same in regard to his wound is measuring a little bit smaller but still he does have the surface of the wound which is not quite as healthy as I would like to see. Fortunately there does not appear to be any active infection at this time. [name] fevers, chills, nausea, vomiting, or diarrhea. 03/16/2021 upon evaluation today patient appears to be doing better with regard to the overall appearance of his wound bed. There does not appear to be any signs of significant slough buildup I think the Iodoflex has done very well for him. The patient's happy to hear this and see the improvement. With that being said he is continuing to have a little bit of expansion of the wound again I think this is more related to #1 edema control and #2 the fact that again we will get the wound bed clean. I feel like were pretty much today as far as  cleaning up the wound bed is concerned now we just need to make sure the edema is optimized. I am just not certain that his compression socks are doing the job. 03/23/2021 upon evaluation today patient unfortunately appears to be doing a little bit worse with regard to his wound I was actually expecting this to be much better this week. With that being said I am getting concerned about the possibility of pyoderma gangrenosum. I did print off some information for the patient in this regard. Subsequently I am also can see about starting him on some steroid cream as well as an oral steroid. 03/30/2021 upon evaluation today patient appears to be doing significantly better in regard to his wound. He has started using the clobetasol which seems to have made a excellent improvement in regard to the wound today. 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 Either way at this point I would recommend that we avoid sharp debridements I think that is probably can be the best option based on what I am seeing currently. The patient voiced understanding. He also had questions about taking the prednisone with regard to his immune system. I explained that I do believe that that can be an issue long-term but for short-term which is mainly what we are doing here I do not think it is going to be a major issue for him and in fact I think would help more especially considering the pyoderma here and the fact that is more of an [place], [place] ([id number]) autoimmune type issue than not using the prednisone. 04/06/2021 upon evaluation today patient's wound actually showed signs of excellent improvement. This definitely has gone a lot better since we started with the topical steroids and overall I am extremely pleased with where things stand. [name] fevers, chills,  nausea, vomiting, or diarrhea. With that being said the patient continues to feel much better and states  that the pain is actually dramatically improved as well. 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 04/13/2021 upon evaluation today patient appears to be doing well with regard to his wound. Fortunately there does not appear to be any signs of active infection at this time. [name] fevers, chills, nausea, vomiting, or diarrhea. With that being said I do believe that he is overall doing extremely well. This is measuring smaller and does seem to be headed in the right direction. 04/20/2021 upon evaluation today patient appears to be doing well with regard to his wound. He still has quite a bit of slough covering the surface of the wound. With that being said we previously debrided the wound he actually had a bit of worsening and therefore I am somewhat reluctant to proceed with any further debridement at this point. In fact this got quite a bit larger postdebridement. 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 Nonetheless I think it is can to take time to get this to heal. 04/27/2021 upon evaluation today patient appears to be doing well with regard to his wound. This is measuring smaller this week yet again and overall I am extremely pleased with where we stand currently. Fortunately there does not appear to be any evidence of active infection which is great news. [name] fevers, chills, nausea, vomiting, or diarrhea. 05/04/2021 upon evaluation today patient's wound actually has a significant amount of biofilm noted on the surface of the wound. Fortunately there does not appear to be any evidence of active infection at this time which is great news and overall I am extremely pleased with where we stand today. However I do think we may need to try to see about a sharp debridement to clear away some of the slough and biofilm on the surface of the wound to try to help this to heal more  effectively and quickly. Patient voiced understanding. 05/11/2021 upon evaluation today patient appears to be doing better in regard to his wound. He has been tolerating the dressing changes without complication. Fortunately there does not appear to be any signs of active infection at this time which is great news. [name] fever chills noted 05/25/2021 upon evaluation today patient's wound is actually showing signs of excellent improvement. Fortunately there does not appear to be any evidence of infection at this time which is great news and overall I am extremely pleased with where we stand currently. I do feel like that the patient is improving although is very slow where little by little seen in this improvement. 06/01/2021 upon evaluation today patient appears to be doing well with regard to his wound. She has been tolerating the dressing changes without complication. With that being said it somewhat slow to heal at this point. I do think we could try to switch things up a little bit and see about using collagen in place that what we have been doing. The patient is in agreement with that plan. 06/08/2021 upon evaluation today patient appears to be doing well with regard to his wound I do feel like the collagen is helping with a little additional granulation. Fortunately there does not appear to be any signs of infection which is great news. [name] fevers, chills, nausea, vomiting, or diarrhea. 06/15/2021 upon evaluation today patient appears to be doing well with regard to his wound. In fact this is showing signs of improvement is going slowly but nonetheless making good progress in my opinion. I do not see any signs of active infection at  this time. [name] fevers, chills, nausea, vomiting, or diarrhea. 06/29/2021 upon evaluation today patient appears to be doing well with regard to his leg ulcer. He has been tolerating the dressing changes without complication. Fortunately there does not appear to be any  evidence of active infection which is great and overall I am extremely pleased with where we stand today. He still is using the clobetasol just a small amount over top of the collagen. 07/13/2021 patient's wound bed actually showed signs currently of doing okay there does not appear to be any signs of infection which is great news. [name] fevers, chills, nausea, vomiting, or diarrhea. 07/27/2021 upon evaluation today patient's wound is actually showing signs of improvement which is great news. This is measuring smaller significantly and looks much better. I think stopping the clobetasol at this point was a good call. Obviously he is making good progress. The collagen does seem to be doing an awesome job. Objective Constitutional Well-nourished and well-hydrated in [name] acute distress. Vitals Time Taken: 9:17 AM, Height: 78 in, Weight: 249 lbs, BMI: 28.8, Temperature: 97.6 F, Pulse: 65 bpm, Respiratory Rate: 18 breaths/min, Blood Pressure: 126/80 mmHg. Respiratory normal breathing without difficulty. Psychiatric this patient is able to make decisions and demonstrates good insight into disease process. Alert and Oriented x 3. pleasant and cooperative. VITAL, Cledis L. ([id number]) General Notes: Patient's wound bed did not require any sharp debridement there was some slough I used saline and gauze with a sterile Q-tip to try to clean out as much as I could I am still little bit leery about sharp debridement due to the fact the last time we did this it stimulated it to worsen and get larger so were just trying to avoid any debridement here if at all possible. Integumentary (Hair, Skin) Wound #1 status is Open. Original cause of wound was Gradually Appeared. The date acquired was: 11/24/2020. The wound has been in treatment 28 weeks. The wound is located on the Right,Medial Lower Leg. The wound measures 0.6cm length x 0.4cm width x 0.2cm depth; 0.188cm^2 area and 0.038cm^3 volume. There is Fat Layer  (Subcutaneous Tissue) exposed. There is a medium amount of serosanguineous drainage noted. The wound margin is flat and intact. There is small (1-33%) red granulation within the wound bed. There is a large (67-100%) amount of necrotic tissue within the wound bed including Adherent Slough. Assessment Active Problems ICD-10 Venous insufficiency (chronic) (peripheral) Non-pressure chronic ulcer of other part of right lower leg with fat layer exposed Plan Follow-up Appointments: Return Appointment in 2 weeks. Bathing/ Shower/ Hygiene: May shower; gently cleanse wound with antibacterial soap, rinse and pat dry prior to dressing wounds [name] tub bath. Other: - avoid hot tubs, pools and oceans Edema Control - Lymphedema / Segmental Compressive Device / Other: Patient to wear own compression stockings. Remove compression stockings every night before going to bed and put on every morning when getting up. Elevate, Exercise Daily and Avoid Standing for Long Periods of Time. Elevate legs to the level of the heart and pump ankles as often as possible Elevate leg(s) parallel to the floor when sitting. DO YOUR BEST to sleep in the bed at night. DO NOT sleep in your recliner. Long hours of sitting in a recliner leads to swelling of the legs and/or potential wounds on your backside. Additional Orders / Instructions: Follow Nutritious Diet and Increase Protein Intake WOUND #1: - Lower Leg Wound Laterality: Right, Medial Cleanser: Soap and Water Every Other Day/30 Days Discharge Instructions: Gently cleanse  wound with antibacterial soap, rinse and pat dry prior to dressing wounds Primary Dressing: Prisma 4.34 (in) Every Other Day/30 Days Discharge Instructions: Moisten w/normal saline or sterile water; Cover wound as directed. Do not remove from wound bed. Secondary Dressing: [place] Dressing, 4x4 (in/in) Every Other Day/30 Days Discharge Instructions: Apply over dressing to secure in  place. 1. Would recommend currently that we going to continue with the wound care measures as before and the patient is in agreement with plan this includes the use of the silver collagen which I think is doing a great job. 2. I am also can recommend that we have the patient continue with a Makanda dressing to cover is doing a good job there. 3. He can put some AandD ointment just a little bit around the edges of the wound as long as his dressing was still stick I think that is okay. We will see patient back for reevaluation in 2 weeks here in the clinic. If anything worsens or changes patient will contact our office for additional recommendations. Electronic Signature(s) Signed: 07/27/2021 9:46:14 AM By: Worthy Keeler PA-C Entered By: Worthy Keeler on 07/27/2021 09:46:13 Sandell, Estell Carlean Jews ([id number]) -------------------------------------------------------------------------------- SuperBill Details Patient Name: Nicolas Cousin L. Date of Service: 07/27/2021 Medical Record Number: [id number] Patient Account Number: [id number] Date of Birth/Sex: May 29, 1971 (50 y.o. M) Treating RN: Cornell Barman Primary Care Provider: PATIENT, [name] Other Clinician: Levora Dredge Referring Provider: Barkley Boards Treating Provider/Extender: Skipper Cliche in Treatment: 28 Diagnosis Coding ICD-10 Codes Code Description I87.2 Venous insufficiency (chronic) (peripheral) L97.812 Non-pressure chronic ulcer of other part of right lower leg with fat layer exposed Facility Procedures CPT4 Code: [id number] Description: [phone] - WOUND CARE VISIT-LEV 2 EST PT Modifier: Quantity: 1 Physician Procedures CPT4 Code: [id number] Description: 24580 - WC PHYS LEVEL 3 - EST PT Modifier: Quantity: 1 CPT4 Code: Description: ICD-10 Diagnosis Description I87.2 Venous insufficiency (chronic) (peripheral) L97.812 Non-pressure chronic ulcer of other part of right lower leg with fat la Modifier: yer exposed Quantity: Electronic  Signature(s) Signed: 07/27/2021 9:46:26 AM By: Worthy Keeler PA-C Entered By: Worthy Keeler on 07/27/2021 09:46:26

## 2021-07-27 NOTE — Progress Notes (Signed)
[name], [name] ([id number]) Visit Report for 07/27/2021 Arrival Information Details Patient Name: [name], [name]. Date of Service: 07/27/2021 9:00 AM Medical Record Number: [id number] Patient Account Number: [id number] Date of Birth/Sex: [birth year] (50 y.o. M) Treating RN: Cornell Barman Primary Care Elisabetta Mishra: PATIENT, NO Other Clinician: Levora Dredge Referring Latoya Maulding: Barkley Boards Treating Mettie Roylance/Extender: Skipper Cliche in Treatment: 29 Visit Information History Since Last Visit Added or deleted any medications: No Patient Arrived: Ambulatory Any new allergies or adverse reactions: No Arrival Time: 09:16 Had a fall or experienced change in No Accompanied By: self activities of daily living that may affect Transfer Assistance: None risk of falls: Patient Identification Verified: Yes Hospitalized since last visit: No Secondary Verification Process Completed: Yes Pain Present Now: No Patient Requires Transmission-Based Precautions: No Patient Has Alerts: Yes Patient Alerts: NOT DIABETIC Electronic Signature(s) Signed: 07/27/2021 4:38:54 PM By: Gretta Cool, BSN, RN, CWS, Kim RN, BSN Entered By: Gretta Cool, BSN, RN, CWS, Kim on 07/27/2021 09:17:15 [name] ([id number]) -------------------------------------------------------------------------------- Clinic Level of Care Assessment Details Patient Name: [name], [name] Date of Service: 07/27/2021 9:00 AM Medical Record Number: [id number] Patient Account Number: [id number] Date of Birth/Sex: 1971/03/07 (50 y.o. M) Treating RN: Cornell Barman Primary Care Jenisse Vullo: PATIENT, NO Other Clinician: Levora Dredge Referring Kaiyden Simkin: Barkley Boards Treating Shariq Puig/Extender: Skipper Cliche in Treatment: 28 Clinic Level of Care Assessment Items TOOL 4 Quantity Score []  - Use when only an EandM is performed on FOLLOW-UP visit 0 ASSESSMENTS - Nursing Assessment / Reassessment []  - Reassessment of Co-morbidities (includes updates in  patient status) 0 []  - 0 Reassessment of Adherence to Treatment Plan ASSESSMENTS - Wound and Skin Assessment / Reassessment X - Simple Wound Assessment / Reassessment - one wound 1 5 []  - 0 Complex Wound Assessment / Reassessment - multiple wounds []  - 0 Dermatologic / Skin Assessment (not related to wound area) ASSESSMENTS - Focused Assessment []  - Circumferential Edema Measurements - multi extremities 0 []  - 0 Nutritional Assessment / Counseling / Intervention X- 1 5 Lower Extremity Assessment (monofilament, tuning fork, pulses) []  - 0 Peripheral Arterial Disease Assessment (using hand held doppler) ASSESSMENTS - Ostomy and/or Continence Assessment and Care []  - Incontinence Assessment and Management 0 []  - 0 Ostomy Care Assessment and Management (repouching, etc.) PROCESS - Coordination of Care X - Simple Patient / Family Education for ongoing care 1 15 []  - 0 Complex (extensive) Patient / Family Education for ongoing care []  - 0 Staff obtains Programmer, systems, Records, Test Results / Process Orders []  - 0 Staff telephones HHA, Nursing Homes / Clarify orders / etc []  - 0 Routine Transfer to another Facility (non-emergent condition) []  - 0 Routine Hospital Admission (non-emergent condition) []  - 0 New Admissions / Biomedical engineer / Ordering NPWT, Apligraf, etc. []  - 0 Emergency Hospital Admission (emergent condition) []  - 0 Simple Discharge Coordination []  - 0 Complex (extensive) Discharge Coordination PROCESS - Special Needs []  - Pediatric / Minor Patient Management 0 []  - 0 Isolation Patient Management []  - 0 Hearing / Language / Visual special needs []  - 0 Assessment of Community assistance (transportation, D/C planning, etc.) []  - 0 Additional assistance / Altered mentation []  - 0 Support Surface(s) Assessment (bed, cushion, seat, etc.) INTERVENTIONS - Wound Cleansing / Measurement Neumann, Mickel L. ([id number]) X- 1 5 Simple Wound Cleansing - one  wound []  - 0 Complex Wound Cleansing - multiple wounds []  - 0 Wound Imaging (photographs - any number of wounds) []  - 0 Wound Tracing (instead of photographs) []  -  0 Simple Wound Measurement - one wound []  - 0 Complex Wound Measurement - multiple wounds INTERVENTIONS - Wound Dressings X - Small Wound Dressing one or multiple wounds 1 10 []  - 0 Medium Wound Dressing one or multiple wounds []  - 0 Large Wound Dressing one or multiple wounds []  - 0 Application of Medications - topical []  - 0 Application of Medications - injection INTERVENTIONS - Miscellaneous []  - External ear exam 0 []  - 0 Specimen Collection (cultures, biopsies, blood, body fluids, etc.) []  - 0 Specimen(s) / Culture(s) sent or taken to Lab for analysis []  - 0 Patient Transfer (multiple staff / Civil Service fast streamer / Similar devices) []  - 0 Simple Staple / Suture removal (25 or less) []  - 0 Complex Staple / Suture removal (26 or more) []  - 0 Hypo / Hyperglycemic Management (close monitor of Blood Glucose) []  - 0 Ankle / Brachial Index (ABI) - do not check if billed separately []  - 0 Vital Signs Has the patient been seen at the hospital within the last three years: Yes Total Score: 40 Level Of Care: New/Established - Level 2 Electronic Signature(s) Signed: 07/27/2021 4:38:54 PM By: Gretta Cool, BSN, RN, CWS, Kim RN, BSN Entered By: Gretta Cool, BSN, RN, CWS, Kim on 07/27/2021 [place], [place] ([id number]) -------------------------------------------------------------------------------- Encounter Discharge Information Details Patient Name: [name], [name] Date of Service: 07/27/2021 9:00 AM Medical Record Number: [id number] Patient Account Number: [id number] Date of Birth/Sex: [birth year] (51 y.o. M) Treating RN: Cornell Barman Primary Care Edwing Figley: PATIENT, NO Other Clinician: Levora Dredge Referring Jayme Mednick: Barkley Boards Treating Boneta Standre/Extender: Skipper Cliche in Treatment: 28 Encounter Discharge Information  Items Discharge Condition: Stable Ambulatory Status: Ambulatory Discharge Destination: Home Transportation: Private Auto Accompanied By: self Schedule Follow-up Appointment: Yes Clinical Summary of Care: Electronic Signature(s) Signed: 07/27/2021 4:38:54 PM By: Gretta Cool, BSN, RN, CWS, Kim RN, BSN Entered By: Gretta Cool, BSN, RN, CWS, Kim on 07/27/2021 09:44:49 [name] ([id number]) -------------------------------------------------------------------------------- Lower Extremity Assessment Details Patient Name: [name], [name] Date of Service: 07/27/2021 9:00 AM Medical Record Number: [id number] Patient Account Number: [id number] Date of Birth/Sex: 1971/07/27 (50 y.o. M) Treating RN: Cornell Barman Primary Care Grayson White: PATIENT, NO Other Clinician: Levora Dredge Referring Rainey Rodger: Barkley Boards Treating Taniyah Ballow/Extender: Skipper Cliche in Treatment: 28 Vascular Assessment Pulses: Dorsalis Pedis Palpable: [Right:Yes] Electronic Signature(s) Signed: 07/27/2021 4:38:54 PM By: Gretta Cool, BSN, RN, CWS, Kim RN, BSN Entered By: Gretta Cool, BSN, RN, CWS, Kim on 07/27/2021 09:27:45 [name] ([id number]) -------------------------------------------------------------------------------- Multi Wound Chart Details Patient Name: [name], [name] Date of Service: 07/27/2021 9:00 AM Medical Record Number: [id number] Patient Account Number: [id number] Date of Birth/Sex: 12-02-1970 (50 y.o. M) Treating RN: Cornell Barman Primary Care Sarena Jezek: PATIENT, NO Other Clinician: Levora Dredge Referring Lee-Ann Gal: Barkley Boards Treating Cayle Cordoba/Extender: Skipper Cliche in Treatment: 28 Vital Signs Height(in): 78 Pulse(bpm): 85 Weight(lbs): 249 Blood Pressure(mmHg): 126/80 Body Mass Index(BMI): 29 Temperature(F): 97.6 Respiratory Rate(breaths/min): 18 Photos: [N/A:N/A] Wound Location: Right, Medial Lower Leg N/A N/A Wounding Event: Gradually Appeared N/A N/A Primary Etiology: Venous Leg Ulcer N/A  N/A Date Acquired: 11/24/2020 N/A N/A Weeks of Treatment: 28 N/A N/A Wound Status: Open N/A N/A Measurements L x W x D (cm) 0.6x0.4x0.2 N/A N/A Area (cm) : 0.188 N/A N/A Volume (cm) : 0.038 N/A N/A % Reduction in Area: 77.80% N/A N/A % Reduction in Volume: 77.60% N/A N/A Classification: Full Thickness Without Exposed N/A N/A Support Structures Exudate Amount: Medium N/A N/A Exudate Type: Serosanguineous N/A N/A Exudate Color: red, brown N/A N/A Wound  Margin: Flat and Intact N/A N/A Granulation Amount: Small (1-33%) N/A N/A Granulation Quality: Red N/A N/A Necrotic Amount: Large (67-100%) N/A N/A Exposed Structures: Fat Layer (Subcutaneous Tissue): N/A N/A Yes Fascia: No Tendon: No Muscle: No Joint: No Bone: No Epithelialization: Large (67-100%) N/A N/A Treatment Notes Electronic Signature(s) Signed: 07/27/2021 4:38:54 PM By: Gretta Cool, BSN, RN, CWS, Kim RN, BSN Entered By: Gretta Cool, BSN, RN, CWS, Kim on 07/27/2021 09:32:41 [name] ([id number]) -------------------------------------------------------------------------------- [place] Details Patient Name: [name], [name] Date of Service: 07/27/2021 9:00 AM Medical Record Number: [id number] Patient Account Number: [id number] Date of Birth/Sex: May 16, 1971 (50 y.o. M) Treating RN: Cornell Barman Primary Care Meet [place]: PATIENT, NO Other Clinician: Levora Dredge Referring Jessenya Berdan: Barkley Boards Treating Victorhugo Preis/Extender: Skipper Cliche in Treatment: 28 Active Inactive Necrotic Tissue Nursing Diagnoses: Impaired tissue integrity related to necrotic/devitalized tissue Knowledge deficit related to management of necrotic/devitalized tissue Goals: Necrotic/devitalized tissue will be minimized in the wound bed Date Initiated: 07/13/2021 Target Resolution Date: 07/13/2021 Goal Status: Active Patient/caregiver will verbalize understanding of reason and process for debridement of necrotic tissue Date  Initiated: 07/13/2021 Target Resolution Date: 07/13/2021 Goal Status: Active Interventions: Assess patient pain level pre-, during and post procedure and prior to discharge Provide education on necrotic tissue and debridement process Treatment Activities: Apply topical anesthetic as ordered : 07/13/2021 Notes: Electronic Signature(s) Signed: 07/27/2021 4:38:54 PM By: Gretta Cool, BSN, RN, CWS, Kim RN, BSN Entered By: Gretta Cool, BSN, RN, CWS, Kim on 07/27/2021 09:32:25 [place], Della Goo ([id number]) -------------------------------------------------------------------------------- Pain Assessment Details Patient Name: [name], [name] Date of Service: 07/27/2021 9:00 AM Medical Record Number: [id number] Patient Account Number: [id number] Date of Birth/Sex: [birth year] (50 y.o. M) Treating RN: Cornell Barman Primary Care Damyah Gugel: PATIENT, NO Other Clinician: Levora Dredge Referring Katesha Eichel: Barkley Boards Treating Michell Kader/Extender: Skipper Cliche in Treatment: 28 Active Problems Location of Pain Severity and Description of Pain Patient Has Paino No Site Locations Rate the pain. Current Pain Level: 0 Pain Management and Medication Current Pain Management: Electronic Signature(s) Signed: 07/27/2021 4:38:54 PM By: Gretta Cool, BSN, RN, CWS, Kim RN, BSN Entered By: Gretta Cool, BSN, RN, CWS, Kim on 07/27/2021 09:19:23 [name] ([id number]) -------------------------------------------------------------------------------- Patient/Caregiver Education Details Patient Name: [name], [name] Date of Service: 07/27/2021 9:00 AM Medical Record Number: [id number] Patient Account Number: [id number] Date of Birth/Gender: Oct 29, 1970 (50 y.o. M) Treating RN: Cornell Barman Primary Care Physician: PATIENT, NO Other Clinician: Levora Dredge Referring Physician: Barkley Boards Treating Physician/Extender: Skipper Cliche in Treatment: 28 Education Assessment Education Provided To: Patient Education Topics  Provided Wound/Skin Impairment: Handouts: Caring for Your Ulcer Methods: Explain/Verbal Responses: State content correctly Electronic Signature(s) Signed: 07/27/2021 4:38:54 PM By: Gretta Cool, BSN, RN, CWS, Kim RN, BSN Entered By: Gretta Cool, BSN, RN, CWS, Kim on 07/27/2021 09:43:40 [name] ([id number]) -------------------------------------------------------------------------------- Wound Assessment Details Patient Name: [name], [name] Date of Service: 07/27/2021 9:00 AM Medical Record Number: [id number] Patient Account Number: [id number] Date of Birth/Sex: 1971/03/20 (50 y.o. M) Treating RN: Cornell Barman Primary Care Saintclair Schroader: PATIENT, NO Other Clinician: Levora Dredge Referring Nikesh Teschner: Barkley Boards Treating Nikeria Kalman/Extender: Skipper Cliche in Treatment: 28 Wound Status Wound Number: 1 Primary Etiology: Venous Leg Ulcer Wound Location: Right, Medial Lower Leg Wound Status: Open Wounding Event: Gradually Appeared Date Acquired: 11/24/2020 Weeks Of Treatment: 28 Clustered Wound: No Photos Wound Measurements Length: (cm) 0.6 Width: (cm) 0.4 Depth: (cm) 0.2 Area: (cm) 0.188 Volume: (cm) 0.038 % Reduction in Area: 77.8% % Reduction in Volume: 77.6% Epithelialization: Large (67-100%) Wound Description Classification: Full  Thickness Without Exposed Support Structu Wound Margin: Flat and Intact Exudate Amount: Medium Exudate Type: Serosanguineous Exudate Color: red, brown res Foul Odor After Cleansing: No Slough/Fibrino Yes Wound Bed Granulation Amount: Small (1-33%) Exposed Structure Granulation Quality: Red Fascia Exposed: No Necrotic Amount: Large (67-100%) Fat Layer (Subcutaneous Tissue) Exposed: Yes Necrotic Quality: Adherent Slough Tendon Exposed: No Muscle Exposed: No Joint Exposed: No Bone Exposed: No Treatment Notes Wound #1 (Lower Leg) Wound Laterality: Right, Medial Cleanser Soap and Water Discharge Instruction: Gently cleanse wound with  antibacterial soap, rinse and pat dry prior to dressing wounds Peri-Wound Care [place], Cheo L. ([id number]) Topical Primary Dressing Prisma 4.34 (in) Discharge Instruction: Moisten w/normal saline or sterile water; Cover wound as directed. Do not remove from wound bed. Secondary Dressing Telfa Adhesive [place] Dressing, 4x4 (in/in) Discharge Instruction: Apply over dressing to secure in place. Secured With Compression Wrap Compression Stockings Environmental education officer) Signed: 07/27/2021 4:38:54 PM By: Gretta Cool, BSN, RN, CWS, Kim RN, BSN Entered By: Gretta Cool, BSN, RN, CWS, Kim on 07/27/2021 09:26:56 [name] ([id number]) -------------------------------------------------------------------------------- Vitals Details Patient Name: [name], DUCHEMIN L. Date of Service: 07/27/2021 9:00 AM Medical Record Number: [id number] Patient Account Number: [id number] Date of Birth/Sex: 1971/08/19 (50 y.o. M) Treating RN: Cornell Barman Primary Care Johnnette Laux: PATIENT, NO Other Clinician: Levora Dredge Referring Yocheved Depner: Barkley Boards Treating Denessa Cavan/Extender: Skipper Cliche in Treatment: 28 Vital Signs Time Taken: 09:17 Temperature (F): 97.6 Height (in): 78 Pulse (bpm): 65 Weight (lbs): 249 Respiratory Rate (breaths/min): 18 Body Mass Index (BMI): 28.8 Blood Pressure (mmHg): 126/80 Reference Range: 80 - 120 mg / dl Electronic Signature(s) Signed: 07/27/2021 4:38:54 PM By: Gretta Cool, BSN, RN, CWS, Kim RN, BSN Entered By: Gretta Cool, BSN, RN, CWS, Kim on 07/27/2021 09:19:13

## 2021-08-10 ENCOUNTER — Other Ambulatory Visit: Payer: Self-pay

## 2021-08-10 ENCOUNTER — Encounter: Payer: Self-pay | Admitting: Physician Assistant

## 2021-08-10 NOTE — Progress Notes (Addendum)
KAMDON, REISIG ([id number]) Visit Report for 08/10/2021 Chief Complaint Document Details Patient Name: [name], [name]. Date of Service: 08/10/2021 9:00 AM Medical Record Number: [id number] Patient Account Number: [id number] Date of Birth/Sex: [birth year] (50 y.o. M) Treating RN: Levora Dredge Primary Care Provider: PATIENT, NO Other Clinician: Referring Provider: Barkley Boards Treating Provider/Extender: Skipper Cliche in Treatment: 30 Information Obtained from: Patient Chief Complaint Right LE Ulcer Electronic Signature(s) Signed: 08/10/2021 9:15:51 AM By: Worthy Keeler PA-C Entered By: Worthy Keeler on 08/10/2021 09:15:50 Malverne, Wilder ([id number]) -------------------------------------------------------------------------------- HPI Details Patient Name: [name] Date of Service: 08/10/2021 9:00 AM Medical Record Number: [id number] Patient Account Number: [id number] Date of Birth/Sex: 24-Jul-1971 (50 y.o. M) Treating RN: Levora Dredge Primary Care Provider: PATIENT, NO Other Clinician: Referring Provider: Barkley Boards Treating Provider/Extender: Skipper Cliche in Treatment: 30 History of Present Illness HPI Description: 01/12/2021 upon evaluation today patient appears to be doing somewhat poorly in regard to a wound on his right medial lower leg. Currently he tells me that this has been going on since around the beginning of April. He has done a telehealth visit with a physician who initially gave him a triamcinolone cream along with Keflex for 5 days. Subsequently he ended up being seen at walk-in urgent care where they also given Keflex for 7 days that seem to be doing better for him. Overall he tells me that things have improved from the standpoint of redness. Fortunately there does not appear to be any signs of systemic infection to be honest right now even locally I do not see any evidence of infection right now. He does have 1 main wound with some scattering  areas that look like they may have been small ulcerations but again for the most part these have filled in. This does look almost to be more of a vasculitis type scenario based on what I am seeing. Patient does have chronic venous insufficiency but is not wearing any compression even though it sounds like he has been told before he should. 5/27; patient with small wounds on his right medial ankle. He has chronic venous insufficiency and stasis dermatitis a large open wound and several smaller areas all of which looks better than on presentation last week according to her nursing staff 01/26/2021 upon evaluation today patient appears to be doing well with regard to his wound. He is making good progress. With that being said there is some need currently for sharp debridement in regard to the wound there is some necrotic debris it is almost completely cleaned off but not completely. He is in agreement with that plan today. Fortunately there does not appear to be any evidence of active infection which is great and I am very pleased in that regard. He still wishes he did not have to wear the compression wrap. He did get his compression socks from elastic therapy though they are in the 15 to 20 mmHg range they did not have any his length as he is a tall guy in the 20 to 30 mmHg range. 02/02/2021 upon evaluation today patient appears to be doing excellent in regard to his leg ulcer. He has been tolerating the dressing changes without complication. Fortunately there is no evidence of active infection at this time. No fevers, chills, nausea, vomiting, or diarrhea. 02/09/2021 upon evaluation today patient's wound actually showing signs of improvement. Fortunately there does not appear to be any evidence of active infection which is great news overall very pleased with where  things stand today. 02/16/2021 upon evaluation today patient's wound is showing some signs of improvement. He did have a fairly aggressive  debridement last week he notes that he had quite a bit of discomfort he was some swelling following. With that being said I think this was to be expected considering what we had to do from a debridement standpoint. The good news is there does not appear to be any evidence of infection currently nonetheless I do believe that the patient may have some signs of vasculitis here. Based on the overall appearance of the wound today and beginning to suspect this. For that reason I think he would benefit from starting him on triamcinolone ointment to the wound bed and some of the immediate periwound to try to help out in this regard. 02/23/2021 upon evaluation today patient appears to be doing well with regard to his wound. He is actually making some progress here. The wound is roughly about the same size but the overall appearance of the wound bed is much improved. I do not see any signs of active infection at this time which is great news. No fevers, chills, nausea, vomiting, or diarrhea. 03/02/2021 upon evaluation today patient appears to be doing well with regard to his wound. I think the biggest issue I see right now is that with Hydrofera Blue this wound is staying somewhat dry. I think he could benefit from the use of collagen to try to help with getting this to stimulate some additional growth. He is in agreement with the plan. I think this also had a little bit more moisture which would be helpful at this point. 03/09/2021 on evaluation today patient appears to be doing about the same in regard to his wound is measuring a little bit smaller but still he does have the surface of the wound which is not quite as healthy as I would like to see. Fortunately there does not appear to be any active infection at this time. No fevers, chills, nausea, vomiting, or diarrhea. 03/16/2021 upon evaluation today patient appears to be doing better with regard to the overall appearance of his wound bed. There does not appear  to be any signs of significant slough buildup I think the Iodoflex has done very well for him. The patient's happy to hear this and see the improvement. With that being said he is continuing to have a little bit of expansion of the wound again I think this is more related to #1 edema control and #2 the fact that again we will get the wound bed clean. I feel like were pretty much today as far as cleaning up the wound bed is concerned now we just need to make sure the edema is optimized. I am just not certain that his compression socks are doing the job. 03/23/2021 upon evaluation today patient unfortunately appears to be doing a little bit worse with regard to his wound I was actually expecting this to be much better this week. With that being said I am getting concerned about the possibility of pyoderma gangrenosum. I did print off some information for the patient in this regard. Subsequently I am also can see about starting him on some steroid cream as well as an oral steroid. 03/30/2021 upon evaluation today patient appears to be doing significantly better in regard to his wound. He has started using the clobetasol which seems to have made a excellent improvement in regard to the wound today. 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 Either way at this point I would recommend that we avoid sharp debridements I think that is probably can be the best option based on what I am seeing currently. The patient voiced understanding. He also had questions about taking the prednisone with regard to his immune system. I explained that I do believe that that can be an issue long-term but for short-term which is mainly what we are doing here I do not think it is going to be a major issue for him and in fact I think would help more especially  considering the pyoderma here and the fact that is more of an autoimmune type issue than not using the prednisone. 04/06/2021 upon evaluation today patient's wound actually showed signs of excellent improvement. This definitely has gone a lot better since we started with the topical steroids and overall I am extremely pleased with where things stand. No fevers, chills, nausea, vomiting, or diarrhea. With that being said the patient continues to feel much better and states that the pain is actually dramatically improved as well. This definitely appears Prady[place], [place]man [place] ([id number]) to be a pyoderma situation. 04/13/2021 upon evaluation today patient appears to be doing well with regard to his wound. Fortunately there does not appear to be any signs of active infection at this time. No fevers, chills, nausea, vomiting, or diarrhea. With that being said I do believe that he is overall doing extremely well. This is measuring smaller and does seem to be headed in the right direction. 04/20/2021 upon evaluation today patient appears to be doing well with regard to his wound. He still has quite a bit of slough covering the surface of the wound. With that being said we previously debrided the wound he actually had a bit of worsening and therefore I am somewhat reluctant to proceed with any further debridement at this point. In fact this got quite a bit larger postdebridement. 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 Nonetheless I think it is can to take time to get this to heal. 04/27/2021 upon evaluation today patient appears to be doing well with regard to his wound. This is measuring smaller this week yet again and overall I am extremely pleased with where we stand currently. Fortunately there does not appear to be any evidence of active infection which is great news. No fevers, chills, nausea, vomiting, or diarrhea. 05/04/2021 upon evaluation today patient's wound actually has a significant amount  of biofilm noted on the surface of the wound. Fortunately there does not appear to be any evidence of active infection at this time which is great news and overall I am extremely pleased with where we stand today. However I do think we may need to try to see about a sharp debridement to clear away some of the slough and biofilm on the surface of the wound to try to help this to heal more effectively and quickly. Patient voiced understanding. 05/11/2021 upon evaluation today patient appears to be doing better in regard to his wound. He has been tolerating the dressing changes without complication. Fortunately there does not appear to be any signs of active infection at this time which is great news. No fever chills noted 05/25/2021 upon evaluation today patient's wound is actually showing signs of excellent improvement. Fortunately there does not appear to  be any evidence of infection at this time which is great news and overall I am extremely pleased with where we stand currently. I do feel like that the patient is improving although is very slow where little by little seen in this improvement. 06/01/2021 upon evaluation today patient appears to be doing well with regard to his wound. She has been tolerating the dressing changes without complication. With that being said it somewhat slow to heal at this point. I do think we could try to switch things up a little bit and see about using collagen in place that what we have been doing. The patient is in agreement with that plan. 06/08/2021 upon evaluation today patient appears to be doing well with regard to his wound I do feel like the collagen is helping with a little additional granulation. Fortunately there does not appear to be any signs of infection which is great news. No fevers, chills, nausea, vomiting, or diarrhea. 06/15/2021 upon evaluation today patient appears to be doing well with regard to his wound. In fact this is showing signs of  improvement is going slowly but nonetheless making good progress in my opinion. I do not see any signs of active infection at this time. No fevers, chills, nausea, vomiting, or diarrhea. 06/29/2021 upon evaluation today patient appears to be doing well with regard to his leg ulcer. He has been tolerating the dressing changes without complication. Fortunately there does not appear to be any evidence of active infection which is great and overall I am extremely pleased with where we stand today. He still is using the clobetasol just a small amount over top of the collagen. 07/13/2021 patient's wound bed actually showed signs currently of doing okay there does not appear to be any signs of infection which is great news. No fevers, chills, nausea, vomiting, or diarrhea. 07/27/2021 upon evaluation today patient's wound is actually showing signs of improvement which is great news. This is measuring smaller significantly and looks much better. I think stopping the clobetasol at this point was a good call. Obviously he is making good progress. The collagen does seem to be doing an awesome job. 08/10/2021 upon evaluation today patient appears to be doing awesome in regard to his wound. He is definitely making some good progress here. I am very pleased with where we stand. Electronic Signature(s) Signed: 08/10/2021 5:34:55 PM By: Worthy Keeler PA-C Entered By: Worthy Keeler on 08/10/2021 17:34:55 Stegman, Kelly Carlean Jews ([id number]) -------------------------------------------------------------------------------- Physical Exam Details Patient Name: KELLIS, MCADAM L. Date of Service: 08/10/2021 9:00 AM Medical Record Number: [id number] Patient Account Number: [id number] Date of Birth/Sex: Jan 13, 1971 (50 y.o. M) Treating RN: Levora Dredge Primary Care Provider: PATIENT, NO Other Clinician: Referring Provider: Barkley Boards Treating Provider/Extender: Skipper Cliche in Treatment:  21 Constitutional Well-nourished and well-hydrated in no acute distress. Respiratory normal breathing without difficulty. Psychiatric this patient is able to make decisions and demonstrates good insight into disease process. Alert and Oriented x 3. pleasant and cooperative. Notes Patient's wound bed again showed excellent granulation and epithelization at this point and overall I think that he is making all some progress here. I do not see any signs of active infection locally nor systemically at this point. Electronic Signature(s) Signed: 08/10/2021 5:35:14 PM By: Worthy Keeler PA-C Entered By: Worthy Keeler on 08/10/2021 17:35:14 [name] ([id number]) -------------------------------------------------------------------------------- Physician Orders Details Patient Name: DUTCH, ING L. Date of Service: 08/10/2021 9:00 AM Medical Record Number: [id number] Patient Account Number: [id number] Date  of Birth/Sex: September 09, 1970 (50 y.o. M) Treating RN: Levora Dredge Primary Care Provider: PATIENT, NO Other Clinician: Referring Provider: Barkley Boards Treating Provider/Extender: Skipper Cliche in Treatment: 30 Verbal / Phone Orders: No Diagnosis Coding ICD-10 Coding Code Description I87.2 Venous insufficiency (chronic) (peripheral) L97.812 Non-pressure chronic ulcer of other part of right lower leg with fat layer exposed Follow-up Appointments o Return Appointment in 3 weeks. Bathing/ Shower/ Hygiene o May shower; gently cleanse wound with antibacterial soap, rinse and pat dry prior to dressing wounds o No tub bath. o Other: - avoid hot tubs, pools and oceans Edema Control - Lymphedema / Segmental Compressive Device / Other Bilateral Lower Extremities o Patient to wear own compression stockings. Remove compression stockings every night before going to bed and put on every morning when getting up. o Elevate, Exercise Daily and Avoid Standing for Long Periods of  Time. o Elevate legs to the level of the heart and pump ankles as often as possible o Elevate leg(s) parallel to the floor when sitting. o DO YOUR BEST to sleep in the bed at night. DO NOT sleep in your recliner. Long hours of sitting in a recliner leads to swelling of the legs and/or potential wounds on your backside. Additional Orders / Instructions o Follow Nutritious Diet and Increase Protein Intake Wound Treatment Wound #1 - Lower Leg Wound Laterality: Right, Medial Cleanser: Soap and Water Every Other Day/30 Days Discharge Instructions: Gently cleanse wound with antibacterial soap, rinse and pat dry prior to dressing wounds Primary Dressing: Prisma 4.34 (in) Every Other Day/30 Days Discharge Instructions: Moisten w/normal saline or sterile water; Cover wound as directed. Do not remove from wound bed. Secondary Dressing: [place] Dressing, 4x4 (in/in) Every Other Day/30 Days Discharge Instructions: Apply over dressing to secure in place. Electronic Signature(s) Signed: 08/10/2021 5:41:04 PM By: Worthy Keeler PA-C Signed: 08/15/2021 12:56:33 PM By: Levora Dredge Entered By: Levora Dredge on 08/10/2021 10:03:48 [name] ([id number]) -------------------------------------------------------------------------------- Problem List Details Patient Name: BECK, COFER L. Date of Service: 08/10/2021 9:00 AM Medical Record Number: [id number] Patient Account Number: [id number] Date of Birth/Sex: [birth year] (50 y.o. M) Treating RN: Levora Dredge Primary Care Provider: PATIENT, NO Other Clinician: Referring Provider: Barkley Boards Treating Provider/Extender: Skipper Cliche in Treatment: 30 Active Problems ICD-10 Encounter Code Description Active Date MDM Diagnosis I87.2 Venous insufficiency (chronic) (peripheral) 01/12/2021 No Yes L97.812 Non-pressure chronic ulcer of other part of right lower leg with fat layer 01/12/2021 No Yes exposed Inactive  Problems Resolved Problems Electronic Signature(s) Signed: 08/10/2021 9:15:46 AM By: Worthy Keeler PA-C Entered By: Worthy Keeler on 08/10/2021 09:15:46 Sieloff, Harlin L. ([id number]) -------------------------------------------------------------------------------- Progress Note Details Patient Name: [name] Date of Service: 08/10/2021 9:00 AM Medical Record Number: [id number] Patient Account Number: [id number] Date of Birth/Sex: 28-Dec-1970 (50 y.o. M) Treating RN: Levora Dredge Primary Care Provider: PATIENT, NO Other Clinician: Referring Provider: Barkley Boards Treating Provider/Extender: Skipper Cliche in Treatment: 30 Subjective Chief Complaint Information obtained from Patient Right LE Ulcer History of Present Illness (HPI) 01/12/2021 upon evaluation today patient appears to be doing somewhat poorly in regard to a wound on his right medial lower leg. Currently he tells me that this has been going on since around the beginning of April. He has done a telehealth visit with a physician who initially gave him a triamcinolone cream along with Keflex for 5 days. Subsequently he ended up being seen at walk-in urgent care where they also given Keflex for 7 days that  seem to be doing better for him. Overall he tells me that things have improved from the standpoint of redness. Fortunately there does not appear to be any signs of systemic infection to be honest right now even locally I do not see any evidence of infection right now. He does have 1 main wound with some scattering areas that look like they may have been small ulcerations but again for the most part these have filled in. This does look almost to be more of a vasculitis type scenario based on what I am seeing. Patient does have chronic venous insufficiency but is not wearing any compression even though it sounds like he has been told before he should. 5/27; patient with small wounds on his right medial ankle. He has  chronic venous insufficiency and stasis dermatitis a large open wound and several smaller areas all of which looks better than on presentation last week according to her nursing staff 01/26/2021 upon evaluation today patient appears to be doing well with regard to his wound. He is making good progress. With that being said there is some need currently for sharp debridement in regard to the wound there is some necrotic debris it is almost completely cleaned off but not completely. He is in agreement with that plan today. Fortunately there does not appear to be any evidence of active infection which is great and I am very pleased in that regard. He still wishes he did not have to wear the compression wrap. He did get his compression socks from elastic therapy though they are in the 15 to 20 mmHg range they did not have any his length as he is a tall guy in the 20 to 30 mmHg range. 02/02/2021 upon evaluation today patient appears to be doing excellent in regard to his leg ulcer. He has been tolerating the dressing changes without complication. Fortunately there is no evidence of active infection at this time. No fevers, chills, nausea, vomiting, or diarrhea. 02/09/2021 upon evaluation today patient's wound actually showing signs of improvement. Fortunately there does not appear to be any evidence of active infection which is great news overall very pleased with where things stand today. 02/16/2021 upon evaluation today patient's wound is showing some signs of improvement. He did have a fairly aggressive debridement last week he notes that he had quite a bit of discomfort he was some swelling following. With that being said I think this was to be expected considering what we had to do from a debridement standpoint. The good news is there does not appear to be any evidence of infection currently nonetheless I do believe that the patient may have some signs of vasculitis here. Based on the overall appearance of  the wound today and beginning to suspect this. For that reason I think he would benefit from starting him on triamcinolone ointment to the wound bed and some of the immediate periwound to try to help out in this regard. 02/23/2021 upon evaluation today patient appears to be doing well with regard to his wound. He is actually making some progress here. The wound is roughly about the same size but the overall appearance of the wound bed is much improved. I do not see any signs of active infection at this time which is great news. No fevers, chills, nausea, vomiting, or diarrhea. 03/02/2021 upon evaluation today patient appears to be doing well with regard to his wound. I think the biggest issue I see right now is that with [facility] this  wound is staying somewhat dry. I think he could benefit from the use of collagen to try to help with getting this to stimulate some additional growth. He is in agreement with the plan. I think this also had a little bit more moisture which would be helpful at this point. 03/09/2021 on evaluation today patient appears to be doing about the same in regard to his wound is measuring a little bit smaller but still he does have the surface of the wound which is not quite as healthy as I would like to see. Fortunately there does not appear to be any active infection at this time. No fevers, chills, nausea, vomiting, or diarrhea. 03/16/2021 upon evaluation today patient appears to be doing better with regard to the overall appearance of his wound bed. There does not appear to be any signs of significant slough buildup I think the Iodoflex has done very well for him. The patient's happy to hear this and see the improvement. With that being said he is continuing to have a little bit of expansion of the wound again I think this is more related to #1 edema control and #2 the fact that again we will get the wound bed clean. I feel like were pretty much today as far as cleaning up the  wound bed is concerned now we just need to make sure the edema is optimized. I am just not certain that his compression socks are doing the job. 03/23/2021 upon evaluation today patient unfortunately appears to be doing a little bit worse with regard to his wound I was actually expecting this to be much better this week. With that being said I am getting concerned about the possibility of pyoderma gangrenosum. I did print off some information for the patient in this regard. Subsequently I am also can see about starting him on some steroid cream as well as an oral steroid. 03/30/2021 upon evaluation today patient appears to be doing significantly better in regard to his wound. He has started using the clobetasol which seems to have made a excellent improvement in regard to the wound today. 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 Either way at this point I would recommend that we avoid sharp debridements I think that is probably can be the best option based on what I am seeing currently. The patient voiced understanding. He also had questions about taking the prednisone with regard to his immune system. I explained that I do believe that that can be an issue long-term but for short-term which is mainly what we are doing here I do not think it is going to be a major issue for him and in fact I think would help more especially considering the pyoderma here and the fact that is more of an Sammy Martinez, [place] ([id number]) autoimmune type issue than not using the prednisone. 04/06/2021 upon evaluation today patient's wound actually showed signs of excellent improvement. This definitely has gone a lot better since we started with the topical steroids and overall I am extremely pleased with where things stand. No fevers, chills, nausea,  vomiting, or diarrhea. With that being said the patient continues to feel much better and states that the pain is actually dramatically improved as well. 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 04/13/2021 upon evaluation today patient  appears to be doing well with regard to his wound. Fortunately there does not appear to be any signs of active infection at this time. No fevers, chills, nausea, vomiting, or diarrhea. With that being said I do believe that he is overall doing extremely well. This is measuring smaller and does seem to be headed in the right direction. 04/20/2021 upon evaluation today patient appears to be doing well with regard to his wound. He still has quite a bit of slough covering the surface of the wound. With that being said we previously debrided the wound he actually had a bit of worsening and therefore I am somewhat reluctant to proceed with any further debridement at this point. In fact this got quite a bit larger postdebridement. 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 Nonetheless I think it is can to take time to get this to heal. 04/27/2021 upon evaluation today patient appears to be doing well with regard to his wound. This is measuring smaller this week yet again and overall I am extremely pleased with where we stand currently. Fortunately there does not appear to be any evidence of active infection which is great news. No fevers, chills, nausea, vomiting, or diarrhea. 05/04/2021 upon evaluation today patient's wound actually has a significant amount of biofilm noted on the surface of the wound. Fortunately there does not appear to be any evidence of active infection at this time which is great news and overall I am extremely pleased with where we stand today. However I do think we may need to try to see about a sharp debridement to clear away some of the slough and biofilm on the surface of the wound to try to help this to heal more effectively and  quickly. Patient voiced understanding. 05/11/2021 upon evaluation today patient appears to be doing better in regard to his wound. He has been tolerating the dressing changes without complication. Fortunately there does not appear to be any signs of active infection at this time which is great news. No fever chills noted 05/25/2021 upon evaluation today patient's wound is actually showing signs of excellent improvement. Fortunately there does not appear to be any evidence of infection at this time which is great news and overall I am extremely pleased with where we stand currently. I do feel like that the patient is improving although is very slow where little by little seen in this improvement. 06/01/2021 upon evaluation today patient appears to be doing well with regard to his wound. She has been tolerating the dressing changes without complication. With that being said it somewhat slow to heal at this point. I do think we could try to switch things up a little bit and see about using collagen in place that what we have been doing. The patient is in agreement with that plan. 06/08/2021 upon evaluation today patient appears to be doing well with regard to his wound I do feel like the collagen is helping with a little additional granulation. Fortunately there does not appear to be any signs of infection which is great news. No fevers, chills, nausea, vomiting, or diarrhea. 06/15/2021 upon evaluation today patient appears to be doing well with regard to his wound. In fact this is showing signs of improvement is going slowly but nonetheless making good progress in my opinion. I do not see any signs of active infection at this time. No fevers, chills, nausea, vomiting, or diarrhea. 06/29/2021 upon evaluation today patient appears to be doing well with regard to  his leg ulcer. He has been tolerating the dressing changes without complication. Fortunately there does not appear to be any evidence of active  infection which is great and overall I am extremely pleased with where we stand today. He still is using the clobetasol just a small amount over top of the collagen. 07/13/2021 patient's wound bed actually showed signs currently of doing okay there does not appear to be any signs of infection which is great news. No fevers, chills, nausea, vomiting, or diarrhea. 07/27/2021 upon evaluation today patient's wound is actually showing signs of improvement which is great news. This is measuring smaller significantly and looks much better. I think stopping the clobetasol at this point was a good call. Obviously he is making good progress. The collagen does seem to be doing an awesome job. 08/10/2021 upon evaluation today patient appears to be doing awesome in regard to his wound. He is definitely making some good progress here. I am very pleased with where we stand. Objective Constitutional Well-nourished and well-hydrated in no acute distress. Vitals Time Taken: 9:23 AM, Height: 78 in, Weight: 249 lbs, BMI: 28.8, Temperature: 97.7 F, Pulse: 60 bpm, Respiratory Rate: 18 breaths/min, Blood Pressure: 142/90 mmHg. Respiratory normal breathing without difficulty. Psychiatric HIMMAT, ENBER[place] ([id number]) this patient is able to make decisions and demonstrates good insight into disease process. Alert and Oriented x 3. pleasant and cooperative. General Notes: Patient's wound bed again showed excellent granulation and epithelization at this point and overall I think that he is making all some progress here. I do not see any signs of active infection locally nor systemically at this point. Integumentary (Hair, Skin) Wound #1 status is Open. Original cause of wound was Gradually Appeared. The date acquired was: 11/24/2020. The wound has been in treatment 30 weeks. The wound is located on the Right,Medial Lower Leg. The wound measures 0.2cm length x 0.4cm width x 0.2cm depth; 0.063cm^2 area and 0.013cm^3  volume. There is Fat Layer (Subcutaneous Tissue) exposed. There is no tunneling or undermining noted. There is a medium amount of serosanguineous drainage noted. The wound margin is flat and intact. There is large (67-100%) red granulation within the wound bed. There is a small (1-33%) amount of necrotic tissue within the wound bed. Assessment Active Problems ICD-10 Venous insufficiency (chronic) (peripheral) Non-pressure chronic ulcer of other part of right lower leg with fat layer exposed Plan Follow-up Appointments: Return Appointment in 3 weeks. Bathing/ Shower/ Hygiene: May shower; gently cleanse wound with antibacterial soap, rinse and pat dry prior to dressing wounds No tub bath. Other: - avoid hot tubs, pools and oceans Edema Control - Lymphedema / Segmental Compressive Device / Other: Patient to wear own compression stockings. Remove compression stockings every night before going to bed and put on every morning when getting up. Elevate, Exercise Daily and Avoid Standing for Long Periods of Time. Elevate legs to the level of the heart and pump ankles as often as possible Elevate leg(s) parallel to the floor when sitting. DO YOUR BEST to sleep in the bed at night. DO NOT sleep in your recliner. Long hours of sitting in a recliner leads to swelling of the legs and/or potential wounds on your backside. Additional Orders / Instructions: Follow Nutritious Diet and Increase Protein Intake WOUND #1: - Lower Leg Wound Laterality: Right, Medial Cleanser: Soap and Water Every Other Day/30 Days Discharge Instructions: Gently cleanse wound with antibacterial soap, rinse and pat dry prior to dressing wounds Primary Dressing: Prisma 4.34 (in) Every Other Day/30  Days Discharge Instructions: Moisten w/normal saline or sterile water; Cover wound as directed. Do not remove from wound bed. Secondary Dressing: [place] Dressing, 4x4 (in/in) Every Other Day/30 Days Discharge  Instructions: Apply over dressing to secure in place. 1. Recommend that we continue with the silver collagen the patient still in agreement with that plan. Subsequently we will plan to see him back he has been doing 2-week follow-ups that was my plan for today as well. However he wants to wait till I am back in town in 2 weeks I would not be [place] to see him in 3 weeks for that reason. 2. I am also can recommend that he continue with the border gauze dressing to cover and then subsequently his compression sock he does great with this. We will see patient back for reevaluation in 3 weeks here in the clinic. If anything worsens or changes patient will contact our office for additional recommendations. Electronic Signature(s) Signed: 08/10/2021 5:36:07 PM By: Worthy Keeler PA-C Previous Signature: 08/10/2021 5:35:52 PM Version By: Worthy Keeler PA-C Entered By: Worthy Keeler on 08/10/2021 17:36:06 Piscopo, Kit L. ([id number]) -------------------------------------------------------------------------------- SuperBill Details Patient Name: [name] Date of Service: 08/10/2021 Medical Record Number: [id number] Patient Account Number: [id number] Date of Birth/Sex: 07-Feb-1971 (50 y.o. M) Treating RN: Levora Dredge Primary Care Provider: PATIENT, NO Other Clinician: Referring Provider: Barkley Boards Treating Provider/Extender: Skipper Cliche in Treatment: 30 Diagnosis Coding ICD-10 Codes Code Description I87.2 Venous insufficiency (chronic) (peripheral) L97.812 Non-pressure chronic ulcer of other part of right lower leg with fat layer exposed Facility Procedures CPT4 Code: [id number] Description: [phone] - WOUND CARE VISIT-LEV 2 EST PT Modifier: Quantity: 1 Physician Procedures CPT4 Code: 2197588 Description: 32549 - WC PHYS LEVEL 3 - EST PT Modifier: Quantity: 1 CPT4 Code: Description: ICD-10 Diagnosis Description I87.2 Venous insufficiency (chronic) (peripheral) L97.812  Non-pressure chronic ulcer of other part of right lower leg with fat la Modifier: yer exposed Quantity: Electronic Signature(s) Signed: 08/10/2021 5:36:51 PM By: Worthy Keeler PA-C Entered By: Worthy Keeler on 08/10/2021 17:36:51

## 2021-08-10 NOTE — Progress Notes (Addendum)
[name], [name] ([id number]) Visit Report for 08/10/2021 Arrival Information Details Patient Name: [name], [name]. Date of Service: 08/10/2021 9:00 AM Medical Record Number: [id number] Patient Account Number: [id number] Date of Birth/Sex: 1970-10-12 (50 y.o. M) Treating RN: [name] Primary Care [name]: PATIENT, NO Other Clinician: Referring [name]: [name] Treating [name]/Extender: [name] in Treatment: 57 Visit Information History Since Last Visit Added or deleted any medications: No Patient Arrived: Ambulatory Any new allergies or adverse reactions: No Arrival Time: 09:22 Had a fall or experienced change in No Accompanied By: self activities of daily living that may affect Transfer Assistance: None risk of falls: Patient Identification Verified: Yes Hospitalized since last visit: No Secondary Verification Process Completed: Yes Has Dressing in Place as Prescribed: Yes Patient Requires Transmission-Based Precautions: No Pain Present Now: No Patient Has Alerts: Yes Patient Alerts: NOT DIABETIC Electronic Signature(s) Signed: 08/15/2021 12:56:33 PM By: [name] Entered By: [name] on 08/10/2021 09:23:32 [name], [name]. ([id number]) -------------------------------------------------------------------------------- Clinic Level of Care Assessment Details Patient Name: Nicolas Cousin L. Date of Service: 08/10/2021 9:00 AM Medical Record Number: [id number] Patient Account Number: [id number] Date of Birth/Sex: 03-08-1971 (50 y.o. M) Treating RN: [name] Primary Care Dairon Procter: PATIENT, NO Other Clinician: Referring [name]: [name] Treating [name]/Extender: [name] in Treatment: 30 Clinic Level of Care Assessment Items TOOL 4 Quantity Score X - Use when only an EandM is performed on FOLLOW-UP visit 1 0 ASSESSMENTS - Nursing Assessment / Reassessment []  - Reassessment of Co-morbidities (includes updates in patient  status) 0 []  - 0 Reassessment of Adherence to Treatment Plan ASSESSMENTS - Wound and Skin Assessment / Reassessment X - Simple Wound Assessment / Reassessment - one wound 1 5 []  - 0 Complex Wound Assessment / Reassessment - multiple wounds []  - 0 Dermatologic / Skin Assessment (not related to wound area) ASSESSMENTS - Focused Assessment []  - Circumferential Edema Measurements - multi extremities 0 []  - 0 Nutritional Assessment / Counseling / Intervention []  - 0 Lower Extremity Assessment (monofilament, tuning fork, pulses) []  - 0 Peripheral Arterial Disease Assessment (using hand held doppler) ASSESSMENTS - Ostomy and/or Continence Assessment and Care []  - Incontinence Assessment and Management 0 []  - 0 Ostomy Care Assessment and Management (repouching, etc.) PROCESS - Coordination of Care X - Simple Patient / Family Education for ongoing care 1 15 []  - 0 Complex (extensive) Patient / Family Education for ongoing care []  - 0 Staff obtains Programmer, systems, Records, Test Results / Process Orders []  - 0 Staff telephones HHA, Nursing Homes / Clarify orders / etc []  - 0 Routine Transfer to another Facility (non-emergent condition) []  - 0 Routine Hospital Admission (non-emergent condition) []  - 0 New Admissions / Biomedical engineer / Ordering NPWT, Apligraf, etc. []  - 0 Emergency Hospital Admission (emergent condition) X- 1 10 Simple Discharge Coordination []  - 0 Complex (extensive) Discharge Coordination PROCESS - Special Needs []  - Pediatric / Minor Patient Management 0 []  - 0 Isolation Patient Management []  - 0 Hearing / Language / Visual special needs []  - 0 Assessment of Community assistance (transportation, D/C planning, etc.) []  - 0 Additional assistance / Altered mentation []  - 0 Support Surface(s) Assessment (bed, cushion, seat, etc.) INTERVENTIONS - Wound Cleansing / Measurement Patchin, [name] ([id number]) X- 1 5 Simple Wound Cleansing - one wound []  -  0 Complex Wound Cleansing - multiple wounds X- 1 5 Wound Imaging (photographs - any number of wounds) []  - 0 Wound Tracing (instead of photographs) X- 1 5 Simple  Wound Measurement - one wound []  - 0 Complex Wound Measurement - multiple wounds INTERVENTIONS - Wound Dressings X - Small Wound Dressing one or multiple wounds 1 10 []  - 0 Medium Wound Dressing one or multiple wounds []  - 0 Large Wound Dressing one or multiple wounds []  - 0 Application of Medications - topical []  - 0 Application of Medications - injection INTERVENTIONS - Miscellaneous []  - External ear exam 0 []  - 0 Specimen Collection (cultures, biopsies, blood, body fluids, etc.) []  - 0 Specimen(s) / Culture(s) sent or taken to Lab for analysis []  - 0 Patient Transfer (multiple staff / Civil Service fast streamer / Similar devices) []  - 0 Simple Staple / Suture removal (25 or less) []  - 0 Complex Staple / Suture removal (26 or more) []  - 0 Hypo / Hyperglycemic Management (close monitor of Blood Glucose) []  - 0 Ankle / Brachial Index (ABI) - do not check if billed separately X- 1 5 Vital Signs Has the patient been seen at the hospital within the last three years: Yes Total Score: 60 Level Of Care: New/Established - Level 2 Electronic Signature(s) Signed: 08/15/2021 12:56:33 PM By: [name] Entered By: [name] on 08/10/2021 10:02:50 [name], [name] ([id number]) -------------------------------------------------------------------------------- Complex / Palliative Patient Assessment Details Patient Name: Nicolas Cousin L. Date of Service: 08/10/2021 9:00 AM Medical Record Number: [id number] Patient Account Number: [id number] Date of Birth/Sex: [birth year] (50 y.o. M) Treating RN: [name] Primary Care Jaidynn Balster: PATIENT, NO Other Clinician: Referring [name]: [name] Treating Dalores Weger/Extender: [name] in Treatment: 30 Palliative Management Criteria Complex Wound Management Criteria Patient  has remarkable or complex co-morbidities requiring medications or treatments that extend wound healing times. Examples: o Diabetes mellitus with chronic renal failure or end stage renal disease requiring dialysis o Advanced or poorly controlled rheumatoid arthritis o Diabetes mellitus and end stage chronic obstructive pulmonary disease o Active cancer with current chemo- or radiation therapy chronic venous insufficiency Care Approach Wound Care Plan: Complex Wound Management Electronic Signature(s) Signed: 08/10/2021 9:50:52 AM By: [name] Signed: 08/10/2021 5:41:04 PM By: Worthy Keeler PA-C Entered By: [name] on 08/10/2021 [name], [name] ([id number]) -------------------------------------------------------------------------------- Encounter Discharge Information Details Patient Name: LOLA, LOFARO L. Date of Service: 08/10/2021 9:00 AM Medical Record Number: [id number] Patient Account Number: [id number] Date of Birth/Sex: 1970/12/24 (50 y.o. M) Treating RN: [name] Primary Care Pollyanna Levay: PATIENT, NO Other Clinician: Referring Constantino Starace: [name] Treating [place] Mckeough/Extender: [name] in Treatment: 30 Encounter Discharge Information Items Discharge Condition: Stable Ambulatory Status: Ambulatory Discharge Destination: Home Transportation: Private Auto Accompanied By: self Schedule Follow-up Appointment: Yes Clinical Summary of Care: Electronic Signature(s) Signed: 08/15/2021 12:56:33 PM By: [name] Entered By: [name] on 08/10/2021 10:04:14 [name] ([id number]) -------------------------------------------------------------------------------- Lower Extremity Assessment Details Patient Name: [name], [name] Date of Service: 08/10/2021 9:00 AM Medical Record Number: [id number] Patient Account Number: [id number] Date of Birth/Sex: 01-08-1971 (50 y.o. M) Treating RN: [name] Primary Care Makael Stein: PATIENT,  NO Other Clinician: Referring Dannika Hilgeman: [name] Treating Donis Pinder/Extender: [name] in Treatment: 30 Edema Assessment Assessed: [Left: No] [Right: No] Edema: [Left: N] [Right: o] Vascular Assessment Pulses: Dorsalis Pedis Palpable: [Right:Yes] Electronic Signature(s) Signed: 08/15/2021 12:56:33 PM By: [name] Entered By: [name] on 08/10/2021 09:32:54 [name], [name] ([id number]) -------------------------------------------------------------------------------- Multi Wound Chart Details Patient Name: Nicolas Cousin L. Date of Service: 08/10/2021 9:00 AM Medical Record Number: [id number] Patient Account Number: [id number] Date of Birth/Sex: 1971/03/30 (50 y.o. M) Treating RN: [name] Primary  Care Elesha Thedford: PATIENT, NO Other Clinician: Referring Kirubel Aja: [name] Treating Jenavee Laguardia/Extender: [name] in Treatment: 30 Vital Signs Height(in): 78 Pulse(bpm): 60 Weight(lbs): 249 Blood Pressure(mmHg): 142/90 Body Mass Index(BMI): 29 Temperature(F): 97.7 Respiratory Rate(breaths/min): 18 Photos: [N/A:N/A] Wound Location: Right, Medial Lower Leg N/A N/A Wounding Event: Gradually Appeared N/A N/A Primary Etiology: Venous Leg Ulcer N/A N/A Date Acquired: 11/24/2020 N/A N/A Weeks of Treatment: 30 N/A N/A Wound Status: Open N/A N/A Measurements L x W x D (cm) 0.2x0.4x0.2 N/A N/A Area (cm) : 0.063 N/A N/A Volume (cm) : 0.013 N/A N/A % Reduction in Area: 92.60% N/A N/A % Reduction in Volume: 92.40% N/A N/A Classification: Full Thickness Without Exposed N/A N/A Support Structures Exudate Amount: Medium N/A N/A Exudate Type: Serosanguineous N/A N/A Exudate Color: red, brown N/A N/A Wound Margin: Flat and Intact N/A N/A Granulation Amount: Large (67-100%) N/A N/A Granulation Quality: Red N/A N/A Necrotic Amount: Small (1-33%) N/A N/A Exposed Structures: Fat Layer (Subcutaneous Tissue): N/A N/A Yes Fascia: No Tendon: No Muscle:  No Joint: No Bone: No Epithelialization: Large (67-100%) N/A N/A Treatment Notes Electronic Signature(s) Signed: 08/15/2021 12:56:33 PM By: [name] Entered By: [name] on 08/10/2021 09:55:55 [name], [name] ([id number]) -------------------------------------------------------------------------------- [place] Details Patient Name: Nicolas Cousin L. Date of Service: 08/10/2021 9:00 AM Medical Record Number: [id number] Patient Account Number: [id number] Date of Birth/Sex: Feb 27, 1971 (50 y.o. M) Treating RN: [name] Primary Care Aneliese Beaudry: PATIENT, NO Other Clinician: Referring Dejanay Wamboldt: [name] Treating Alanna Storti/Extender: [name] in Treatment: 30 Active Inactive Necrotic Tissue Nursing Diagnoses: Impaired tissue integrity related to necrotic/devitalized tissue Knowledge deficit related to management of necrotic/devitalized tissue Goals: Necrotic/devitalized tissue will be minimized in the wound bed Date Initiated: 07/13/2021 Target Resolution Date: 07/13/2021 Goal Status: Active Patient/caregiver will verbalize understanding of reason and process for debridement of necrotic tissue Date Initiated: 07/13/2021 Target Resolution Date: 07/13/2021 Goal Status: Active Interventions: Assess patient pain level pre-, during and post procedure and prior to discharge Provide education on necrotic tissue and debridement process Treatment Activities: Apply topical anesthetic as ordered : 07/13/2021 Notes: Electronic Signature(s) Signed: 08/15/2021 12:56:33 PM By: [name] Entered By: [name] on 08/10/2021 09:55:46 [name], [name] ([id number]) -------------------------------------------------------------------------------- Pain Assessment Details Patient Name: Nicolas Cousin L. Date of Service: 08/10/2021 9:00 AM Medical Record Number: [id number] Patient Account Number: [id number] Date of Birth/Sex: [birth year] (50 y.o.  M) Treating RN: [name] Primary Care Arnetra Terris: PATIENT, NO Other Clinician: Referring Jeannette Maddy: [name] Treating Ramond Darnell/Extender: [name] in Treatment: 30 Active Problems Location of Pain Severity and Description of Pain Patient Has Paino No Site Locations Rate the pain. Current Pain Level: 0 Pain Management and Medication Current Pain Management: Electronic Signature(s) Signed: 08/15/2021 12:56:33 PM By: [name] Entered By: [name] on 08/10/2021 09:25:27 [name] ([id number]) -------------------------------------------------------------------------------- Patient/Caregiver Education Details Patient Name: Nicolas Cousin L. Date of Service: 08/10/2021 9:00 AM Medical Record Number: [id number] Patient Account Number: [id number] Date of Birth/Gender: July 15, 1971 (50 y.o. M) Treating RN: [name] Primary Care Physician: PATIENT, NO Other Clinician: Referring Physician: Barkley [name] Treating Physician/Extender: [name] in Treatment: 30 Education Assessment Education Provided To: Patient Education Topics Provided Wound/Skin Impairment: Handouts: Caring for Your Ulcer Responses: State content correctly Electronic Signature(s) Signed: 08/15/2021 12:56:33 PM By: [name] Entered By: [name] on 08/10/2021 10:03:12 [name] ([id number]) -------------------------------------------------------------------------------- Wound Assessment Details Patient Name: [name], [name] Date of Service: 08/10/2021 9:00 AM Medical Record Number: [id number] Patient Account Number: [id number] Date  of Birth/Sex: 10/10/1970 (49 y.o. M) Treating RN: [name] Primary Care Rachella Basden: PATIENT, NO Other Clinician: Referring Onie Hayashi: [name] Treating Chane Magner/Extender: [name] in Treatment: 30 Wound Status Wound Number: 1 Primary Etiology: Venous Leg Ulcer Wound Location: Right, Medial Lower Leg Wound  Status: Open Wounding Event: Gradually Appeared Date Acquired: 11/24/2020 Weeks Of Treatment: 30 Clustered Wound: No Photos Wound Measurements Length: (cm) 0.2 Width: (cm) 0.4 Depth: (cm) 0.2 Area: (cm) 0.063 Volume: (cm) 0.013 % Reduction in Area: 92.6% % Reduction in Volume: 92.4% Epithelialization: Large (67-100%) Tunneling: No Undermining: No Wound Description Classification: Full Thickness Without Exposed Support Structu Wound Margin: Flat and Intact Exudate Amount: Medium Exudate Type: Serosanguineous Exudate Color: red, brown res Foul Odor After Cleansing: No Slough/Fibrino Yes Wound Bed Granulation Amount: Large (67-100%) Exposed Structure Granulation Quality: Red Fascia Exposed: No Necrotic Amount: Small (1-33%) Fat Layer (Subcutaneous Tissue) Exposed: Yes Tendon Exposed: No Muscle Exposed: No Joint Exposed: No Bone Exposed: No Treatment Notes Wound #1 (Lower Leg) Wound Laterality: Right, Medial Cleanser Soap and Water Discharge Instruction: Gently cleanse wound with antibacterial soap, rinse and pat dry prior to dressing wounds Peri-Wound Care Mach, [name] ([id number]) Topical Primary Dressing Prisma 4.34 (in) Discharge Instruction: Moisten w/normal saline or sterile water; Cover wound as directed. Do not remove from wound bed. Secondary Dressing Telfa Adhesive [place] Dressing, 4x4 (in/in) Discharge Instruction: Apply over dressing to secure in place. Secured With Compression Wrap Compression Stockings Add-Ons Electronic Signature(s) Signed: 08/15/2021 12:56:33 PM By: [name] Entered By: [name] on 08/10/2021 09:55:28 [name], [name] ([id number]) -------------------------------------------------------------------------------- Vitals Details Patient Name: Nicolas Cousin L. Date of Service: 08/10/2021 9:00 AM Medical Record Number: [id number] Patient Account Number: [id number] Date of Birth/Sex: 1971/06/26 (50 y.o. M) Treating RN:  [name] Primary Care Nikie Cid: PATIENT, NO Other Clinician: Referring Annalyce Lanpher: [name] Treating Yarisbel Miranda/Extender: [name] in Treatment: 30 Vital Signs Time Taken: 09:23 Temperature (F): 97.7 Height (in): 78 Pulse (bpm): 60 Weight (lbs): 249 Respiratory Rate (breaths/min): 18 Body Mass Index (BMI): 28.8 Blood Pressure (mmHg): 142/90 Reference Range: 80 - 120 mg / dl Electronic Signature(s) Signed: 08/15/2021 12:56:33 PM By: [name] Entered By: [name] on 08/10/2021 09:25:19

## 2021-08-24 ENCOUNTER — Ambulatory Visit: Payer: Self-pay | Admitting: Internal Medicine

## 2021-08-31 ENCOUNTER — Encounter: Payer: Self-pay | Attending: Physician Assistant | Admitting: Physician Assistant

## 2021-08-31 ENCOUNTER — Other Ambulatory Visit: Payer: Self-pay

## 2021-08-31 DIAGNOSIS — I872 Venous insufficiency (chronic) (peripheral): Secondary | ICD-10-CM | POA: Insufficient documentation

## 2021-08-31 DIAGNOSIS — L97812 Non-pressure chronic ulcer of other part of right lower leg with fat layer exposed: Secondary | ICD-10-CM | POA: Insufficient documentation

## 2021-08-31 DIAGNOSIS — Z09 Encounter for follow-up examination after completed treatment for conditions other than malignant neoplasm: Secondary | ICD-10-CM | POA: Insufficient documentation

## 2021-08-31 NOTE — Progress Notes (Addendum)
LATOYA, DISKIN ([id number]) Visit Report for 08/31/2021 Chief Complaint Document Details Patient Name: [name], [name]. Date of Service: 08/31/2021 9:00 AM Medical Record Number: [id number] Patient Account Number: [id number] Date of Birth/Sex: 1971/08/21 (51 y.o. M) Treating RN: Levora Dredge Primary Care Provider: PATIENT, NO Other Clinician: Referring Provider: Barkley Boards Treating Provider/Extender: Skipper Cliche in Treatment: 33 Information Obtained from: Patient Chief Complaint Right LE Ulcer Electronic Signature(s) Signed: 08/31/2021 9:25:52 AM By: Worthy Keeler PA-C Entered By: Worthy Keeler on 08/31/2021 09:25:51 Gates, [place] ([id number]) -------------------------------------------------------------------------------- HPI Details Patient Name: Nicolas Cousin L. Date of Service: 08/31/2021 9:00 AM Medical Record Number: [id number] Patient Account Number: [id number] Date of Birth/Sex: 1971-03-06 (51 y.o. M) Treating RN: Levora Dredge Primary Care Provider: PATIENT, NO Other Clinician: Referring Provider: Barkley Boards Treating Provider/Extender: Skipper Cliche in Treatment: 33 History of Present Illness HPI Description: 01/12/2021 upon evaluation today patient appears to be doing somewhat poorly in regard to a wound on his right medial lower leg. Currently he tells me that this has been going on since around the beginning of April. He has done a telehealth visit with a physician who initially gave him a triamcinolone cream along with Keflex for 5 days. Subsequently he ended up being seen at walk-in urgent care where they also given Keflex for 7 days that seem to be doing better for him. Overall he tells me that things have improved from the standpoint of redness. Fortunately there does not appear to be any signs of systemic infection to be honest right now even locally I do not see any evidence of infection right now. He does have 1 main wound with some scattering areas  that look like they may have been small ulcerations but again for the most part these have filled in. This does look almost to be more of a vasculitis type scenario based on what I am seeing. Patient does have chronic venous insufficiency but is not wearing any compression even though it sounds like he has been told before he should. 5/27; patient with small wounds on his right medial ankle. He has chronic venous insufficiency and stasis dermatitis a large open wound and several smaller areas all of which looks better than on presentation last week according to her nursing staff 01/26/2021 upon evaluation today patient appears to be doing well with regard to his wound. He is making good progress. With that being said there is some need currently for sharp debridement in regard to the wound there is some necrotic debris it is almost completely cleaned off but not completely. He is in agreement with that plan today. Fortunately there does not appear to be any evidence of active infection which is great and I am very pleased in that regard. He still wishes he did not have to wear the compression wrap. He did get his compression socks from [facility] though they are in the 15 to 20 mmHg range they did not have any his length as he is a tall guy in the 20 to 30 mmHg range. 02/02/2021 upon evaluation today patient appears to be doing excellent in regard to his leg ulcer. He has been tolerating the dressing changes without complication. Fortunately there is no evidence of active infection at this time. No fevers, chills, nausea, vomiting, or diarrhea. 02/09/2021 upon evaluation today patient's wound actually showing signs of improvement. Fortunately there does not appear to be any evidence of active infection which is great news overall very pleased with where  things stand today. 02/16/2021 upon evaluation today patient's wound is showing some signs of improvement. He did have a fairly aggressive debridement  last week he notes that he had quite a bit of discomfort he was some swelling following. With that being said I think this was to be expected considering what we had to do from a debridement standpoint. The good news is there does not appear to be any evidence of infection currently nonetheless I do believe that the patient may have some signs of vasculitis here. Based on the overall appearance of the wound today and beginning to suspect this. For that reason I think he would benefit from starting him on triamcinolone ointment to the wound bed and some of the immediate periwound to try to help out in this regard. 02/23/2021 upon evaluation today patient appears to be doing well with regard to his wound. He is actually making some progress here. The wound is roughly about the same size but the overall appearance of the wound bed is much improved. I do not see any signs of active infection at this time which is great news. No fevers, chills, nausea, vomiting, or diarrhea. 03/02/2021 upon evaluation today patient appears to be doing well with regard to his wound. I think the biggest issue I see right now is that with Hydrofera Blue this wound is staying somewhat dry. I think he could benefit from the use of collagen to try to help with getting this to stimulate some additional growth. He is in agreement with the plan. I think this also had a little bit more moisture which would be helpful at this point. 03/09/2021 on evaluation today patient appears to be doing about the same in regard to his wound is measuring a little bit smaller but still he does have the surface of the wound which is not quite as healthy as I would like to see. Fortunately there does not appear to be any active infection at this time. No fevers, chills, nausea, vomiting, or diarrhea. 03/16/2021 upon evaluation today patient appears to be doing better with regard to the overall appearance of his wound bed. There does not appear to be any  signs of significant slough buildup I think the Iodoflex has done very well for him. The patient's happy to hear this and see the improvement. With that being said he is continuing to have a little bit of expansion of the wound again I think this is more related to #1 edema control and #2 the fact that again we will get the wound bed clean. I feel like were pretty much today as far as cleaning up the wound bed is concerned now we just need to make sure the edema is optimized. I am just not certain that his compression socks are doing the job. 03/23/2021 upon evaluation today patient unfortunately appears to be doing a little bit worse with regard to his wound I was actually expecting this to be much better this week. With that being said I am getting concerned about the possibility of pyoderma gangrenosum. I did print off some information for the patient in this regard. Subsequently I am also can see about starting him on some steroid cream as well as an oral steroid. 03/30/2021 upon evaluation today patient appears to be doing significantly better in regard to his wound. He has started using the clobetasol which seems to have made a excellent improvement in regard to the wound today. 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 Either way at this point I would recommend that we avoid sharp debridements I think that is probably can be the best option based on what I am seeing currently. The patient voiced understanding. He also had questions about taking the prednisone with regard to his immune system. I explained that I do believe that that can be an issue long-term but for short-term which is mainly what we are doing here I do not think it is going to be a major issue for him and in fact I think would help more especially considering  the pyoderma here and the fact that is more of an autoimmune type issue than not using the prednisone. 04/06/2021 upon evaluation today patient's wound actually showed signs of excellent improvement. This definitely has gone a lot better since we started with the topical steroids and overall I am extremely pleased with where things stand. No fevers, chills, nausea, vomiting, or diarrhea. With that being said the patient continues to feel much better and states that the pain is actually dramatically improved as well. This definitely appears [name], [name] ([id number]) to be a pyoderma situation. 04/13/2021 upon evaluation today patient appears to be doing well with regard to his wound. Fortunately there does not appear to be any signs of active infection at this time. No fevers, chills, nausea, vomiting, or diarrhea. With that being said I do believe that he is overall doing extremely well. This is measuring smaller and does seem to be headed in the right direction. 04/20/2021 upon evaluation today patient appears to be doing well with regard to his wound. He still has quite a bit of slough covering the surface of the wound. With that being said we previously debrided the wound he actually had a bit of worsening and therefore I am somewhat reluctant to proceed with any further debridement at this point. In fact this got quite a bit larger postdebridement. 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 Nonetheless I think it is can to take time to get this to heal. 04/27/2021 upon evaluation today patient appears to be doing well with regard to his wound. This is measuring smaller this week yet again and overall I am extremely pleased with where we stand currently. Fortunately there does not appear to be any evidence of active infection which is great news. No fevers, chills, nausea, vomiting, or diarrhea. 05/04/2021 upon evaluation today patient's wound actually has a significant amount of biofilm  noted on the surface of the wound. Fortunately there does not appear to be any evidence of active infection at this time which is great news and overall I am extremely pleased with where we stand today. However I do think we may need to try to see about a sharp debridement to clear away some of the slough and biofilm on the surface of the wound to try to help this to heal more effectively and quickly. Patient voiced understanding. 05/11/2021 upon evaluation today patient appears to be doing better in regard to his wound. He has been tolerating the dressing changes without complication. Fortunately there does not appear to be any signs of active infection at this time which is great news. No fever chills noted 05/25/2021 upon evaluation today patient's wound is actually showing signs of excellent improvement. Fortunately there does not appear to  be any evidence of infection at this time which is great news and overall I am extremely pleased with where we stand currently. I do feel like that the patient is improving although is very slow where little by little seen in this improvement. 06/01/2021 upon evaluation today patient appears to be doing well with regard to his wound. She has been tolerating the dressing changes without complication. With that being said it somewhat slow to heal at this point. I do think we could try to switch things up a little bit and see about using collagen in place that what we have been doing. The patient is in agreement with that plan. 06/08/2021 upon evaluation today patient appears to be doing well with regard to his wound I do feel like the collagen is helping with a little additional granulation. Fortunately there does not appear to be any signs of infection which is great news. No fevers, chills, nausea, vomiting, or diarrhea. 06/15/2021 upon evaluation today patient appears to be doing well with regard to his wound. In fact this is showing signs of improvement  is going slowly but nonetheless making good progress in my opinion. I do not see any signs of active infection at this time. No fevers, chills, nausea, vomiting, or diarrhea. 06/29/2021 upon evaluation today patient appears to be doing well with regard to his leg ulcer. He has been tolerating the dressing changes without complication. Fortunately there does not appear to be any evidence of active infection which is great and overall I am extremely pleased with where we stand today. He still is using the clobetasol just a small amount over top of the collagen. 07/13/2021 patient's wound bed actually showed signs currently of doing okay there does not appear to be any signs of infection which is great news. No fevers, chills, nausea, vomiting, or diarrhea. 07/27/2021 upon evaluation today patient's wound is actually showing signs of improvement which is great news. This is measuring smaller significantly and looks much better. I think stopping the clobetasol at this point was a good call. Obviously he is making good progress. The collagen does seem to be doing an awesome job. 08/10/2021 upon evaluation today patient appears to be doing awesome in regard to his wound. He is definitely making some good progress here. I am very pleased with where we stand. 08/31/2021 upon evaluation today patient appears to be doing well with regard to his wound infection this appears to be completely healed which is also news. Electronic Signature(s) Signed: 08/31/2021 9:49:25 AM By: Worthy Keeler PA-C Entered By: Worthy Keeler on 08/31/2021 09:49:24 [name] ([id number]) -------------------------------------------------------------------------------- Physical Exam Details Patient Name: [name], [name] Date of Service: 08/31/2021 9:00 AM Medical Record Number: [id number] Patient Account Number: [id number] Date of Birth/Sex: 1971-06-16 (51 y.o. M) Treating RN: Levora Dredge Primary Care Provider: PATIENT, NO  Other Clinician: Referring Provider: Barkley Boards Treating Provider/Extender: Skipper Cliche in Treatment: 55 Constitutional Well-nourished and well-hydrated in no acute distress. Respiratory normal breathing without difficulty. Psychiatric this patient is able to make decisions and demonstrates good insight into disease process. Alert and Oriented x 3. pleasant and cooperative. Notes Upon inspection patient's wound bed showed signs of good granulation and epithelization at this point in fact I do not see anything open and I think he is completely healed. He does want see his back in 2 weeks just to make sure it stays that way which I completely understand. Electronic Signature(s) Signed: 08/31/2021 9:49:44 AM By: Melburn Hake,  Nicolas Butsch PA-C Entered By: Worthy Keeler on 08/31/2021 09:49:43 [name] ([id number]) -------------------------------------------------------------------------------- Physician Orders Details Patient Name: [name], [name] Date of Service: 08/31/2021 9:00 AM Medical Record Number: [id number] Patient Account Number: [id number] Date of Birth/Sex: Nov 03, 1970 (51 y.o. M) Treating RN: Levora Dredge Primary Care Provider: PATIENT, NO Other Clinician: Referring Provider: Barkley Boards Treating Provider/Extender: Skipper Cliche in Treatment: 66 Verbal / Phone Orders: No Diagnosis Coding ICD-10 Coding Code Description I87.2 Venous insufficiency (chronic) (peripheral) L97.812 Non-pressure chronic ulcer of other part of right lower leg with fat layer exposed Follow-up Appointments o Return Appointment in 2 weeks. - To check on healed wound Bathing/ Shower/ Hygiene o May shower; gently cleanse wound with antibacterial soap, rinse and pat dry prior to dressing wounds o Other: - avoid hot tubs, pools and oceans Edema Control - Lymphedema / Segmental Compressive Device / Other Bilateral Lower Extremities o Patient to wear own compression stockings. Remove  compression stockings every night before going to bed and put on every morning when getting up. o Elevate, Exercise Daily and Avoid Standing for Long Periods of Time. o Elevate legs to the level of the heart and pump ankles as often as possible o Elevate leg(s) parallel to the floor when sitting. o DO YOUR BEST to sleep in the bed at night. DO NOT sleep in your recliner. Long hours of sitting in a recliner leads to swelling of the legs and/or potential wounds on your backside. o Other: - Wear protective dressing to healed wound for a week and then may remove. May take off at night and apply AD ointment and reapply protective dressing in the morning. If dressing becomes wet please do change and cover with protective band aid. May continue to use AD ointment on right lower extremity. Additional Orders / Instructions o Follow Nutritious Diet and Increase Protein Intake Electronic Signature(s) Signed: 08/31/2021 5:21:10 PM By: Worthy Keeler PA-C Signed: 09/03/2021 9:27:28 AM By: Levora Dredge Entered By: Levora Dredge on 08/31/2021 09:45:53 [name], [name] ([id number]) -------------------------------------------------------------------------------- Problem List Details Patient Name: [name], [name] Date of Service: 08/31/2021 9:00 AM Medical Record Number: [id number] Patient Account Number: [id number] Date of Birth/Sex: [birth year] (51 y.o. M) Treating RN: Levora Dredge Primary Care Provider: PATIENT, NO Other Clinician: Referring Provider: Barkley Boards Treating Provider/Extender: Skipper Cliche in Treatment: 33 Active Problems ICD-10 Encounter Code Description Active Date MDM Diagnosis I87.2 Venous insufficiency (chronic) (peripheral) 01/12/2021 No Yes L97.812 Non-pressure chronic ulcer of other part of right lower leg with fat layer 01/12/2021 No Yes exposed Inactive Problems Resolved Problems Electronic Signature(s) Signed: 08/31/2021 9:25:48 AM By: Worthy Keeler  PA-C Entered By: Worthy Keeler on 08/31/2021 09:25:47 [name], [name] ([id number]) -------------------------------------------------------------------------------- Progress Note Details Patient Name: Nicolas Cousin L. Date of Service: 08/31/2021 9:00 AM Medical Record Number: [id number] Patient Account Number: [id number] Date of Birth/Sex: 22-Aug-1971 (51 y.o. M) Treating RN: Levora Dredge Primary Care Provider: PATIENT, NO Other Clinician: Referring Provider: Barkley Boards Treating Provider/Extender: Skipper Cliche in Treatment: 33 Subjective Chief Complaint Information obtained from Patient Right LE Ulcer History of Present Illness (HPI) 01/12/2021 upon evaluation today patient appears to be doing somewhat poorly in regard to a wound on his right medial lower leg. Currently he tells me that this has been going on since around the beginning of April. He has done a telehealth visit with a physician who initially gave him a triamcinolone cream along with Keflex for 5 days. Subsequently he ended up being seen at  walk-in urgent care where they also given Keflex for 7 days that seem to be doing better for him. Overall he tells me that things have improved from the standpoint of redness. Fortunately there does not appear to be any signs of systemic infection to be honest right now even locally I do not see any evidence of infection right now. He does have 1 main wound with some scattering areas that look like they may have been small ulcerations but again for the most part these have filled in. This does look almost to be more of a vasculitis type scenario based on what I am seeing. Patient does have chronic venous insufficiency but is not wearing any compression even though it sounds like he has been told before he should. 5/27; patient with small wounds on his right medial ankle. He has chronic venous insufficiency and stasis dermatitis a large open wound and several smaller areas all of which  looks better than on presentation last week according to her nursing staff 01/26/2021 upon evaluation today patient appears to be doing well with regard to his wound. He is making good progress. With that being said there is some need currently for sharp debridement in regard to the wound there is some necrotic debris it is almost completely cleaned off but not completely. He is in agreement with that plan today. Fortunately there does not appear to be any evidence of active infection which is great and I am very pleased in that regard. He still wishes he did not have to wear the compression wrap. He did get his compression socks from [facility] therapy though they are in the 15 to 20 mmHg range they did not have any his length as he is a tall guy in the 20 to 30 mmHg range. 02/02/2021 upon evaluation today patient appears to be doing excellent in regard to his leg ulcer. He has been tolerating the dressing changes without complication. Fortunately there is no evidence of active infection at this time. No fevers, chills, nausea, vomiting, or diarrhea. 02/09/2021 upon evaluation today patient's wound actually showing signs of improvement. Fortunately there does not appear to be any evidence of active infection which is great news overall very pleased with where things stand today. 02/16/2021 upon evaluation today patient's wound is showing some signs of improvement. He did have a fairly aggressive debridement last week he notes that he had quite a bit of discomfort he was some swelling following. With that being said I think this was to be expected considering what we had to do from a debridement standpoint. The good news is there does not appear to be any evidence of infection currently nonetheless I do believe that the patient may have some signs of vasculitis here. Based on the overall appearance of the wound today and beginning to suspect this. For that reason I think he would benefit from starting him on  triamcinolone ointment to the wound bed and some of the immediate periwound to try to help out in this regard. 02/23/2021 upon evaluation today patient appears to be doing well with regard to his wound. He is actually making some progress here. The wound is roughly about the same size but the overall appearance of the wound bed is much improved. I do not see any signs of active infection at this time which is great news. No fevers, chills, nausea, vomiting, or diarrhea. 03/02/2021 upon evaluation today patient appears to be doing well with regard to his wound. I think the  biggest issue I see right now is that with Hydrofera Blue this wound is staying somewhat dry. I think he could benefit from the use of collagen to try to help with getting this to stimulate some additional growth. He is in agreement with the plan. I think this also had a little bit more moisture which would be helpful at this point. 03/09/2021 on evaluation today patient appears to be doing about the same in regard to his wound is measuring a little bit smaller but still he does have the surface of the wound which is not quite as healthy as I would like to see. Fortunately there does not appear to be any active infection at this time. No fevers, chills, nausea, vomiting, or diarrhea. 03/16/2021 upon evaluation today patient appears to be doing better with regard to the overall appearance of his wound bed. There does not appear to be any signs of significant slough buildup I think the Iodoflex has done very well for him. The patient's happy to hear this and see the improvement. With that being said he is continuing to have a little bit of expansion of the wound again I think this is more related to #1 edema control and #2 the fact that again we will get the wound bed clean. I feel like were pretty much today as far as cleaning up the wound bed is concerned now we just need to make sure the edema is optimized. I am just not certain that his  compression socks are doing the job. 03/23/2021 upon evaluation today patient unfortunately appears to be doing a little bit worse with regard to his wound I was actually expecting this to be much better this week. With that being said I am getting concerned about the possibility of pyoderma gangrenosum. I did print off some information for the patient in this regard. Subsequently I am also can see about starting him on some steroid cream as well as an oral steroid. 03/30/2021 upon evaluation today patient appears to be doing significantly better in regard to his wound. He has started using the clobetasol which seems to have made a excellent improvement in regard to the wound today. 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 Either way at this point I would recommend that we avoid sharp debridements I think that is probably can be the best option based on what I am seeing currently. The patient voiced understanding. He also had questions about taking the prednisone with regard to his immune system. I explained that I do believe that that can be an issue long-term but for short-term which is mainly what we are doing here I do not think it is going to be a major issue for him and in fact I think would help more especially considering the pyoderma here and the fact that is more of an [place], [place] ([id number]) autoimmune type issue than not using the prednisone. 04/06/2021 upon evaluation today patient's wound actually showed signs of excellent improvement. This definitely has gone a lot better since we started with the topical steroids and overall I am extremely pleased with where things stand. No fevers, chills, nausea, vomiting, or diarrhea. With that being said the patient continues to feel much better and states that the pain is  actually dramatically improved as well. This  definitely appears to be a pyoderma situation. 04/13/2021 upon evaluation today patient appears to be doing well with regard to his wound. Fortunately there does not appear to be any signs of active infection at this time. No fevers, chills, nausea, vomiting, or diarrhea. With that being said I do believe that he is overall doing extremely well. This is measuring smaller and does seem to be headed in the right direction. 04/20/2021 upon evaluation today patient appears to be doing well with regard to his wound. He still has quite a bit of slough covering the surface of the wound. With that being said we previously debrided the wound he actually had a bit of worsening and therefore I am somewhat reluctant to proceed with any further debridement at this point. In fact this got quite a bit larger postdebridement. 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 Nonetheless I think it is can to take time to get this to heal. 04/27/2021 upon evaluation today patient appears to be doing well with regard to his wound. This is measuring smaller this week yet again and overall I am extremely pleased with where we stand currently. Fortunately there does not appear to be any evidence of active infection which is great news. No fevers, chills, nausea, vomiting, or diarrhea. 05/04/2021 upon evaluation today patient's wound actually has a significant amount of biofilm noted on the surface of the wound. Fortunately there does not appear to be any evidence of active infection at this time which is great news and overall I am extremely pleased with where we stand today. However I do think we may need to try to see about a sharp debridement to clear away some of the slough and biofilm on the surface of the wound to try to help this to heal more effectively and quickly. Patient voiced understanding. 05/11/2021 upon evaluation today patient appears to be doing better in regard  to his wound. He has been tolerating the dressing changes without complication. Fortunately there does not appear to be any signs of active infection at this time which is great news. No fever chills noted 05/25/2021 upon evaluation today patient's wound is actually showing signs of excellent improvement. Fortunately there does not appear to be any evidence of infection at this time which is great news and overall I am extremely pleased with where we stand currently. I do feel like that the patient is improving although is very slow where little by little seen in this improvement. 06/01/2021 upon evaluation today patient appears to be doing well with regard to his wound. She has been tolerating the dressing changes without complication. With that being said it somewhat slow to heal at this point. I do think we could try to switch things up a little bit and see about using collagen in place that what we have been doing. The patient is in agreement with that plan. 06/08/2021 upon evaluation today patient appears to be doing well with regard to his wound I do feel like the collagen is helping with a little additional granulation. Fortunately there does not appear to be any signs of infection which is great news. No fevers, chills, nausea, vomiting, or diarrhea. 06/15/2021 upon evaluation today patient appears to be doing well with regard to his wound. In fact this is showing signs of improvement is going slowly but nonetheless making good progress in my opinion. I do not see any signs of active infection at this time. No fevers, chills, nausea, vomiting, or diarrhea. 06/29/2021  upon evaluation today patient appears to be doing well with regard to his leg ulcer. He has been tolerating the dressing changes without complication. Fortunately there does not appear to be any evidence of active infection which is great and overall I am extremely pleased with where we stand today. He still is using the clobetasol  just a small amount over top of the collagen. 07/13/2021 patient's wound bed actually showed signs currently of doing okay there does not appear to be any signs of infection which is great news. No fevers, chills, nausea, vomiting, or diarrhea. 07/27/2021 upon evaluation today patient's wound is actually showing signs of improvement which is great news. This is measuring smaller significantly and looks much better. I think stopping the clobetasol at this point was a good call. Obviously he is making good progress. The collagen does seem to be doing an awesome job. 08/10/2021 upon evaluation today patient appears to be doing awesome in regard to his wound. He is definitely making some good progress here. I am very pleased with where we stand. 08/31/2021 upon evaluation today patient appears to be doing well with regard to his wound infection this appears to be completely healed which is also news. Objective Constitutional Well-nourished and well-hydrated in no acute distress. Vitals Time Taken: 9:15 AM, Height: 78 in, Weight: 249 lbs, BMI: 28.8, Temperature: 98.2 F, Pulse: 75 bpm, Respiratory Rate: 18 breaths/min, Blood Pressure: 145/83 mmHg. [name], [name] ([id number]) Respiratory normal breathing without difficulty. Psychiatric this patient is able to make decisions and demonstrates good insight into disease process. Alert and Oriented x 3. pleasant and cooperative. General Notes: Upon inspection patient's wound bed showed signs of good granulation and epithelization at this point in fact I do not see anything open and I think he is completely healed. He does want see his back in 2 weeks just to make sure it stays that way which I completely understand. Integumentary (Hair, Skin) Wound #1 status is Healed - Epithelialized. Original cause of wound was Gradually Appeared. The date acquired was: 11/24/2020. The wound has been in treatment 33 weeks. The wound is located on the Right,Medial Lower  Leg. The wound measures 0cm length x 0cm width x 0cm depth; 0cm^2 area and 0cm^3 volume. There is no tunneling or undermining noted. There is a small amount of serosanguineous drainage noted. The wound margin is flat and intact. There is no granulation within the wound bed. There is a large (67-100%) amount of necrotic tissue within the wound bed including Eschar. Assessment Active Problems ICD-10 Venous insufficiency (chronic) (peripheral) Non-pressure chronic ulcer of other part of right lower leg with fat layer exposed Plan Follow-up Appointments: Return Appointment in 2 weeks. - To check on healed wound Bathing/ Shower/ Hygiene: May shower; gently cleanse wound with antibacterial soap, rinse and pat dry prior to dressing wounds Other: - avoid hot tubs, pools and oceans Edema Control - Lymphedema / Segmental Compressive Device / Other: Patient to wear own compression stockings. Remove compression stockings every night before going to bed and put on every morning when getting up. Elevate, Exercise Daily and Avoid Standing for Long Periods of Time. Elevate legs to the level of the heart and pump ankles as often as possible Elevate leg(s) parallel to the floor when sitting. DO YOUR BEST to sleep in the bed at night. DO NOT sleep in your recliner. Long hours of sitting in a recliner leads to swelling of the legs and/or potential wounds on your backside. Other: - Wear protective  dressing to healed wound for a week and then may remove. May take off at night and apply AD ointment and reapply protective dressing in the morning. If dressing becomes wet please do change and cover with protective band aid. May continue to use AD ointment on right lower extremity. Additional Orders / Instructions: Follow Nutritious Diet and Increase Protein Intake 1. I would recommend currently that we going to continue with the wound care measures as before and the patient is in agreement with the plan this  includes the use of the protective dressing. He does not need any medication such as collagen at this point. 2. I am also can recommend he continue with AandD ointment around the edges of the bandage. 3. I would also suggest he continue with his compression stocking. After 1 week he can just use a compression stocking only with no bandage for protection to cover. We will see patient back for reevaluation in 2 weeks here in the clinic. If anything worsens or changes patient will contact our office for additional recommendations. Electronic Signature(s) Signed: 08/31/2021 9:50:28 AM By: Worthy Keeler PA-C Entered By: Worthy Keeler on 08/31/2021 09:50:28 [name], [name] ([id number]) -------------------------------------------------------------------------------- SuperBill Details Patient Name: Nicolas Cousin L. Date of Service: 08/31/2021 Medical Record Number: [id number] Patient Account Number: [id number] Date of Birth/Sex: [birth year] (52 y.o. M) Treating RN: Levora Dredge Primary Care Provider: PATIENT, NO Other Clinician: Referring Provider: Barkley Boards Treating Provider/Extender: Skipper Cliche in Treatment: 33 Diagnosis Coding ICD-10 Codes Code Description I87.2 Venous insufficiency (chronic) (peripheral) L97.812 Non-pressure chronic ulcer of other part of right lower leg with fat layer exposed Facility Procedures CPT4 Code: [id number] Description: [id number] - WOUND CARE VISIT-LEV 2 EST PT Modifier: Quantity: 1 Physician Procedures CPT4 Code: 6314970 Description: 26378 - WC PHYS LEVEL 3 - EST PT Modifier: Quantity: 1 CPT4 Code: Description: ICD-10 Diagnosis Description I87.2 Venous insufficiency (chronic) (peripheral) L97.812 Non-pressure chronic ulcer of other part of right lower leg with fat la Modifier: yer exposed Quantity: Electronic Signature(s) Signed: 08/31/2021 9:50:37 AM By: Worthy Keeler PA-C Entered By: Worthy Keeler on 08/31/2021 09:50:37

## 2021-09-03 NOTE — Progress Notes (Signed)
ATWOOD, ADCOCK ([id number]) Visit Report for 08/31/2021 Arrival Information Details Patient Name: [name], [name]. Date of Service: 08/31/2021 9:00 AM Medical Record Number: [id number] Patient Account Number: [id number] Date of Birth/Sex: [birth year] (51 y.o. M) Treating RN: [name] Primary Care [name]: PATIENT, NO Other Clinician: Referring [name]: [name] Treating [name]/Extender: [name] in Treatment: 33 Visit Information History Since Last Visit Added or deleted any medications: No Patient Arrived: Ambulatory Any new allergies or adverse reactions: No Arrival Time: 09:12 Had a fall or experienced change in No Accompanied By: self activities of daily living that may affect Transfer Assistance: None risk of falls: Patient Identification Verified: Yes Hospitalized since last visit: No Secondary Verification Process Completed: Yes Has Dressing in Place as Prescribed: Yes Patient Requires Transmission-Based Precautions: No Pain Present Now: No Patient Has Alerts: Yes Patient Alerts: NOT DIABETIC Electronic Signature(s) Signed: 09/03/2021 9:27:28 AM By: [name] Entered By: [name] on 08/31/2021 09:15:54 [place], [place]. ([id number]) -------------------------------------------------------------------------------- Clinic Level of Care Assessment Details Patient Name: Nicolas Cousin L. Date of Service: 08/31/2021 9:00 AM Medical Record Number: [id number] Patient Account Number: [id number] Date of Birth/Sex: 26-Mar-1971 (51 y.o. M) Treating RN: [name] Primary Care [name]: PATIENT, NO Other Clinician: Referring [name]: [name] Treating [name]/Extender: [name] in Treatment: 33 Clinic Level of Care Assessment Items TOOL 4 Quantity Score X - Use when only an EandM is performed on FOLLOW-UP visit 1 0 ASSESSMENTS - Nursing Assessment / Reassessment []  - Reassessment of Co-morbidities (includes updates in patient status)  0 []  - 0 Reassessment of Adherence to Treatment Plan ASSESSMENTS - Wound and Skin Assessment / Reassessment X - Simple Wound Assessment / Reassessment - one wound 1 5 []  - 0 Complex Wound Assessment / Reassessment - multiple wounds []  - 0 Dermatologic / Skin Assessment (not related to wound area) ASSESSMENTS - Focused Assessment []  - Circumferential Edema Measurements - multi extremities 0 []  - 0 Nutritional Assessment / Counseling / Intervention []  - 0 Lower Extremity Assessment (monofilament, tuning fork, pulses) []  - 0 Peripheral Arterial Disease Assessment (using hand held doppler) ASSESSMENTS - Ostomy and/or Continence Assessment and Care []  - Incontinence Assessment and Management 0 []  - 0 Ostomy Care Assessment and Management (repouching, etc.) PROCESS - Coordination of Care X - Simple Patient / Family Education for ongoing care 1 15 []  - 0 Complex (extensive) Patient / Family Education for ongoing care []  - 0 Staff obtains Programmer, systems, Records, Test Results / Process Orders []  - 0 Staff telephones HHA, Nursing Homes / Clarify orders / etc []  - 0 Routine Transfer to another Facility (non-emergent condition) []  - 0 Routine Hospital Admission (non-emergent condition) []  - 0 New Admissions / Biomedical engineer / Ordering NPWT, Apligraf, etc. []  - 0 Emergency Hospital Admission (emergent condition) X- 1 10 Simple Discharge Coordination []  - 0 Complex (extensive) Discharge Coordination PROCESS - Special Needs []  - Pediatric / Minor Patient Management 0 []  - 0 Isolation Patient Management []  - 0 Hearing / Language / Visual special needs []  - 0 Assessment of Community assistance (transportation, D/C planning, etc.) []  - 0 Additional assistance / Altered mentation []  - 0 Support Surface(s) Assessment (bed, cushion, seat, etc.) INTERVENTIONS - Wound Cleansing / Measurement [name], [name] ([id number]) X- 1 5 Simple Wound Cleansing - one wound []  - 0 Complex  Wound Cleansing - multiple wounds X- 1 5 Wound Imaging (photographs - any number of wounds) []  - 0 Wound Tracing (instead of photographs) X- 1 5 Simple  Wound Measurement - one wound []  - 0 Complex Wound Measurement - multiple wounds INTERVENTIONS - Wound Dressings X - Small Wound Dressing one or multiple wounds 1 10 []  - 0 Medium Wound Dressing one or multiple wounds []  - 0 Large Wound Dressing one or multiple wounds []  - 0 Application of Medications - topical []  - 0 Application of Medications - injection INTERVENTIONS - Miscellaneous []  - External ear exam 0 []  - 0 Specimen Collection (cultures, biopsies, blood, body fluids, etc.) []  - 0 Specimen(s) / Culture(s) sent or taken to Lab for analysis []  - 0 Patient Transfer (multiple staff / Civil Service fast streamer / Similar devices) []  - 0 Simple Staple / Suture removal (25 or less) []  - 0 Complex Staple / Suture removal (26 or more) []  - 0 Hypo / Hyperglycemic Management (close monitor of Blood Glucose) []  - 0 Ankle / Brachial Index (ABI) - do not check if billed separately X- 1 5 Vital Signs Has the patient been seen at the hospital within the last three years: Yes Total Score: 60 Level Of Care: New/Established - Level 2 Electronic Signature(s) Signed: 09/03/2021 9:27:28 AM By: [name] Entered By: [name] on 08/31/2021 09:49:30 [place], [name] ([id number]) -------------------------------------------------------------------------------- Encounter Discharge Information Details Patient Name: Nicolas Cousin L. Date of Service: 08/31/2021 9:00 AM Medical Record Number: [id number] Patient Account Number: [id number] Date of Birth/Sex: 09/20/1970 (51 y.o. M) Treating RN: [name] Primary Care [name]: PATIENT, NO Other Clinician: Referring [name]: [name] Treating Kyrra [name]/Extender: [name] in Treatment: 33 Encounter Discharge Information Items Discharge Condition: Stable Ambulatory Status:  Ambulatory Discharge Destination: Home Transportation: Other Accompanied By: self Schedule Follow-up Appointment: Yes Clinical Summary of Care: Electronic Signature(s) Signed: 09/03/2021 9:27:28 AM By: [name] Entered By: [name] on 08/31/2021 [place], [place] L. ([id number]) -------------------------------------------------------------------------------- Lower Extremity Assessment Details Patient Name: Nicolas Cousin L. Date of Service: 08/31/2021 9:00 AM Medical Record Number: [id number] Patient Account Number: [id number] Date of Birth/Sex: Apr 11, 1971 (51 y.o. M) Treating RN: [name] Primary Care Addisen Chappelle: PATIENT, NO Other Clinician: Referring Aaronjames Kelsay: [name] Treating Roxas Clymer/Extender: [name] in Treatment: 33 Edema Assessment Assessed: [Left: No] [Right: No] Edema: [Left: N] [Right: o] Vascular Assessment Pulses: Dorsalis Pedis Palpable: [Right:Yes] Posterior Tibial Palpable: [Right:Yes] Electronic Signature(s) Signed: 09/03/2021 9:27:28 AM By: [name] Entered By: [name] on 08/31/2021 09:24:31 [name], [name] ([id number]) -------------------------------------------------------------------------------- Multi Wound Chart Details Patient Name: Nicolas Cousin L. Date of Service: 08/31/2021 9:00 AM Medical Record Number: [id number] Patient Account Number: [id number] Date of Birth/Sex: May 02, 1971 (51 y.o. M) Treating RN: [name] Primary Care Amedeo Detweiler: PATIENT, NO Other Clinician: Referring Abdalla Naramore: [name] Treating Lenardo Westwood/Extender: [name] in Treatment: 33 Vital Signs Height(in): 78 Pulse(bpm): 75 Weight(lbs): 249 Blood Pressure(mmHg): 145/83 Body Mass Index(BMI): 29 Temperature(F): 98.2 Respiratory Rate(breaths/min): 18 Photos: [N/A:N/A] Wound Location: Right, Medial Lower Leg N/A N/A Wounding Event: Gradually Appeared N/A N/A Primary Etiology: Venous Leg Ulcer N/A N/A Date Acquired:  11/24/2020 N/A N/A Weeks of Treatment: 33 N/A N/A Wound Status: Open N/A N/A Measurements L x W x D (cm) 0.1x0.3x0.1 N/A N/A Area (cm) : 0.024 N/A N/A Volume (cm) : 0.002 N/A N/A % Reduction in Area: 97.20% N/A N/A % Reduction in Volume: 98.80% N/A N/A Classification: Full Thickness Without Exposed N/A N/A Support Structures Exudate Amount: Small N/A N/A Exudate Type: Serosanguineous N/A N/A Exudate Color: red, brown N/A N/A Wound Margin: Flat and Intact N/A N/A Granulation Amount: None Present (0%) N/A N/A Necrotic Amount: Large (67-100%)  N/A N/A Necrotic Tissue: Eschar N/A N/A Exposed Structures: Fascia: No N/A N/A Fat Layer (Subcutaneous Tissue): No Tendon: No Muscle: No Joint: No Bone: No Epithelialization: Small (1-33%) N/A N/A Treatment Notes Electronic Signature(s) Signed: 09/03/2021 9:27:28 AM By: [name] Entered By: [name] on 08/31/2021 09:40:34 [name], [name] ([id number]) -------------------------------------------------------------------------------- [place] Details Patient Name: [name], [name] Date of Service: 08/31/2021 9:00 AM Medical Record Number: [id number] Patient Account Number: [id number] Date of Birth/Sex: 1970/09/18 (51 y.o. M) Treating RN: [name] Primary Care Coline Calkin: PATIENT, NO Other Clinician: Referring Bingham Millette: [name] Treating Aashka Salomone/Extender: [name] in Treatment: 33 Active Inactive Necrotic Tissue Nursing Diagnoses: Impaired tissue integrity related to necrotic/devitalized tissue Knowledge deficit related to management of necrotic/devitalized tissue Goals: Necrotic/devitalized tissue will be minimized in the wound bed Date Initiated: 07/13/2021 Target Resolution Date: 07/13/2021 Goal Status: Active Patient/caregiver will verbalize understanding of reason and process for debridement of necrotic tissue Date Initiated: 07/13/2021 Target Resolution Date: 07/13/2021 Goal  Status: Active Interventions: Assess patient pain level pre-, during and [name] procedure and prior to discharge Provide education on necrotic tissue and debridement process Treatment Activities: Apply topical anesthetic as ordered : 07/13/2021 Notes: Electronic Signature(s) Signed: 09/03/2021 9:27:28 AM By: [name] Entered By: [name] on 08/31/2021 [place], [place] ([id number]) -------------------------------------------------------------------------------- Pain Assessment Details Patient Name: Nicolas Cousin L. Date of Service: 08/31/2021 9:00 AM Medical Record Number: [id number] Patient Account Number: [id number] Date of Birth/Sex: 1971-04-19 (51 y.o. M) Treating RN: [name] Primary Care Rehema Muffley: PATIENT, NO Other Clinician: Referring Jabarri Stefanelli: [name] Treating Dontrey Snellgrove/Extender: [name] in Treatment: 33 Active Problems Location of Pain Severity and Description of Pain Patient Has Paino No Site Locations Rate the pain. Current Pain Level: 0 Pain Management and Medication Current Pain Management: Electronic Signature(s) Signed: 09/03/2021 9:27:28 AM By: [name] Entered By: [name] on 08/31/2021 09:17:41 [name] ([id number]) -------------------------------------------------------------------------------- Patient/Caregiver Education Details Patient Name: Nicolas Cousin L. Date of Service: 08/31/2021 9:00 AM Medical Record Number: [id number] Patient Account Number: [id number] Date of Birth/Gender: 1971-03-28 (51 y.o. M) Treating RN: [name] Primary Care Physician: PATIENT, NO Other Clinician: Referring Physician: Barkley [name] Treating Physician/Extender: [name] in Treatment: 34 Education Assessment Education Provided To: Patient Education Topics Provided Wound/Skin Impairment: Handouts: Caring for Your Ulcer Methods: Explain/Verbal Responses: State content correctly Electronic  Signature(s) Signed: 09/03/2021 9:27:28 AM By: [name] Entered By: [name] on 08/31/2021 09:49:49 [name], [name] ([id number]) -------------------------------------------------------------------------------- Wound Assessment Details Patient Name: Nicolas Cousin L. Date of Service: 08/31/2021 9:00 AM Medical Record Number: [id number] Patient Account Number: [id number] Date of Birth/Sex: 1971-08-24 (51 y.o. M) Treating RN: [name] Primary Care Amarien Carne: PATIENT, NO Other Clinician: Referring [place]: [name] Treating [name]: [name] in Treatment: 33 Wound Status Wound Number: 1 Primary Etiology: Venous Leg Ulcer Wound Location: Right, Medial Lower Leg Wound Status: Healed - Epithelialized Wounding Event: Gradually Appeared Date Acquired: 11/24/2020 Weeks Of Treatment: 33 Clustered Wound: No Photos Wound Measurements Length: (cm) 0 Width: (cm) 0 Depth: (cm) 0 Area: (cm) Volume: (cm) % Reduction in Area: 100% % Reduction in Volume: 100% Epithelialization: Small (1-33%) 0 Tunneling: No 0 Undermining: No Wound Description Classification: Full Thickness Without Exposed Support Structu Wound Margin: Flat and Intact Exudate Amount: Small Exudate Type: Serosanguineous Exudate Color: red, brown res Foul Odor After Cleansing: No Slough/Fibrino Yes Wound Bed Granulation Amount: None Present (0%) Exposed Structure Necrotic Amount: Large (67-100%) Fascia Exposed: No Necrotic Quality: Eschar Fat Layer (Subcutaneous  Tissue) Exposed: No Tendon Exposed: No Muscle Exposed: No Joint Exposed: No Bone Exposed: No Treatment Notes Wound #1 (Lower Leg) Wound Laterality: Right, Medial Cleanser Peri-Wound Care Topical [name], [name] ([id number]) Primary Dressing Secondary Dressing Secured With Compression Wrap Compression Stockings Add-Ons Electronic Signature(s) Signed: 09/03/2021 9:27:28 AM By: [name] Entered By: [name] on 08/31/2021 09:44:12 [name], [name] ([id number]) -------------------------------------------------------------------------------- Vitals Details Patient Name: Nicolas Cousin L. Date of Service: 08/31/2021 9:00 AM Medical Record Number: [id number] Patient Account Number: [id number] Date of Birth/Sex: 13-Jul-1971 (51 y.o. M) Treating RN: [name] Primary Care Alicya Bena: PATIENT, NO Other Clinician: Referring Dotti Busey: [name] Treating Stein Windhorst/Extender: [name] in Treatment: 33 Vital Signs Time Taken: 09:15 Temperature (F): 98.2 Height (in): 78 Pulse (bpm): 75 Weight (lbs): 249 Respiratory Rate (breaths/min): 18 Body Mass Index (BMI): 28.8 Blood Pressure (mmHg): 145/83 Reference Range: 80 - 120 mg / dl Electronic Signature(s) Signed: 09/03/2021 9:27:28 AM By: [name] Entered By: [name] on 08/31/2021 09:17:34

## 2021-09-14 ENCOUNTER — Encounter: Payer: Self-pay | Admitting: Internal Medicine

## 2021-09-14 ENCOUNTER — Other Ambulatory Visit: Payer: Self-pay

## 2021-09-14 NOTE — Progress Notes (Signed)
TALBERT, TREMBATH ([id number]) Visit Report for 09/14/2021 Arrival Information Details Patient Name: [name], [name]. Date of Service: 09/14/2021 9:00 AM Medical Record Number: [id number] Patient Account Number: [id number] Date of Birth/Sex: [birth year] (51 y.o. M) Treating RN: Levora Dredge Primary Care Isadora Delorey: PATIENT, NO Other Clinician: Referring Izzabell Klasen: Barkley Boards Treating Junelle Hashemi/Extender: Tito Dine in Treatment: 19 Visit Information History Since Last Visit Added or deleted any medications: No Patient Arrived: Ambulatory Any new allergies or adverse reactions: No Arrival Time: 09:13 Had a fall or experienced change in No Accompanied By: self activities of daily living that may affect Transfer Assistance: None risk of falls: Patient Requires Transmission-Based Precautions: No Hospitalized since last visit: No Patient Has Alerts: Yes Has Dressing in Place as Prescribed: No Patient Alerts: NOT DIABETIC Pain Present Now: No Electronic Signature(s) Signed: 09/14/2021 4:47:00 PM By: Levora Dredge Entered By: Levora Dredge on 09/14/2021 09:13:48 [place], [place] ([id number]) -------------------------------------------------------------------------------- Clinic Level of Care Assessment Details Patient Name: Nicolas Cousin L. Date of Service: 09/14/2021 9:00 AM Medical Record Number: [id number] Patient Account Number: [id number] Date of Birth/Sex: 1971/07/19 (51 y.o. M) Treating RN: Levora Dredge Primary Care Elpidio Thielen: PATIENT, NO Other Clinician: Referring Maekayla Giorgio: Barkley Boards Treating Sephora Boyar/Extender: Tito Dine in Treatment: 35 Clinic Level of Care Assessment Items TOOL 4 Quantity Score X - Use when only an EandM is performed on FOLLOW-UP visit 1 0 ASSESSMENTS - Nursing Assessment / Reassessment []  - Reassessment of Co-morbidities (includes updates in patient status) 0 []  - 0 Reassessment of Adherence to Treatment Plan ASSESSMENTS -  Wound and Skin Assessment / Reassessment X - Simple Wound Assessment / Reassessment - one wound 1 5 []  - 0 Complex Wound Assessment / Reassessment - multiple wounds []  - 0 Dermatologic / Skin Assessment (not related to wound area) ASSESSMENTS - Focused Assessment []  - Circumferential Edema Measurements - multi extremities 0 []  - 0 Nutritional Assessment / Counseling / Intervention []  - 0 Lower Extremity Assessment (monofilament, tuning fork, pulses) []  - 0 Peripheral Arterial Disease Assessment (using hand held doppler) ASSESSMENTS - Ostomy and/or Continence Assessment and Care []  - Incontinence Assessment and Management 0 []  - 0 Ostomy Care Assessment and Management (repouching, etc.) PROCESS - Coordination of Care X - Simple Patient / Family Education for ongoing care 1 15 []  - 0 Complex (extensive) Patient / Family Education for ongoing care []  - 0 Staff obtains Programmer, systems, Records, Test Results / Process Orders []  - 0 Staff telephones HHA, Nursing Homes / Clarify orders / etc []  - 0 Routine Transfer to another Facility (non-emergent condition) []  - 0 Routine Hospital Admission (non-emergent condition) []  - 0 New Admissions / Biomedical engineer / Ordering NPWT, Apligraf, etc. []  - 0 Emergency Hospital Admission (emergent condition) X- 1 10 Simple Discharge Coordination []  - 0 Complex (extensive) Discharge Coordination PROCESS - Special Needs []  - Pediatric / Minor Patient Management 0 []  - 0 Isolation Patient Management []  - 0 Hearing / Language / Visual special needs []  - 0 Assessment of Community assistance (transportation, D/C planning, etc.) []  - 0 Additional assistance / Altered mentation []  - 0 Support Surface(s) Assessment (bed, cushion, seat, etc.) INTERVENTIONS - Wound Cleansing / Measurement Bir, Wladyslaw L. ([id number]) X- 1 5 Simple Wound Cleansing - one wound []  - 0 Complex Wound Cleansing - multiple wounds X- 1 5 Wound Imaging (photographs  - any number of wounds) []  - 0 Wound Tracing (instead of photographs) []  - 0 Simple Wound Measurement - one wound []  -  0 Complex Wound Measurement - multiple wounds INTERVENTIONS - Wound Dressings []  - Small Wound Dressing one or multiple wounds 0 []  - 0 Medium Wound Dressing one or multiple wounds []  - 0 Large Wound Dressing one or multiple wounds []  - 0 Application of Medications - topical []  - 0 Application of Medications - injection INTERVENTIONS - Miscellaneous []  - External ear exam 0 []  - 0 Specimen Collection (cultures, biopsies, blood, body fluids, etc.) []  - 0 Specimen(s) / Culture(s) sent or taken to Lab for analysis []  - 0 Patient Transfer (multiple staff / Civil Service fast streamer / Similar devices) []  - 0 Simple Staple / Suture removal (25 or less) []  - 0 Complex Staple / Suture removal (26 or more) []  - 0 Hypo / Hyperglycemic Management (close monitor of Blood Glucose) []  - 0 Ankle / Brachial Index (ABI) - do not check if billed separately X- 1 5 Vital Signs Has the patient been seen at the hospital within the last three years: Yes Total Score: 45 Level Of Care: New/Established - Level 2 Electronic Signature(s) Signed: 09/14/2021 4:47:00 PM By: Levora Dredge Entered By: Levora Dredge on 09/14/2021 09:42:09 [name], [name] ([id number]) -------------------------------------------------------------------------------- Encounter Discharge Information Details Patient Name: Nicolas Cousin L. Date of Service: 09/14/2021 9:00 AM Medical Record Number: [id number] Patient Account Number: [id number] Date of Birth/Sex: Mar 10, 1971 (51 y.o. M) Treating RN: Levora Dredge Primary Care Loring Liskey: PATIENT, NO Other Clinician: Referring Mariluz Crespo: Barkley Boards Treating Wylder Macomber/Extender: Tito Dine in Treatment: 61 Encounter Discharge Information Items Discharge Condition: Stable Ambulatory Status: Ambulatory Discharge Destination: Home Transportation: Private  Auto Accompanied By: self Schedule Follow-up Appointment: Yes Clinical Summary of Care: Electronic Signature(s) Signed: 09/14/2021 4:47:00 PM By: Levora Dredge Entered By: Levora Dredge on 09/14/2021 09:43:00 [place], [place] ([id number]) -------------------------------------------------------------------------------- Lower Extremity Assessment Details Patient Name: [name], [name] Date of Service: 09/14/2021 9:00 AM Medical Record Number: [id number] Patient Account Number: [id number] Date of Birth/Sex: 07/17/1971 (51 y.o. M) Treating RN: Levora Dredge Primary Care Leda [place]: PATIENT, NO Other Clinician: Referring Issaih Kaus: Barkley Boards Treating Austyn Seier/Extender: Ricard [name] Weeks in Treatment: 35 Edema Assessment Assessed: [Left: No] [Right: No] Edema: [Left: N] [Right: o] Vascular Assessment Pulses: Dorsalis Pedis Palpable: [Right:Yes] Electronic Signature(s) Signed: 09/14/2021 4:47:00 PM By: Levora Dredge Entered By: Levora Dredge on 09/14/2021 [place], [place]. ([id number]) -------------------------------------------------------------------------------- [place] Details Patient Name: [name], [name] Date of Service: 09/14/2021 9:00 AM Medical Record Number: [id number] Patient Account Number: [id number] Date of Birth/Sex: July 07, 1971 (51 y.o. M) Treating RN: Levora Dredge Primary Care Desa Rech: PATIENT, NO Other Clinician: Referring Hance Caspers: Barkley Boards Treating Bartholomew Ramesh/Extender: Tito Dine in Treatment: 35 Active Inactive Electronic Signature(s) Signed: 09/14/2021 4:47:00 PM By: Levora Dredge Entered By: Levora Dredge on 09/14/2021 09:39:00 [place], [place] ([id number]) -------------------------------------------------------------------------------- Pain Assessment Details Patient Name: Nicolas Cousin L. Date of Service: 09/14/2021 9:00 AM Medical Record Number: [id number] Patient Account Number: [id number] Date  of Birth/Sex: Oct 24, 1970 (51 y.o. M) Treating RN: Levora Dredge Primary Care Kherington Meraz: PATIENT, NO Other Clinician: Referring Nolyn Swab: Barkley Boards Treating Aymen Widrig/Extender: Tito Dine in Treatment: 35 Active Problems Location of Pain Severity and Description of Pain Patient Has Paino No Site Locations Rate the pain. Current Pain Level: 0 Pain Management and Medication Current Pain Management: Electronic Signature(s) Signed: 09/14/2021 4:47:00 PM By: Levora Dredge Entered By: Levora Dredge on 09/14/2021 09:16:11 [name] ([id number]) -------------------------------------------------------------------------------- Patient/Caregiver Education Details Patient Name: Nicolas Cousin L. Date of Service: 09/14/2021 9:00 AM Medical Record Number: [id number] Patient  Account Number: [id number] Date of Birth/Gender: 22-Apr-1971 (51 y.o. M) Treating RN: Levora Dredge Primary Care Physician: PATIENT, NO Other Clinician: Referring Physician: Barkley Boards Treating Physician/Extender: Tito Dine in Treatment: 75 Education Assessment Education Provided To: Patient Education Topics Provided Wound/Skin Impairment: Handouts: Caring for Your Ulcer Methods: Explain/Verbal Responses: State content correctly Electronic Signature(s) Signed: 09/14/2021 4:47:00 PM By: Levora Dredge Entered By: Levora Dredge on 09/14/2021 09:42:30 [place], [place] ([id number]) -------------------------------------------------------------------------------- Wound Assessment Details Patient Name: Nicolas Cousin L. Date of Service: 09/14/2021 9:00 AM Medical Record Number: [id number] Patient Account Number: [id number] Date of Birth/Sex: 26-Nov-1970 (51 y.o. M) Treating RN: Levora Dredge Primary Care Anvitha Hutmacher: PATIENT, NO Other Clinician: Referring Verle Brillhart: Barkley Boards Treating Marlen Mollica/Extender: Tito Dine in Treatment: 35 Wound Status Wound Number: 1 Primary  Etiology: Venous Leg Ulcer Wound Location: Right, Medial Lower Leg Wound Status: Healed - Epithelialized Wounding Event: Gradually Appeared Date Acquired: 11/24/2020 Weeks Of Treatment: 35 Clustered Wound: No Photos Wound Measurements Length: (cm) 0 Width: (cm) 0 Depth: (cm) 0 Area: (cm) 0 Volume: (cm) 0 % Reduction in Area: 100% % Reduction in Volume: 100% Epithelialization: Large (67-100%) Tunneling: No Undermining: No Wound Description Classification: Full Thickness Without Exposed Support Structure Wound Margin: Flat and Intact Exudate Amount: None Present s Foul Odor After Cleansing: No Slough/Fibrino No Wound Bed Granulation Amount: None Present (0%) Exposed Structure Necrotic Amount: None Present (0%) Fascia Exposed: No Fat Layer (Subcutaneous Tissue) Exposed: No Tendon Exposed: No Muscle Exposed: No Joint Exposed: No Bone Exposed: No Electronic Signature(s) Signed: 09/14/2021 4:47:00 PM By: Levora Dredge Entered By: Levora Dredge on 09/14/2021 09:38:46 [place], [place] ([id number]) -------------------------------------------------------------------------------- Vitals Details Patient Name: Nicolas Cousin L. Date of Service: 09/14/2021 9:00 AM Medical Record Number: [id number] Patient Account Number: [id number] Date of Birth/Sex: [birth year] (51 y.o. M) Treating RN: Levora Dredge Primary Care Junior Kenedy: PATIENT, NO Other Clinician: Referring Charlean Carneal: Barkley Boards Treating Nichlas Pitera/Extender: Tito Dine in Treatment: 35 Vital Signs Time Taken: 09:15 Temperature (F): 98.1 Height (in): 78 Pulse (bpm): 73 Weight (lbs): 249 Respiratory Rate (breaths/min): 18 Body Mass Index (BMI): 28.8 Blood Pressure (mmHg): 142/81 Reference Range: 80 - 120 mg / dl Electronic Signature(s) Signed: 09/14/2021 4:47:00 PM By: Levora Dredge Entered By: Levora Dredge on 09/14/2021 09:16:04

## 2021-09-14 NOTE — Progress Notes (Signed)
[name], [name] ([id number]) Visit Report for 09/14/2021 HPI Details Patient Name: [name], [name]. Date of Service: 09/14/2021 9:00 AM Medical Record Number: [id number] Patient Account Number: [id number] Date of Birth/Sex: 1971-08-20 (51 y.o. M) Treating RN: Levora Dredge Primary Care Provider: PATIENT, NO Other Clinician: Referring Provider: Barkley Boards Treating Provider/Extender: Tito Dine in Treatment: 35 History of Present Illness HPI Description: 01/12/2021 upon evaluation today patient appears to be doing somewhat poorly in regard to a wound on his right medial lower leg. Currently he tells me that this has been going on since around the beginning of April. He has done a telehealth visit with a physician who initially gave him a triamcinolone cream along with Keflex for 5 days. Subsequently he ended up being seen at walk-in urgent care where they also given Keflex for 7 days that seem to be doing better for him. Overall he tells me that things have improved from the standpoint of redness. Fortunately there does not appear to be any signs of systemic infection to be honest right now even locally I do not see any evidence of infection right now. He does have 1 main wound with some scattering areas that look like they may have been small ulcerations but again for the most part these have filled in. This does look almost to be more of a vasculitis type scenario based on what I am seeing. Patient does have chronic venous insufficiency but is not wearing any compression even though it sounds like he has been told before he should. 5/27; patient with small wounds on his right medial ankle. He has chronic venous insufficiency and stasis dermatitis a large open wound and several smaller areas all of which looks better than on presentation last week according to her nursing staff 01/26/2021 upon evaluation today patient appears to be doing well with regard to his wound. He is making good  progress. With that being said there is some need currently for sharp debridement in regard to the wound there is some necrotic debris it is almost completely cleaned off but not completely. He is in agreement with that plan today. Fortunately there does not appear to be any evidence of active infection which is great and I am very pleased in that regard. He still wishes he did not have to wear the compression wrap. He did get his compression socks from [facility] therapy though they are in the 15 to 20 mmHg range they did not have any his length as he is a tall guy in the 20 to 30 mmHg range. 02/02/2021 upon evaluation today patient appears to be doing excellent in regard to his leg ulcer. He has been tolerating the dressing changes without complication. Fortunately there is no evidence of active infection at this time. No fevers, chills, nausea, vomiting, or diarrhea. 02/09/2021 upon evaluation today patient's wound actually showing signs of improvement. Fortunately there does not appear to be any evidence of active infection which is great news overall very pleased with where things stand today. 02/16/2021 upon evaluation today patient's wound is showing some signs of improvement. He did have a fairly aggressive debridement last week he notes that he had quite a bit of discomfort he was some swelling following. With that being said I think this was to be expected considering what we had to do from a debridement standpoint. The good news is there does not appear to be any evidence of infection currently nonetheless I do believe that the patient may have some  signs of vasculitis here. Based on the overall appearance of the wound today and beginning to suspect this. For that reason I think he would benefit from starting him on triamcinolone ointment to the wound bed and some of the immediate periwound to try to help out in this regard. 02/23/2021 upon evaluation today patient appears to be doing well with  regard to his wound. He is actually making some progress here. The wound is roughly about the same size but the overall appearance of the wound bed is much improved. I do not see any signs of active infection at this time which is great news. No fevers, chills, nausea, vomiting, or diarrhea. 03/02/2021 upon evaluation today patient appears to be doing well with regard to his wound. I think the biggest issue I see right now is that with Hydrofera Blue this wound is staying somewhat dry. I think he could benefit from the use of collagen to try to help with getting this to stimulate some additional growth. He is in agreement with the plan. I think this also had a little bit more moisture which would be helpful at this point. 03/09/2021 on evaluation today patient appears to be doing about the same in regard to his wound is measuring a little bit smaller but still he does have the surface of the wound which is not quite as healthy as I would like to see. Fortunately there does not appear to be any active infection at this time. No fevers, chills, nausea, vomiting, or diarrhea. 03/16/2021 upon evaluation today patient appears to be doing better with regard to the overall appearance of his wound bed. There does not appear to be any signs of significant slough buildup I think the Iodoflex has done very well for him. The patient's happy to hear this and see the improvement. With that being said he is continuing to have a little bit of expansion of the wound again I think this is more related to #1 edema control and #2 the fact that again we will get the wound bed clean. I feel like were pretty much today as far as cleaning up the wound bed is concerned now we just need to make sure the edema is optimized. I am just not certain that his compression socks are doing the job. 03/23/2021 upon evaluation today patient unfortunately appears to be doing a little bit worse with regard to his wound I was actually  expecting this to be much better this week. With that being said I am getting concerned about the possibility of pyoderma gangrenosum. I did print off some information for the patient in this regard. Subsequently I am also can see about starting him on some steroid cream as well as an oral steroid. 03/30/2021 upon evaluation today patient appears to be doing significantly better in regard to his wound. He has started using the clobetasol which seems to have made a excellent improvement in regard to the wound today. 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 Either way at this point I would recommend that we avoid sharp debridements I think that is probably can be the best option based on what I am seeing currently. The patient voiced understanding. He also had questions about taking the prednisone with regard to his immune system. I explained that I do  believe that that can be an issue long-term but for short-term which is mainly what we are doing here I do not think it is going to be a major issue for him and in fact I think would help more especially considering the pyoderma here and the fact that is more of an autoimmune type issue than not using the prednisone. AMAN, BONET ([id number]) 04/06/2021 upon evaluation today patient's wound actually showed signs of excellent improvement. This definitely has gone a lot better since we started with the topical steroids and overall I am extremely pleased with where things stand. No fevers, chills, nausea, vomiting, or diarrhea. With that being said the patient continues to feel much better and states that the pain is actually dramatically improved as well. 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 04/13/2021 upon evaluation today patient appears to be doing well with regard to  his wound. Fortunately there does not appear to be any signs of active infection at this time. No fevers, chills, nausea, vomiting, or diarrhea. With that being said I do believe that he is overall doing extremely well. This is measuring smaller and does seem to be headed in the right direction. 04/20/2021 upon evaluation today patient appears to be doing well with regard to his wound. He still has quite a bit of slough covering the surface of the wound. With that being said we previously debrided the wound he actually had a bit of worsening and therefore I am somewhat reluctant to proceed with any further debridement at this point. In fact this got quite a bit larger postdebridement. 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 Nonetheless I think it is can to take time to get this to heal. 04/27/2021 upon evaluation today patient appears to be doing well with regard to his wound. This is measuring smaller this week yet again and overall I am extremely pleased with where we stand currently. Fortunately there does not appear to be any evidence of active infection which is great news. No fevers, chills, nausea, vomiting, or diarrhea. 05/04/2021 upon evaluation today patient's wound actually has a significant amount of biofilm noted on the surface of the wound. Fortunately there does not appear to be any evidence of active infection at this time which is great news and overall I am extremely pleased with where we stand today. However I do think we may need to try to see about a sharp debridement to clear away some of the slough and biofilm on the surface of the wound to try to help this to heal more effectively and quickly. Patient voiced understanding. 05/11/2021 upon evaluation today patient appears to be doing better in regard to his wound. He has been tolerating the dressing changes without complication. Fortunately there does not appear to be any signs of active infection at this time which is  great news. No fever chills noted 05/25/2021 upon evaluation today patient's wound is actually showing signs of excellent improvement. Fortunately there does not appear to be any evidence of infection at this time which is great news and overall I am extremely pleased with where we stand currently. I do feel like that the patient is improving although is very slow where little by little seen in this improvement. 06/01/2021 upon evaluation today patient appears to be doing well with regard to his wound. She has been tolerating the dressing changes without complication. With that being said it somewhat slow to heal at this point. I do think we could  try to switch things up a little bit and see about using collagen in place that what we have been doing. The patient is in agreement with that plan. 06/08/2021 upon evaluation today patient appears to be doing well with regard to his wound I do feel like the collagen is helping with a little additional granulation. Fortunately there does not appear to be any signs of infection which is great news. No fevers, chills, nausea, vomiting, or diarrhea. 06/15/2021 upon evaluation today patient appears to be doing well with regard to his wound. In fact this is showing signs of improvement is going slowly but nonetheless making good progress in my opinion. I do not see any signs of active infection at this time. No fevers, chills, nausea, vomiting, or diarrhea. 06/29/2021 upon evaluation today patient appears to be doing well with regard to his leg ulcer. He has been tolerating the dressing changes without complication. Fortunately there does not appear to be any evidence of active infection which is great and overall I am extremely pleased with where we stand today. He still is using the clobetasol just a small amount over top of the collagen. 07/13/2021 patient's wound bed actually showed signs currently of doing okay there does not appear to be any signs of  infection which is great news. No fevers, chills, nausea, vomiting, or diarrhea. 07/27/2021 upon evaluation today patient's wound is actually showing signs of improvement which is great news. This is measuring smaller significantly and looks much better. I think stopping the clobetasol at this point was a good call. Obviously he is making good progress. The collagen does seem to be doing an awesome job. 08/10/2021 upon evaluation today patient appears to be doing awesome in regard to his wound. He is definitely making some good progress here. I am very pleased with where we stand. 08/31/2021 upon evaluation today patient appears to be doing well with regard to his wound infection this appears to be completely healed which is also news. 1/20; the patient remains completely healed. He wanted to come in to make sure that the area remained healed on the right lower leg this is a venous insufficiency wound. He has 20/30 mm stockings from elastic therapy Electronic Signature(s) Signed: 09/14/2021 3:34:05 PM By: Linton Ham MD Entered By: Linton Ham on 09/14/2021 09:56:56 [place], [place] ([id number]) -------------------------------------------------------------------------------- Physical Exam Details Patient Name: SHAWNDALE, [name]. Date of Service: 09/14/2021 9:00 AM Medical Record Number: [id number] Patient Account Number: [id number] Date of Birth/Sex: September 09, 1970 (51 y.o. M) Treating RN: Levora Dredge Primary Care Provider: PATIENT, NO Other Clinician: Referring Provider: Barkley Boards Treating Provider/Extender: Tito Dine in Treatment: 48 Constitutional Patient is hypertensive.. Pulse regular and within target range for patient.Marland Kitchen Respirations regular, non-labored and within target range.. Temperature is normal and within the target range for the patient.Marland Kitchen appears in no distress. Notes Wound exam; the wound on the right medial lower leg is closed. He has significant  hemosiderin deposition in the area but there is no open wound. His edema control looks fairly good nothing look threatening here. Electronic Signature(s) Signed: 09/14/2021 3:34:05 PM By: Linton Ham MD Entered By: Linton Ham on 09/14/2021 09:57:41 [place], [place] ([id number]) -------------------------------------------------------------------------------- Physician Orders Details Patient Name: PARKE, JANDREAU [name]. Date of Service: 09/14/2021 9:00 AM Medical Record Number: [id number] Patient Account Number: [id number] Date of Birth/Sex: Jan 24, 1971 (51 y.o. M) Treating RN: Levora Dredge Primary Care Provider: PATIENT, NO Other Clinician: Referring Provider: Barkley Boards Treating Provider/Extender: Tito Dine in Treatment:  4 Verbal / Phone Orders: No Diagnosis Coding Follow-up Appointments o Other: - call if any issues with healed wound Bathing/ Shower/ Hygiene o May shower; gently cleanse wound with antibacterial soap, rinse and pat dry prior to dressing wounds o Other: - avoid hot tubs, pools and oceans Edema Control - Lymphedema / Segmental Compressive Device / Other Bilateral Lower Extremities o Patient to wear own compression stockings. Remove compression stockings every night before going to bed and put on every morning when getting up. o Elevate, Exercise Daily and Avoid Standing for Long Periods of Time. o Elevate legs to the level of the heart and pump ankles as often as possible o Elevate leg(s) parallel to the floor when sitting. o DO YOUR BEST to sleep in the bed at night. DO NOT sleep in your recliner. Long hours of sitting in a recliner leads to swelling of the legs and/or potential wounds on your backside. o Other: - Wear protective dressing to healed wound for a week and then may remove. May take off at night and apply AD ointment and reapply protective dressing in the morning. If dressing becomes wet please do change and cover with  protective band aid. May continue to use AD ointment on right lower extremity. Additional Orders / Instructions o Follow Nutritious Diet and Increase Protein Intake Electronic Signature(s) Signed: 09/14/2021 3:34:05 PM By: Linton Ham MD Signed: 09/14/2021 4:47:00 PM By: Levora Dredge Entered By: Levora Dredge on 09/14/2021 09:41:48 [place], [place] ([id number]) -------------------------------------------------------------------------------- Problem List Details Patient Name: DARIK, MASSING [name]. Date of Service: 09/14/2021 9:00 AM Medical Record Number: [id number] Patient Account Number: [id number] Date of Birth/Sex: [birth year] (51 y.o. M) Treating RN: Levora Dredge Primary Care Provider: PATIENT, NO Other Clinician: Referring Provider: Barkley Boards Treating Provider/Extender: Tito Dine in Treatment: 71 Active Problems ICD-10 Encounter Code Description Active Date MDM Diagnosis I87.2 Venous insufficiency (chronic) (peripheral) 01/12/2021 No Yes L97.812 Non-pressure chronic ulcer of other part of right lower leg with fat layer 01/12/2021 No Yes exposed Inactive Problems Resolved Problems Electronic Signature(s) Signed: 09/14/2021 3:34:05 PM By: Linton Ham MD Entered By: Linton Ham on 09/14/2021 09:55:51 [place], [place] ([id number]) -------------------------------------------------------------------------------- Progress Note Details Patient Name: Elliot Cousin [name]. Date of Service: 09/14/2021 9:00 AM Medical Record Number: [id number] Patient Account Number: [id number] Date of Birth/Sex: 09/18/1970 (51 y.o. M) Treating RN: Levora Dredge Primary Care Provider: PATIENT, NO Other Clinician: Referring Provider: Barkley Boards Treating Provider/Extender: Tito Dine in Treatment: 35 Subjective History of Present Illness (HPI) 01/12/2021 upon evaluation today patient appears to be doing somewhat poorly in regard to a wound on his right medial lower  leg. Currently he tells me that this has been going on since around the beginning of April. He has done a telehealth visit with a physician who initially gave him a triamcinolone cream along with Keflex for 5 days. Subsequently he ended up being seen at walk-in urgent care where they also given Keflex for 7 days that seem to be doing better for him. Overall he tells me that things have improved from the standpoint of redness. Fortunately there does not appear to be any signs of systemic infection to be honest right now even locally I do not see any evidence of infection right now. He does have 1 main wound with some scattering areas that look like they may have been small ulcerations but again for the most part these have filled in. This does look almost to be more of a vasculitis type scenario  based on what I am seeing. Patient does have chronic venous insufficiency but is not wearing any compression even though it sounds like he has been told before he should. 5/27; patient with small wounds on his right medial ankle. He has chronic venous insufficiency and stasis dermatitis a large open wound and several smaller areas all of which looks better than on presentation last week according to her nursing staff 01/26/2021 upon evaluation today patient appears to be doing well with regard to his wound. He is making good progress. With that being said there is some need currently for sharp debridement in regard to the wound there is some necrotic debris it is almost completely cleaned off but not completely. He is in agreement with that plan today. Fortunately there does not appear to be any evidence of active infection which is great and I am very pleased in that regard. He still wishes he did not have to wear the compression wrap. He did get his compression socks from elastic therapy though they are in the 15 to 20 mmHg range they did not have any his length as he is a tall guy in the 20 to 30 mmHg  range. 02/02/2021 upon evaluation today patient appears to be doing excellent in regard to his leg ulcer. He has been tolerating the dressing changes without complication. Fortunately there is no evidence of active infection at this time. No fevers, chills, nausea, vomiting, or diarrhea. 02/09/2021 upon evaluation today patient's wound actually showing signs of improvement. Fortunately there does not appear to be any evidence of active infection which is great news overall very pleased with where things stand today. 02/16/2021 upon evaluation today patient's wound is showing some signs of improvement. He did have a fairly aggressive debridement last week he notes that he had quite a bit of discomfort he was some swelling following. With that being said I think this was to be expected considering what we had to do from a debridement standpoint. The good news is there does not appear to be any evidence of infection currently nonetheless I do believe that the patient may have some signs of vasculitis here. Based on the overall appearance of the wound today and beginning to suspect this. For that reason I think he would benefit from starting him on triamcinolone ointment to the wound bed and some of the immediate periwound to try to help out in this regard. 02/23/2021 upon evaluation today patient appears to be doing well with regard to his wound. He is actually making some progress here. The wound is roughly about the same size but the overall appearance of the wound bed is much improved. I do not see any signs of active infection at this time which is great news. No fevers, chills, nausea, vomiting, or diarrhea. 03/02/2021 upon evaluation today patient appears to be doing well with regard to his wound. I think the biggest issue I see right now is that with Hydrofera Blue this wound is staying somewhat dry. I think he could benefit from the use of collagen to try to help with getting this to stimulate some  additional growth. He is in agreement with the plan. I think this also had a little bit more moisture which would be helpful at this point. 03/09/2021 on evaluation today patient appears to be doing about the same in regard to his wound is measuring a little bit smaller but still he does have the surface of the wound which is not quite as healthy  as I would like to see. Fortunately there does not appear to be any active infection at this time. No fevers, chills, nausea, vomiting, or diarrhea. 03/16/2021 upon evaluation today patient appears to be doing better with regard to the overall appearance of his wound bed. There does not appear to be any signs of significant slough buildup I think the Iodoflex has done very well for him. The patient's happy to hear this and see the improvement. With that being said he is continuing to have a little bit of expansion of the wound again I think this is more related to #1 edema control and #2 the fact that again we will get the wound bed clean. I feel like were pretty much today as far as cleaning up the wound bed is concerned now we just need to make sure the edema is optimized. I am just not certain that his compression socks are doing the job. 03/23/2021 upon evaluation today patient unfortunately appears to be doing a little bit worse with regard to his wound I was actually expecting this to be much better this week. With that being said I am getting concerned about the possibility of pyoderma gangrenosum. I did print off some information for the patient in this regard. Subsequently I am also can see about starting him on some steroid cream as well as an oral steroid. 03/30/2021 upon evaluation today patient appears to be doing significantly better in regard to his wound. He has started using the clobetasol which seems to have made a excellent improvement in regard to the wound today. 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 Either way at this point I would recommend that we avoid sharp debridements I think that is probably can be the best option based on what I am seeing currently. The patient voiced understanding. He also had questions about taking the prednisone with regard to his immune system. I explained that I do believe that that can be an issue long-term but for short-term which is mainly what we are doing here I do not think it is going to be a major issue for him and in fact I think would help more especially considering the pyoderma here and the fact that is more of an autoimmune type issue than not using the prednisone. 04/06/2021 upon evaluation today patient's wound actually showed signs of excellent improvement. This definitely has gone a lot better since we started with the topical steroids and overall I am extremely pleased with where things stand. No fevers, chills, nausea, vomiting, or diarrhea. With that being said the patient continues to feel much better and states that the pain is actually dramatically improved as well. 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 04/13/2021 upon evaluation today patient appears to be doing well with regard to his wound. Fortunately there does not appear to be any signs of active infection at this time. No fevers, chills, nausea, vomiting, or diarrhea. With that being said I do believe that he is overall doing extremely well. This is measuring smaller and does seem to be headed in the right direction. 04/20/2021 upon evaluation today patient appears to be doing well with regard to his wound. He still has quite a bit of slough covering the surface of the wound. With that  being said we previously debrided the wound he actually had a bit of worsening and therefore I am somewhat reluctant to proceed  with any further debridement at this point. In fact this got quite a bit larger postdebridement. 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 Nonetheless I think it is can to take time to get this to heal. 04/27/2021 upon evaluation today patient appears to be doing well with regard to his wound. This is measuring smaller this week yet again and overall I am extremely pleased with where we stand currently. Fortunately there does not appear to be any evidence of active infection which is great news. No fevers, chills, nausea, vomiting, or diarrhea. 05/04/2021 upon evaluation today patient's wound actually has a significant amount of biofilm noted on the surface of the wound. Fortunately there does not appear to be any evidence of active infection at this time which is great news and overall I am extremely pleased with where we stand today. However I do think we may need to try to see about a sharp debridement to clear away some of the slough and biofilm on the surface of the wound to try to help this to heal more effectively and quickly. Patient voiced understanding. 05/11/2021 upon evaluation today patient appears to be doing better in regard to his wound. He has been tolerating the dressing changes without complication. Fortunately there does not appear to be any signs of active infection at this time which is great news. No fever chills noted 05/25/2021 upon evaluation today patient's wound is actually showing signs of excellent improvement. Fortunately there does not appear to be any evidence of infection at this time which is great news and overall I am extremely pleased with where we stand currently. I do feel like that the patient is improving although is very slow where little by little seen in this improvement. 06/01/2021 upon evaluation today patient appears to be doing well with regard to his wound. She has been tolerating the dressing changes without complication. With that being said it  somewhat slow to heal at this point. I do think we could try to switch things up a little bit and see about using collagen in place that what we have been doing. The patient is in agreement with that plan. 06/08/2021 upon evaluation today patient appears to be doing well with regard to his wound I do feel like the collagen is helping with a little additional granulation. Fortunately there does not appear to be any signs of infection which is great news. No fevers, chills, nausea, vomiting, or diarrhea. 06/15/2021 upon evaluation today patient appears to be doing well with regard to his wound. In fact this is showing signs of improvement is going slowly but nonetheless making good progress in my opinion. I do not see any signs of active infection at this time. No fevers, chills, nausea, vomiting, or diarrhea. 06/29/2021 upon evaluation today patient appears to be doing well with regard to his leg ulcer. He has been tolerating the dressing changes without complication. Fortunately there does not appear to be any evidence of active infection which is great and overall I am extremely pleased with where we stand today. He still is using the clobetasol just a small amount over top of the collagen. 07/13/2021 patient's wound bed actually showed signs currently of doing okay there does not appear to be any signs of infection which is great news. No fevers, chills, nausea, vomiting, or diarrhea. 07/27/2021 upon evaluation today patient's  wound is actually showing signs of improvement which is great news. This is measuring smaller significantly and looks much better. I think stopping the clobetasol at this point was a good call. Obviously he is making good progress. The collagen does seem to be doing an awesome job. 08/10/2021 upon evaluation today patient appears to be doing awesome in regard to his wound. He is definitely making some good progress here. I am very pleased with where we stand. 08/31/2021 upon  evaluation today patient appears to be doing well with regard to his wound infection this appears to be completely healed which is also news. 1/20; the patient remains completely healed. He wanted to come in to make sure that the area remained healed on the right lower leg this is a venous insufficiency wound. He has 20/30 mm stockings from elastic therapy Objective Constitutional Patient is hypertensive.. Pulse regular and within target range for patient.Marland Kitchen Respirations regular, non-labored and within target range.. Temperature is normal and within the target range for the patient.Marland Kitchen appears in no distress. Vitals Time Taken: 9:15 AM, Height: 78 in, Weight: 249 lbs, BMI: 28.8, Temperature: 98.1 F, Pulse: 73 bpm, Respiratory Rate: 18 breaths/min, Blood Pressure: 142/81 mmHg. General Notes: Wound exam; the wound on the right medial lower leg is closed. He has significant hemosiderin deposition in the area but there is DINGEE, Nelson ([id number]) no open wound. His edema control looks fairly good nothing look threatening here. Integumentary (Hair, Skin) Wound #1 status is Healed - Epithelialized. Original cause of wound was Gradually Appeared. The date acquired was: 11/24/2020. The wound has been in treatment 35 weeks. The wound is located on the Right,Medial Lower Leg. The wound measures 0cm length x 0cm width x 0cm depth; 0cm^2 area and 0cm^3 volume. There is no tunneling or undermining noted. There is a none present amount of drainage noted. The wound margin is flat and intact. There is no granulation within the wound bed. There is no necrotic tissue within the wound bed. Assessment Active Problems ICD-10 Venous insufficiency (chronic) (peripheral) Non-pressure chronic ulcer of other part of right lower leg with fat layer exposed Plan Follow-up Appointments: Other: - call if any issues with healed wound Bathing/ Shower/ Hygiene: May shower; gently cleanse wound with antibacterial soap,  rinse and pat dry prior to dressing wounds Other: - avoid hot tubs, pools and oceans Edema Control - Lymphedema / Segmental Compressive Device / Other: Patient to wear own compression stockings. Remove compression stockings every night before going to bed and put on every morning when getting up. Elevate, Exercise Daily and Avoid Standing for Long Periods of Time. Elevate legs to the level of the heart and pump ankles as often as possible Elevate leg(s) parallel to the floor when sitting. DO YOUR BEST to sleep in the bed at night. DO NOT sleep in your recliner. Long hours of sitting in a recliner leads to swelling of the legs and/or potential wounds on your backside. Other: - Wear protective dressing to healed wound for a week and then may remove. May take off at night and apply AD ointment and reapply protective dressing in the morning. If dressing becomes wet please do change and cover with protective band aid. May continue to use AD ointment on right lower extremity. Additional Orders / Instructions: Follow Nutritious Diet and Increase Protein Intake 1. The patient can be discharged. He has his compression stockings and instructions to lubricate this problematic area on the medial right lower leg with a skin moisturizer  Electronic Signature(s) Signed: 09/14/2021 3:34:05 PM By: Linton Ham MD Entered By: Linton Ham on 09/14/2021 09:58:17 [place], [place] ([id number]) -------------------------------------------------------------------------------- [place] Details Patient Name: TERRENCE, [name]. Date of Service: 09/14/2021 Medical Record Number: [id number] Patient Account Number: [id number] Date of Birth/Sex: [birth year] (51 y.o. M) Treating RN: Levora Dredge Primary Care Provider: PATIENT, NO Other Clinician: Referring Provider: Barkley Boards Treating Provider/Extender: Tito Dine in Treatment: 35 Diagnosis Coding ICD-10 Codes Code Description I87.2 Venous  insufficiency (chronic) (peripheral) L97.812 Non-pressure chronic ulcer of other part of right lower leg with fat layer exposed Facility Procedures CPT4 Code: [id number] Description: [phone] - WOUND CARE VISIT-LEV 2 EST PT Modifier: Quantity: 1 Physician Procedures CPT4 Code: [id number] Description: 83729 - WC PHYS LEVEL 2 - EST PT Modifier: Quantity: 1 CPT4 Code: Description: ICD-10 Diagnosis Description I87.2 Venous insufficiency (chronic) (peripheral) L97.812 Non-pressure chronic ulcer of other part of right lower leg with fat la Modifier: yer exposed Quantity: Electronic Signature(s) Signed: 09/14/2021 3:34:05 PM By: Linton Ham MD Entered By: Linton Ham on 09/14/2021 09:58:30

## 2022-07-20 ENCOUNTER — Telehealth: Payer: Self-pay | Admitting: Urgent Care

## 2022-07-20 DIAGNOSIS — L03115 Cellulitis of right lower limb: Secondary | ICD-10-CM

## 2022-07-20 DIAGNOSIS — L309 Dermatitis, unspecified: Secondary | ICD-10-CM

## 2022-07-20 MED ORDER — CEPHALEXIN 500 MG PO CAPS: 500.0000 mg | ORAL_CAPSULE | Freq: Four times a day (QID) | ORAL | 0 refills | Status: AC

## 2022-07-20 MED ORDER — CLOTRIMAZOLE-BETAMETHASONE 1-0.05 % EX CREA: 1.0000 | TOPICAL_CREAM | Freq: Every day | CUTANEOUS | 0 refills | Status: AC

## 2022-07-20 NOTE — Progress Notes (Signed)
Virtual Visit Consent   [name], you are scheduled for a virtual visit with a [facility] provider today. Just as with appointments in the office, your consent must be obtained to participate. Your consent will be active for this visit and any virtual visit you may have with one of our providers in the next 365 days. If you have a MyChart account, a copy of this consent can be sent to you electronically.  As this is a virtual visit, video technology does not allow for your provider to perform a traditional examination. This may limit your provider's ability to fully assess your condition. If your provider identifies any concerns that need to be evaluated in person or the need to arrange testing (such as labs, EKG, etc.), we will make arrangements to do so. Although advances in technology are sophisticated, we cannot ensure that it will always work on either your end or our end. If the connection with a video visit is poor, the visit may have to be switched to a telephone visit. With either a video or telephone visit, we are not always able to ensure that we have a secure connection.  By engaging in this virtual visit, you consent to the provision of healthcare and authorize for your insurance to be billed (if applicable) for the services provided during this visit. Depending on your insurance coverage, you may receive a charge related to this service.  I need to obtain your verbal consent now. Are you willing to proceed with your visit today? [name] has provided verbal consent on 07/20/2022 for a virtual visit (video or telephone). Chaney Malling, PA  Date: 07/20/2022 10:15 AM  Virtual Visit via Video Note   I, [name], connected with  [name]  ([id number], Nov 15, 1970) on 07/20/22 at  9:15 AM EST by a video-enabled telemedicine application and verified that I am speaking with the correct person using two identifiers.  Location: Patient: Virtual Visit Location Patient:  Home Provider: Virtual Visit Location Provider: Home Office   I discussed the limitations of evaluation and management by telemedicine and the availability of in person appointments. The patient expressed understanding and agreed to proceed.    History of Present Illness: [name] is a 51 y.o. who identifies as a male who was assigned male at birth, and is being seen today for right leg rash.  HPI: Pleasant 51yo male presents today with concern of dry, itchy skin to his R lower extremity. States that on 04/23/22 he was dx with cellulitis of his R leg. Went through 2 rounds of abx between August and Sept. States that the infection had gone away completely, but over the past 1 week, feels that it may be coming back. 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 States that the skin is slightly darker, and very flaky/ itchy. He does not smoke, denies hx of PVD. No hx of DM. States there is no evidence of fungus between his toes, and no cracks/ fissures on his feet or heels. States comparatively to his infection in August, he feels well, just hoped for medication to prevent it from becoming severe again. Denies fever or any systemic symptoms. Has sensation to his feet. No hx of abx allergies.  Problems: There are no problems to display for this patient.   Allergies:  No Known Allergies Medications:  Current Outpatient Medications:    cephALEXin (KEFLEX) 500 MG capsule, Take 1 capsule (500 mg total) by mouth 4 (four) times daily for 7 days., Disp: 28 capsule, Rfl: 0   clotrimazole-betamethasone (LOTRISONE) cream, Apply 1 Application topically daily., Disp: 30 g, Rfl: 0   diazepam (VALIUM) 2 MG tablet, Take 1 tablet (2 mg total) by mouth every 8 (eight) hours as needed for muscle spasms., Disp: 9 tablet, Rfl: 0   ibuprofen  (ADVIL,MOTRIN) 800 MG tablet, Take 1 tablet (800 mg total) by mouth every 8 (eight) hours as needed for moderate pain., Disp: 15 tablet, Rfl: 0   oxyCODONE-acetaminophen (ROXICET) 5-325 MG tablet, Take 1 tablet by mouth every 4 (four) hours as needed for severe pain., Disp: 15 tablet, Rfl: 0  Observations/Objective: Patient is well-developed, well-nourished in no acute distress.  Resting comfortably NAD at home. Non-toxic appearing. Head is normocephalic, atraumatic.  No labored breathing.  Speech is clear and coherent with logical content.  Patient is alert and oriented at baseline.  No swelling noted to distal extremity. Mid shin on R lower extremity appears very dry and scaling, hyperpigmented. Minimal erythema surrounding. Pt able to squeeze calf/ thigh without tenderness. Negative homan sign. Dry, calloused heel otherwise normal appearing foot.   Assessment and Plan: 1. Cellulitis of right lower extremity  2. Dermatitis  Pt appears to have a recurring cellulitis to the R lower extremity. Pt admits sx are similar to previous but much less severe. Pt fully ambulatory, no pain or swelling. No fever or systemic symptoms. Will start cephalexin QID x 7 days. Will also add topical lotrisone for the skin. I question if this is a post-infectious dermatitis. Encouraged pt to schedule an in person follow up within the coming week, if any new or worsening symptoms, head to UC or ER.  Follow Up Instructions: I discussed the assessment and treatment plan with the patient. The patient was provided an opportunity to ask questions and all were answered. The patient agreed with the plan and demonstrated an understanding of the instructions.  A copy of instructions were sent to the patient via MyChart unless otherwise noted below.    The patient was advised to call back or seek an in-person evaluation if the symptoms worsen or if the condition fails to improve as anticipated.  Time:  I spent 10 minutes  with the patient via telehealth technology discussing the above problems/concerns.    [place], PA

## 2022-07-20 NOTE — Patient Instructions (Signed)
  [name], thank you for joining Chaney Malling, PA for today's virtual visit.  While this provider is not your primary care provider (PCP), if your PCP is located in our provider database this encounter information will be shared with them immediately following your visit.   [facility] account gives you access to today's visit and all your visits, tests, and labs performed at [facility] " click here if you don't have a [facility] account or go to [email or web address]  Consent: (Patient) [name] provided verbal consent for this virtual visit at the beginning of the encounter.  Current Medications:  Current Outpatient Medications:    cephALEXin (KEFLEX) 500 MG capsule, Take 1 capsule (500 mg total) by mouth 4 (four) times daily for 7 days., Disp: 28 capsule, Rfl: 0   clotrimazole-betamethasone (LOTRISONE) cream, Apply 1 Application topically daily., Disp: 30 g, Rfl: 0   diazepam (VALIUM) 2 MG tablet, Take 1 tablet (2 mg total) by mouth every 8 (eight) hours as needed for muscle spasms., Disp: 9 tablet, Rfl: 0   ibuprofen (ADVIL,MOTRIN) 800 MG tablet, Take 1 tablet (800 mg total) by mouth every 8 (eight) hours as needed for moderate pain., Disp: 15 tablet, Rfl: 0   oxyCODONE-acetaminophen (ROXICET) 5-325 MG tablet, Take 1 tablet by mouth every 4 (four) hours as needed for severe pain., Disp: 15 tablet, Rfl: 0   Medications ordered in this encounter:  Meds ordered this encounter  Medications   clotrimazole-betamethasone (LOTRISONE) cream    Sig: Apply 1 Application topically daily.    Dispense:  30 g    Refill:  0    Order Specific Question:   Supervising Provider    Answer:   Chase Picket [[id number]]   cephALEXin (KEFLEX) 500 MG capsule    Sig: Take 1 capsule (500 mg total) by mouth 4 (four) times daily for 7 days.    Dispense:  28 capsule    Refill:  0    Order Specific Question:   Supervising Provider    Answer:   Chase Picket  [[id number]]     *If you need refills on other medications prior to your next appointment, please contact your pharmacy*  Follow-Up: Call back or seek an in-person evaluation if the symptoms worsen or if the condition fails to improve as anticipated.  [facility] [phone]  Other Instructions Please start taking the cephalexin by mouth four times daily to help prevent cellulitis. Please apply the topical cream once daily to the affected area of your right shin. Please call your PCP and schedule a follow up in the next 5-7 days. If you develop any pain, swelling, fever, or difficulty walking, please go straight to the Emergency Room.   If you have been instructed to have an in-person evaluation today at a local Urgent Care facility, please use the link below. It will take you to a list of all of our available [facility], including address, phone number and hours of operation. Please do not delay care.  [facility]  If you or a family member do not have a primary care provider, use the link below to schedule a visit and establish care. When you choose a [facility] primary care physician or advanced practice provider, you gain a long-term partner in health. Find a Primary Care Provider  Learn more about [facility]'s in-office and virtual care options: [facility]

## 2023-11-07 NOTE — Progress Notes (Signed)
 " Subjective:    Chief Complaint  Patient presents with   Laceration    Today right before arrival  Hand went through shop showroom glass  R hand laceration on wrist R posterior forearm laceration approx 2 inch long to subcutaneous, R triceps region laceration approx 3 in long superficial, not to subq, L hand ring finger over distal joint lac less than 1 cm  No tetanus on file    History was provided by the patient. [name] is a 53 y.o., male  who presents with accidentally shattering glass sliding door while placing back on track while at work, with subsequent 0.5 cm laceration to his left fourth finger, extensor surface of his distal phalanx.  He has a 0.5 cm laceration to his right volar wrist.  He has a 4 cm gaping wound to his right forearm, ulnar side.  Lastly he has a 5 cm superficial wound to his right medial biceps.  There is no active bleeding.  Wounds are all well-approximated with the exception of the right mid forearm.  Patient is overdue for tetanus.  Denies other injury.  No complaints of headache or dizziness; no sore throat, cough, eye injury; no chest pain or shortness of breath; no nausea, vomiting, diarrhea; no fevers or chills.  Applied direct pressure prior to arrival. Symptoms include: above   Patient denies: above Treatment to date: above   No past medical history on file. The following portions of the patient's history were reviewed and updated as appropriate: allergies, current medications, past social history, and problem list.  Review of Systems A complete review of systems was performed.  Positive and pertinent negative responses are documented in the HPI, and all other systems are negative.   Objective:     Vitals:   11/07/23 1531  BP: 128/77  Pulse: 64  Temp: 36.4 C (97.5 F)  TempSrc: Oral  SpO2: 97%  Weight: (!) 106.6 kg (235 lb)  Height: 198.1 cm (6' 6)  PainSc:   2  PainLoc: Arm    General Appearance:  Well-developed, well-nourished.   Extremely pleasant and cooperative.  No acute distress. HEENT:  [place]/AT, PERRLA, EOMI, sclera clear, conjunctivae pink.   Neck:  Supple, no JVD or adenopathy. Cardiovascular:  Regular rate and rhythm.  Lungs:  Clear to auscultation.  Abdomen: Benign. Extremities: Unremarkable except left fourth finger-0.5 cm superficial laceration extensor surface distal phalanx as above.  Right upper extremity-0.5 cm superficial wound right volar wrist-well-approximated.  Right mid forearm, ulnar side, with 4 cm gaping wound-no active bleeding.  No swelling, ecchymosis, erythema.  No foreign body appreciated.  Right medial biceps region-with 5 cm superficial wound-well-approximated.  No active bleeding.  No swelling, ecchymosis, erythema. Neuro:  Alert and orient x4; nonfocal.    Lab/X-ray/Treatments:   Wound of right medial forearm was cleaned and prepped in sterile fashion.  Anesthetized using 2 cc of 2% plain lidocaine.  Wound was reapproximated using 6 5-0 nylon with good result.  Patient tolerated well.  Tdap-updated  Remainder of the wounds were cleaned, antibiotic ointment and sterile dressings applied.       Assessment:      1. Arm injury, right, initial encounter  2. Hand injury, left, initial encounter  3. Open wound of right forearm, initial encounter -     Tdap (>=66YR) vaccine (Adacel/Boostrix)(No Medicare Patients) -     cefadroxil (DURICEF) 500 mg capsule; Take 1 capsule (500 mg total) by mouth 2 (two) times daily for 10 days  Dispense:  20 capsule; Refill: 0   Multiple superficial wounds, 4 cm laceration right mid forearm repaired using 5-0 nylon, 6 interrupted sutures, with good result.  Patient tolerated well.  Tdap updated.  Prophylaxis cefadroxil.  Wound care instructions provided.  Return here in 7 to 10 days for suture removal.  Call or return sooner if any problems or concerns.    Plan:   Requested Prescriptions   Signed Prescriptions Disp Refills   cefadroxil (DURICEF) 500  mg capsule 20 capsule 0    Sig: Take 1 capsule (500 mg total) by mouth 2 (two) times daily for 10 days    "

## 2023-11-17 NOTE — Progress Notes (Signed)
 " Subjective:    Chief Complaint  Patient presents with   Suture / Staple Removal    X pateint was seen 3/14 for laceration to Right arm and had sutures placed   Patient states he has been cleaning and dressing his wounds daily and took his meds as prescribed  Pt states the small cut on his R wrist was hurting recently so he checked and was able to get a piece of glass out from there yesterday   States forearm laceration hurts 2/10 just a little bit     History was provided by the patient. [name] is a 53 y.o., male  who presents for follow-up of right arm injury which occurred 10 days prior.  Patient had accidentally shattered a glass door, suffering multiple minor lacerations to his right upper extremity, including one 4 cm laceration to his forearm.  This particular laceration was sutured, placed on cefadroxil for prophylaxis.  The sutured wound has done very well, appears well-healed, without signs or symptoms of infection.  He had a superficial laceration to his right medial biceps region, which is also healing well.  No evidence of infection.  Patient reports he had a 0.5 cm wound to his right volar wrist, which was causing him some discomfort, and he ultimately expressed a small piece of glass from the site.  Reports symptoms have essentially resolved.  He does have a 0.5 cm indurated site over his right volar wrist.  Little to no tenderness.  Denies other complaints. Symptoms include: above   Patient denies: above Treatment to date: above   No past medical history on file. The following portions of the patient's history were reviewed and updated as appropriate: allergies, current medications, past social history, and problem list.  Review of Systems A complete review of systems was performed.  Positive and pertinent negative responses are documented in the HPI, and all other systems are negative.   Objective:     Vitals:   11/17/23 1059  BP: 134/85  Pulse: 75  Temp: 36.5  C (97.7 F)  TempSrc: Oral  SpO2: 98%  Weight: (!) 113 kg (249 lb 3.2 oz)  Height: 198.1 cm (6' 6)  PainSc:   2  PainLoc: Arm    General Appearance:  Well-developed, well-nourished.  Very pleasant and cooperative.  No acute distress. HEENT:  Cole/AT, PERRLA, EOMI, sclera clear, conjunctivae pink.   Neck:  Supple, no JVD or adenopathy. Cardiovascular:  Regular rate and rhythm.  Lungs:  Clear to auscultation.  Abdomen: Benign. Extremities: Right upper extremity-right forearm-with well-healed sutured wound.  Right medial biceps region with essentially healed superficial laceration.  Right medial wrist-with 0.5 cm firm linear scar versus other?  Little to no tenderness.  Neurovasc intact. Neuro:  Alert and orient x4; nonfocal.    Lab/X-ray/Treatments:   Right wrist-no visible foreign body.       Assessment:      1. Injury of right wrist, subsequent encounter -     X-ray wrist right 3 plus views; Future -     cefadroxil (DURICEF) 500 mg capsule; Take 1 capsule (500 mg total) by mouth 2 (two) times daily for 7 days  Dispense: 14 capsule; Refill: 0  2. Arm injury, right, subsequent encounter -     cefadroxil (DURICEF) 500 mg capsule; Take 1 capsule (500 mg total) by mouth 2 (two) times daily for 7 days  Dispense: 14 capsule; Refill: 0   Sutures removed, sterile dressing placed.  Will extend antibiotics for  an additional week.  Advised patient if he should have persistent symptoms of his right volar wrist, to call for referral to orthopedics (Dr. Chyrl Maltos?).  Work note provided.  Call return if any further problems.    Plan:   Requested Prescriptions   Signed Prescriptions Disp Refills   cefadroxil (DURICEF) 500 mg capsule 14 capsule 0    Sig: Take 1 capsule (500 mg total) by mouth 2 (two) times daily for 7 days    "

## 2023-12-21 NOTE — Result Encounter Note (Signed)
 Continue same instruction.

## 2024-09-07 ENCOUNTER — Telehealth: Payer: Self-pay | Admitting: General Practice

## 2024-09-07 NOTE — Telephone Encounter (Signed)
 Copied from CRM #[id number]. Topic: Appointments - Scheduling Inquiry for Clinic >> Sep 06, 2024  5:02 PM China J wrote: Reason for CRM: Patient was referred to Dr. Cleatus by Dr. Andre over at wound care. The patient was wondering if Dr. Cleatus could make the exception of seeing him as a new patient.  Please call [phone] for an update.

## 2024-09-08 NOTE — Telephone Encounter (Signed)
 I would like to help this gentleman out and I appreciate the referral but I cannot take on extra patients at this point due to my panel size.  Please see if he can see anyone else in the clinic.  Thanks.

## 2024-09-08 NOTE — Telephone Encounter (Signed)
 Left message to return call to office. Ok to [employer] below.

## 2024-09-09 NOTE — Telephone Encounter (Signed)
 I called and left a voicemaill.
